# Patient Record
Sex: Female | Born: 1975 | Race: Asian | Hispanic: No | Marital: Single | Smoking: Never smoker
Health system: Southern US, Community
[De-identification: ages and names within clinical notes are randomized; demographics above are authoritative.]

## PROBLEM LIST (undated history)

## (undated) DIAGNOSIS — R569 Unspecified convulsions: Secondary | ICD-10-CM

## (undated) DIAGNOSIS — Q909 Down syndrome, unspecified: Secondary | ICD-10-CM

## (undated) HISTORY — DX: Unspecified convulsions: R56.9

---

## 2014-01-22 ENCOUNTER — Ambulatory Visit: Payer: Self-pay | Admitting: Internal Medicine

## 2016-01-06 DIAGNOSIS — Z0000 Encounter for general adult medical examination without abnormal findings: Secondary | ICD-10-CM | POA: Insufficient documentation

## 2018-03-23 ENCOUNTER — Other Ambulatory Visit: Payer: Self-pay | Admitting: Obstetrics and Gynecology

## 2018-03-23 DIAGNOSIS — Z1231 Encounter for screening mammogram for malignant neoplasm of breast: Secondary | ICD-10-CM

## 2018-04-14 ENCOUNTER — Ambulatory Visit
Admission: RE | Admit: 2018-04-14 | Discharge: 2018-04-14 | Disposition: A | Discharge status: Home / Self Care | Payer: Medicare Other | Source: Ambulatory Visit | Attending: Obstetrics and Gynecology | Admitting: Obstetrics and Gynecology

## 2018-04-14 ENCOUNTER — Encounter: Payer: Self-pay | Admitting: Radiology

## 2018-04-14 DIAGNOSIS — Z1231 Encounter for screening mammogram for malignant neoplasm of breast: Secondary | ICD-10-CM

## 2019-12-14 ENCOUNTER — Ambulatory Visit: Payer: Medicare Other | Attending: Internal Medicine

## 2019-12-14 DIAGNOSIS — Z23 Encounter for immunization: Secondary | ICD-10-CM

## 2019-12-14 NOTE — Progress Notes (Signed)
   Covid-19 Vaccination Clinic  Name:  Anna Banks    MRN: [id number] DOB: 08/11/1976  12/14/2019  [name] was observed post Covid-19 immunization for 15 minutes without incident. She was provided with Vaccine Information Sheet and instruction to access the V-Safe system.   [name] was instructed to call 911 with any severe reactions post vaccine: Marland Kitchen Difficulty breathing  . Swelling of face and throat  . A fast heartbeat  . A bad rash all over body  . Dizziness and weakness   Immunizations Administered    Name Date Dose VIS Date Route   Pfizer COVID-19 Vaccine 12/14/2019 10:47 AM 0.3 mL 08/24/2019 Intramuscular   Manufacturer: [place]   Lot: [id number]   [place]: [id number]

## 2020-01-08 ENCOUNTER — Ambulatory Visit: Payer: Medicare Other | Attending: Internal Medicine

## 2020-01-08 DIAGNOSIS — Z23 Encounter for immunization: Secondary | ICD-10-CM

## 2020-01-08 NOTE — Progress Notes (Signed)
   Covid-19 Vaccination Clinic  Name:  Anna Banks    MRN: [id number] DOB: 26-Mar-1976  01/08/2020  Anna Banks was observed post Covid-19 immunization for 15 minutes without incident. She was provided with Vaccine Information Sheet and instruction to access the V-Safe system.   Anna Banks was instructed to call 911 with any severe reactions post vaccine: Marland Kitchen Difficulty breathing  . Swelling of face and throat  . A fast heartbeat  . A bad rash all over body  . Dizziness and weakness   Immunizations Administered    Name Date Dose VIS Date Route   Pfizer COVID-19 Vaccine 01/08/2020 12:01 PM 0.3 mL 11/07/2018 Intramuscular   Manufacturer: [place]   Lot: [id number]   [place]: [id number]

## 2021-02-11 ENCOUNTER — Other Ambulatory Visit: Payer: Self-pay | Admitting: Internal Medicine

## 2021-02-11 DIAGNOSIS — Z1231 Encounter for screening mammogram for malignant neoplasm of breast: Secondary | ICD-10-CM

## 2021-02-16 ENCOUNTER — Other Ambulatory Visit: Payer: Self-pay

## 2021-02-16 ENCOUNTER — Ambulatory Visit
Admission: RE | Admit: 2021-02-16 | Discharge: 2021-02-16 | Disposition: A | Discharge status: Home / Self Care | Payer: Medicare Other | Source: Ambulatory Visit | Attending: Internal Medicine | Admitting: Internal Medicine

## 2021-02-16 DIAGNOSIS — Z1231 Encounter for screening mammogram for malignant neoplasm of breast: Secondary | ICD-10-CM | POA: Insufficient documentation

## 2021-02-23 ENCOUNTER — Other Ambulatory Visit: Payer: Self-pay | Admitting: Internal Medicine

## 2021-02-23 DIAGNOSIS — R928 Other abnormal and inconclusive findings on diagnostic imaging of breast: Secondary | ICD-10-CM

## 2021-02-23 DIAGNOSIS — N6320 Unspecified lump in the left breast, unspecified quadrant: Secondary | ICD-10-CM

## 2021-02-23 DIAGNOSIS — R921 Mammographic calcification found on diagnostic imaging of breast: Secondary | ICD-10-CM

## 2021-02-26 ENCOUNTER — Ambulatory Visit
Admission: RE | Admit: 2021-02-26 | Discharge: 2021-02-26 | Disposition: A | Discharge status: Home / Self Care | Payer: Medicare Other | Source: Ambulatory Visit | Attending: Internal Medicine | Admitting: Internal Medicine

## 2021-02-26 ENCOUNTER — Other Ambulatory Visit: Payer: Self-pay

## 2021-02-26 DIAGNOSIS — R928 Other abnormal and inconclusive findings on diagnostic imaging of breast: Secondary | ICD-10-CM | POA: Diagnosis not present

## 2021-02-26 DIAGNOSIS — N6320 Unspecified lump in the left breast, unspecified quadrant: Secondary | ICD-10-CM | POA: Insufficient documentation

## 2021-02-26 DIAGNOSIS — R921 Mammographic calcification found on diagnostic imaging of breast: Secondary | ICD-10-CM | POA: Insufficient documentation

## 2021-03-03 ENCOUNTER — Other Ambulatory Visit: Payer: Self-pay | Admitting: Internal Medicine

## 2021-03-03 DIAGNOSIS — N6320 Unspecified lump in the left breast, unspecified quadrant: Secondary | ICD-10-CM

## 2021-03-03 DIAGNOSIS — R928 Other abnormal and inconclusive findings on diagnostic imaging of breast: Secondary | ICD-10-CM

## 2021-03-10 ENCOUNTER — Ambulatory Visit
Admission: RE | Admit: 2021-03-10 | Discharge: 2021-03-10 | Disposition: A | Discharge status: Home / Self Care | Payer: Medicare Other | Source: Ambulatory Visit | Attending: Internal Medicine | Admitting: Internal Medicine

## 2021-03-10 ENCOUNTER — Other Ambulatory Visit: Payer: Self-pay

## 2021-03-10 DIAGNOSIS — N6320 Unspecified lump in the left breast, unspecified quadrant: Secondary | ICD-10-CM | POA: Diagnosis present

## 2021-03-10 DIAGNOSIS — R928 Other abnormal and inconclusive findings on diagnostic imaging of breast: Secondary | ICD-10-CM

## 2021-03-10 HISTORY — PX: BREAST BIOPSY: SHX20

## 2021-03-17 ENCOUNTER — Other Ambulatory Visit: Payer: Self-pay

## 2021-03-17 DIAGNOSIS — C50919 Malignant neoplasm of unspecified site of unspecified female breast: Secondary | ICD-10-CM

## 2021-03-17 LAB — SURGICAL PATHOLOGY

## 2021-03-17 NOTE — Progress Notes (Signed)
Navigation initiated with patient and sister, Suanne Marker.  Scheduled for consult with Dr. Lysle Pearl, and Dr. Tasia Catchings 03/23/21.  Per Electa Sniff at [facility] patient has Down's Syndrome.  Also reports patient has [place].

## 2021-03-23 ENCOUNTER — Ambulatory Visit: Payer: Self-pay | Admitting: Surgery

## 2021-03-23 ENCOUNTER — Inpatient Hospital Stay: Payer: Medicare Other | Attending: Oncology | Admitting: Oncology

## 2021-03-23 ENCOUNTER — Encounter: Payer: Self-pay | Admitting: *Deleted

## 2021-03-23 ENCOUNTER — Encounter: Payer: Self-pay | Admitting: Oncology

## 2021-03-23 ENCOUNTER — Other Ambulatory Visit: Payer: Self-pay | Admitting: Surgery

## 2021-03-23 ENCOUNTER — Inpatient Hospital Stay: Payer: Medicare Other

## 2021-03-23 VITALS — BP 111/72 | HR 73 | Temp 97.9°F | Resp 18 | Wt 119.4 lb

## 2021-03-23 DIAGNOSIS — C50412 Malignant neoplasm of upper-outer quadrant of left female breast: Secondary | ICD-10-CM | POA: Diagnosis present

## 2021-03-23 DIAGNOSIS — Z170 Estrogen receptor positive status [ER+]: Secondary | ICD-10-CM

## 2021-03-23 DIAGNOSIS — Z7189 Other specified counseling: Secondary | ICD-10-CM | POA: Insufficient documentation

## 2021-03-23 DIAGNOSIS — C50919 Malignant neoplasm of unspecified site of unspecified female breast: Secondary | ICD-10-CM | POA: Insufficient documentation

## 2021-03-23 DIAGNOSIS — C50012 Malignant neoplasm of nipple and areola, left female breast: Secondary | ICD-10-CM

## 2021-03-23 LAB — COMPREHENSIVE METABOLIC PANEL
ALT: 18 U/L (ref 0–44)
AST: 25 U/L (ref 15–41)
Albumin: 4.4 g/dL (ref 3.5–5.0)
Alkaline Phosphatase: 48 U/L (ref 38–126)
Anion gap: 7 (ref 5–15)
BUN: 10 mg/dL (ref 6–20)
CO2: 25 mmol/L (ref 22–32)
Calcium: 9.4 mg/dL (ref 8.9–10.3)
Chloride: 105 mmol/L (ref 98–111)
Creatinine, Ser: 0.58 mg/dL (ref 0.44–1.00)
GFR, Estimated: >60 mL/min (ref >60)
Glucose, Bld: 99 mg/dL (ref 70–99)
Potassium: 4 mmol/L (ref 3.5–5.1)
Sodium: 137 mmol/L (ref 135–145)
Total Bilirubin: 0.5 mg/dL (ref 0.3–1.2)
Total Protein: 8.2 g/dL — ABNORMAL HIGH (ref 6.5–8.1)

## 2021-03-23 LAB — CBC WITH DIFFERENTIAL/PLATELET
Abs Immature Granulocytes: 0.01 10*3/uL (ref 0.00–0.07)
Basophils Absolute: 0.1 10*3/uL (ref 0.0–0.1)
Basophils Relative: 1 %
Eosinophils Absolute: 0.2 10*3/uL (ref 0.0–0.5)
Eosinophils Relative: 3 %
HCT: 43.2 % (ref 36.0–46.0)
Hemoglobin: 14.4 g/dL (ref 12.0–15.0)
Immature Granulocytes: 0 %
Lymphocytes Relative: 29 %
Lymphs Abs: 1.4 10*3/uL (ref 0.7–4.0)
MCH: 31.6 pg (ref 26.0–34.0)
MCHC: 33.3 g/dL (ref 30.0–36.0)
MCV: 94.9 fL (ref 80.0–100.0)
Monocytes Absolute: 0.4 10*3/uL (ref 0.1–1.0)
Monocytes Relative: 8 %
Neutro Abs: 2.9 10*3/uL (ref 1.7–7.7)
Neutrophils Relative %: 59 %
Platelets: 320 10*3/uL (ref 150–400)
RBC: 4.55 MIL/uL (ref 3.87–5.11)
RDW: 12.5 % (ref 11.5–15.5)
WBC: 4.9 10*3/uL (ref 4.0–10.5)
nRBC: 0 % (ref 0.0–0.2)

## 2021-03-23 NOTE — H&P (View-Only) (Signed)
Subjective:   CC: Malignant neoplasm of upper-outer quadrant of left breast in female, estrogen receptor positive (CMS-HCC) [C50.412, Z17.0] HPI:  Anna Banks is a 45 y.o. female who was referred by Garth Schlatter* for evaluation of above. Change was noted on last screening mammogram. Patient does not routinely do self breast exams.   Age of menarche was 89.   Last menstrual period was few weeks ago. Patient denies hormonal therapy. Patient is G0P0.  Patient denies nipple discharge. Patient denies previous breast biopsy. Patient denies a personal history of breast cancer.   Past Medical History:  has a past medical history of Developmental disability and Seizures (CMS-HCC).  Past Surgical History:  has no past surgical history on file.  Family History: family history includes Hyperlipidemia (Elevated cholesterol) in her mother; No Known Problems in her father and sister.  Social History:  reports that she has never smoked. She has never used smokeless tobacco. She reports that she does not drink alcohol and does not use drugs.  Current Medications: has a current medication list which includes the following prescription(s): cholecalciferol and ethosuximide.  Allergies:  Allergies as of 03/23/2021   (No Known Allergies)    ROS:  A 15 point review of systems was performed and was negative except as noted in HPI   Objective:     BP 129/76   Pulse 81   Ht 154.9 cm (5' 1" )   Wt 52.2 kg (115 lb)   BMI 21.73 kg/m   Constitutional :  alert, appears stated age, cooperative and no distress  Lymphatics/Throat:  no asymmetry, masses, or scars  Respiratory:  clear to auscultation bilaterally  Cardiovascular:  regular rate and rhythm  Gastrointestinal: soft, non-tender; bowel sounds normal; no masses,  no organomegaly.   Musculoskeletal: Steady gait and movement  Skin: Cool and moist,  Psychiatric: Normal affect, non-agitated, not confused  Breast:  Chaperone present for exam.   breasts appear normal, no suspicious masses, no skin or nipple changes or axillary nodes.    LABS:  Reason for Addendum #1:  Breast Biomarker Results   Specimen Submitted:  A. Breast, left; biopsy   Clinical History: Indeterminate mass left breast at 2 o'clock 8 cm from  nipple, 1.0 cm. Carcinoma vs benign.  Post biopsy mammograms show appropriate positioning of the ribbon shaped  biopsy marking clip at the site of biopsy within the mass in the upper  outer left breast.      DIAGNOSIS:  A. BREAST, LEFT, 2 O'CLOCK 8 CM FROM NIPPLE; ULTRASOUND-GUIDED CORE  BIOPSY:  - INVASIVE MAMMARY CARCINOMA, NO SPECIAL TYPE.   Size of invasive carcinoma: 6 mm in this sample  Histologic grade of invasive carcinoma: Grade 2                       Glandular/tubular differentiation score: 3                       Nuclear pleomorphism score: 2                       Mitotic rate score: 2                       Total score: 7  Ductal carcinoma in situ: Present, with central necrosis  Lymphovascular invasion: Not identified   ER/PR/HER2: Immunohistochemistry will be performed on block A1, with  reflex to [place] for HER2 2+.  The results will be reported in an addendum.   Comment:  The definitive grade will be assigned on the excisional specimen.   GROSS DESCRIPTION:  A. Labeled: Left ultrasound biopsy 2:00 8 cm from nipple  Received: Formalin  Time/date in fixative: Collected and placed in formalin at 1:11 PM on  03/10/2021  Cold ischemic time: Less than 1 minute  Total fixation time: Approximately 6.5 hours  Core pieces: 3 cores and 1 additional fragment  Size: Range from 1.6-2.2 cm in length and 0.2 cm in diameter  Description: Received are cores and a fragment of yellow fibrofatty  tissue.  The additional fragment is 0.3 x 0.2 x 0.1 cm.  Ink color: Black  Entirely submitted in cassettes 1-2 with 2 cores in cassette 1 and 1  core with the remaining fragment in cassette 2.    RB 03/10/2021     Final Diagnosis performed by Bryan Lemma, MD.   Electronically signed  03/11/2021 1:01:07PM  The electronic signature indicates that the named Attending Pathologist  has evaluated the specimen  Technical component performed at [facility], [address] [place],  [place] [address] Lab: [phone] Dir: Rush Farmer, MD, MMM   Professional component performed at [facility], [facility], [place], [place], [place] [address] Lab: [phone]  Dir: Dellia Nims. Rubinas, MD   ADDENDUM:  CASE SUMMARY: BREAST BIOMARKER TESTS  Estrogen Receptor (ER) Status: LOW POSITIVE                       Percentage of cells with nuclear positivity: 1-10%                       Average intensity of staining: Weak                       A second pathologist review was performed by Dr.  Dessie Coma.                       Internal control cells: No internal controls are  present, but external controls are appropriately positive.  If needed,  testing another specimen that contains internal controls may be  warranted for confirmation of the ER status.  Comment: The cancer in this sample has a low level (1-10%) of ER  expression by IHC.  There are limited data on the overall benefit of  endocrine therapies for patients with low level (1-10%) ER expression,  but they currently suggest possible benefit, so patients are considered  eligible for endocrine treatment.  There are data that suggest invasive  cancers with these results are heterogenous in both behavior and biology  and often have gene expression profiles more similar to ER negative  cancers.   Progesterone Receptor (PgR) Status: POSITIVE          Percentage of cells with nuclear positivity: 1-10%          Average intensity of staining: Moderate   HER2 (by immunohistochemistry): POSITIVE (Score 3+)       Percentage of cells with uniform intense complete membrane  staining: Greater than 90%   Cold Ischemia and Fixation Times: Meet  requirements specified in latest  version of the ASCO/CAP guidelines  Testing Performed on Block Number(s): A1   METHODS  Fixative: Formalin  Estrogen Receptor:  FDA cleared ([place]) Primary Antibody:  SP1  Progesterone Receptor: FDA cleared ([place]) Primary Antibody: 1E2  HER2 (by IHC): FDA approved (  [place]) Primary Antibody: 4B5 (PATHWAY)  Immunohistochemistry controls worked appropriately. Slides were prepared  by [facility], [place], [place], and interpreted by Dr. Dicie Beam.     Addendum #1 performed by Bryan Lemma, MD.   Electronically signed  03/17/2021 2:16:02PM  The electronic signature indicates that the named Attending Pathologist  has evaluated the specimen  Technical component performed at [facility], [address] [place],  [place] [address] Lab: [phone] Dir: Rush Farmer, MD, MMM   Professional component performed at [facility], [facility], [place], [place], [place] [address] Lab: [phone]  Dir: Dellia Nims. Rubinas, MD   RADS: CLINICAL DATA:  Callback for RIGHT breast calcifications, LEFT  breast mass and possible distortion   EXAM:  DIGITAL DIAGNOSTIC BILATERAL MAMMOGRAM WITH TOMOSYNTHESIS AND CAD;  ULTRASOUND LEFT BREAST LIMITED   TECHNIQUE:  Bilateral digital diagnostic mammography and breast tomosynthesis  was performed. The images were evaluated with computer-aided  detection.; Targeted ultrasound examination of the left breast was  performed   COMPARISON:  Previous exam(s).   ACR Breast Density Category d: The breast tissue is extremely dense,  which lowers the sensitivity of mammography.   FINDINGS:  RIGHT breast:   Spot magnification of the RIGHT breast demonstrate scattered and  occasionally loosely grouped punctate calcifications. No suspicious  grouping or morphology. No suspicious mass, distortion, or  microcalcifications are identified to suggest presence of  malignancy.   LEFT breast:   The previously  described architectural distortion does not persist  with additional views, consistent with superimposed fibroglandular  tissue.   In the LEFT upper outer breast at posterior depth, there is an oval  lobulated mass which is not definitively unchanged since prior  mammogram from 2019.   Targeted ultrasound was performed of the LEFT upper outer breast. At  2 o'clock 8 cm from the nipple, there is an oval hypoechoic mass  with mildly angular margins. It measures 10 x 7 x 6 mm and  corresponds to the site of screening mammographic concern.   Targeted ultrasound was performed of the LEFT breast at site of  questioned distortion. No suspicious cystic or solid mass is seen.   Targeted ultrasound was performed of the LEFT axilla. No suspicious  axillary lymph nodes are seen.   IMPRESSION:  1. There is an indeterminate 10 mm LEFT breast mass. Recommend  ultrasound-guided biopsy for definitive characterization.  2. No mammographic evidence of malignancy in the RIGHT breast.  3. No LEFT axillary adenopathy.   RECOMMENDATION:  LEFT breast ultrasound-guided biopsy x1   I have discussed the findings and recommendations with the patient  with the assistance of a video interpreter. If applicable, a  reminder letter will be sent to the patient regarding the next  appointment. Patient will be scheduled for biopsy at her earliest  convenience by the schedulers and ordering provider will be  notified.   BI-RADS CATEGORY  4: Suspicious.    Assessment:   Malignant neoplasm of upper-outer quadrant of left breast in female, estrogen receptor positive (CMS-HCC) [C50.412, Z17.0]  Plan:     1. Malignant neoplasm of upper-outer quadrant of left breast in female, estrogen receptor positive (CMS-HCC) [C50.412, Z17.0]  Discussed the risk of surgery including recurrence, chronic pain, post-op infxn, poor/delayed wound healing, poor cosmesis, seroma, hematoma formation, and possible re-operation to  address said risks. The risks of general anesthetic, if used, includes MI, CVA, sudden death or even reaction to anesthetic medications also discussed.  Typical post-op recovery time and possbility of  activity restrictions were also discussed.  Alternatives include continued observation.  Benefits include possible symptom relief, pathologic evaluation, and/or curative excision.   The patient verbalized understanding and all questions were answered to the patient's satisfaction.  2. Patient has elected to proceed with surgical treatment. Procedure will be scheduled.  Written consent was obtained. SLNB and RF tag placement prior to surgery.

## 2021-03-23 NOTE — H&P (Signed)
Subjective:   CC: Malignant neoplasm of upper-outer quadrant of left breast in female, estrogen receptor positive (CMS-HCC) [C50.412, Z17.0] HPI:  Anna Banks is a 45 y.o. female who was referred by Garth Schlatter* for evaluation of above. Change was noted on last screening mammogram. Patient does not routinely do self breast exams.   Age of menarche was 33.   Last menstrual period was few weeks ago. Patient denies hormonal therapy. Patient is G0P0.  Patient denies nipple discharge. Patient denies previous breast biopsy. Patient denies a personal history of breast cancer.   Past Medical History:  has a past medical history of Developmental disability and Seizures (CMS-HCC).  Past Surgical History:  has no past surgical history on file.  Family History: family history includes Hyperlipidemia (Elevated cholesterol) in her mother; No Known Problems in her father and sister.  Social History:  reports that she has never smoked. She has never used smokeless tobacco. She reports that she does not drink alcohol and does not use drugs.  Current Medications: has a current medication list which includes the following prescription(s): cholecalciferol and ethosuximide.  Allergies:  Allergies as of 03/23/2021   (No Known Allergies)    ROS:  A 15 point review of systems was performed and was negative except as noted in HPI   Objective:     BP 129/76   Pulse 81   Ht 154.9 cm (5' 1" )   Wt 52.2 kg (115 lb)   BMI 21.73 kg/m   Constitutional :  alert, appears stated age, cooperative and no distress  Lymphatics/Throat:  no asymmetry, masses, or scars  Respiratory:  clear to auscultation bilaterally  Cardiovascular:  regular rate and rhythm  Gastrointestinal: soft, non-tender; bowel sounds normal; no masses,  no organomegaly.   Musculoskeletal: Steady gait and movement  Skin: Cool and moist,  Psychiatric: Normal affect, non-agitated, not confused  Breast:  Chaperone present for exam.   breasts appear normal, no suspicious masses, no skin or nipple changes or axillary nodes.    LABS:  Reason for Addendum #1:  Breast Biomarker Results   Specimen Submitted:  A. Breast, left; biopsy   Clinical History: Indeterminate mass left breast at 2 o'clock 8 cm from  nipple, 1.0 cm. Carcinoma vs benign.  Post biopsy mammograms show appropriate positioning of the ribbon shaped  biopsy marking clip at the site of biopsy within the mass in the upper  outer left breast.      DIAGNOSIS:  A. BREAST, LEFT, 2 O'CLOCK 8 CM FROM NIPPLE; ULTRASOUND-GUIDED CORE  BIOPSY:  - INVASIVE MAMMARY CARCINOMA, NO SPECIAL TYPE.   Size of invasive carcinoma: 6 mm in this sample  Histologic grade of invasive carcinoma: Grade 2                       Glandular/tubular differentiation score: 3                       Nuclear pleomorphism score: 2                       Mitotic rate score: 2                       Total score: 7  Ductal carcinoma in situ: Present, with central necrosis  Lymphovascular invasion: Not identified   ER/PR/HER2: Immunohistochemistry will be performed on block A1, with  reflex to [place] for HER2 2+.  The results will be reported in an addendum.   Comment:  The definitive grade will be assigned on the excisional specimen.   GROSS DESCRIPTION:  A. Labeled: Left ultrasound biopsy 2:00 8 cm from nipple  Received: Formalin  Time/date in fixative: Collected and placed in formalin at 1:11 PM on  03/10/2021  Cold ischemic time: Less than 1 minute  Total fixation time: Approximately 6.5 hours  Core pieces: 3 cores and 1 additional fragment  Size: Range from 1.6-2.2 cm in length and 0.2 cm in diameter  Description: Received are cores and a fragment of yellow fibrofatty  tissue.  The additional fragment is 0.3 x 0.2 x 0.1 cm.  Ink color: Black  Entirely submitted in cassettes 1-2 with 2 cores in cassette 1 and 1  core with the remaining fragment in cassette 2.    RB 03/10/2021     Final Diagnosis performed by Bryan Lemma, MD.   Electronically signed  03/11/2021 1:01:07PM  The electronic signature indicates that the named Attending Pathologist  has evaluated the specimen  Technical component performed at [facility], [address] [place],  [place] [address] Lab: [phone] Dir: Rush Farmer, MD, MMM   Professional component performed at [facility], [facility], [place], [place], [place] [address] Lab: [phone]  Dir: Dellia Nims. Rubinas, MD   ADDENDUM:  CASE SUMMARY: BREAST BIOMARKER TESTS  Estrogen Receptor (ER) Status: LOW POSITIVE                       Percentage of cells with nuclear positivity: 1-10%                       Average intensity of staining: Weak                       A second pathologist review was performed by Dr.  Dessie Coma.                       Internal control cells: No internal controls are  present, but external controls are appropriately positive.  If needed,  testing another specimen that contains internal controls may be  warranted for confirmation of the ER status.  Comment: The cancer in this sample has a low level (1-10%) of ER  expression by IHC.  There are limited data on the overall benefit of  endocrine therapies for patients with low level (1-10%) ER expression,  but they currently suggest possible benefit, so patients are considered  eligible for endocrine treatment.  There are data that suggest invasive  cancers with these results are heterogenous in both behavior and biology  and often have gene expression profiles more similar to ER negative  cancers.   Progesterone Receptor (PgR) Status: POSITIVE          Percentage of cells with nuclear positivity: 1-10%          Average intensity of staining: Moderate   HER2 (by immunohistochemistry): POSITIVE (Score 3+)       Percentage of cells with uniform intense complete membrane  staining: Greater than 90%   Cold Ischemia and Fixation Times: Meet  requirements specified in latest  version of the ASCO/CAP guidelines  Testing Performed on Block Number(s): A1   METHODS  Fixative: Formalin  Estrogen Receptor:  FDA cleared ([place]) Primary Antibody:  SP1  Progesterone Receptor: FDA cleared ([place]) Primary Antibody: 1E2  HER2 (by IHC): FDA approved (  [place]) Primary Antibody: 4B5 (PATHWAY)  Immunohistochemistry controls worked appropriately. Slides were prepared  by [facility], [place], [place], and interpreted by Dr. Dicie Beam.     Addendum #1 performed by Bryan Lemma, MD.   Electronically signed  03/17/2021 2:16:02PM  The electronic signature indicates that the named Attending Pathologist  has evaluated the specimen  Technical component performed at [facility], [address] [place],  [place] [address] Lab: [phone] Dir: Rush Farmer, MD, MMM   Professional component performed at [facility], [facility], [place], [place], [place] [address] Lab: [phone]  Dir: Dellia Nims. Rubinas, MD   RADS: CLINICAL DATA:  Callback for RIGHT breast calcifications, LEFT  breast mass and possible distortion   EXAM:  DIGITAL DIAGNOSTIC BILATERAL MAMMOGRAM WITH TOMOSYNTHESIS AND CAD;  ULTRASOUND LEFT BREAST LIMITED   TECHNIQUE:  Bilateral digital diagnostic mammography and breast tomosynthesis  was performed. The images were evaluated with computer-aided  detection.; Targeted ultrasound examination of the left breast was  performed   COMPARISON:  Previous exam(s).   ACR Breast Density Category d: The breast tissue is extremely dense,  which lowers the sensitivity of mammography.   FINDINGS:  RIGHT breast:   Spot magnification of the RIGHT breast demonstrate scattered and  occasionally loosely grouped punctate calcifications. No suspicious  grouping or morphology. No suspicious mass, distortion, or  microcalcifications are identified to suggest presence of  malignancy.   LEFT breast:   The previously  described architectural distortion does not persist  with additional views, consistent with superimposed fibroglandular  tissue.   In the LEFT upper outer breast at posterior depth, there is an oval  lobulated mass which is not definitively unchanged since prior  mammogram from 2019.   Targeted ultrasound was performed of the LEFT upper outer breast. At  2 o'clock 8 cm from the nipple, there is an oval hypoechoic mass  with mildly angular margins. It measures 10 x 7 x 6 mm and  corresponds to the site of screening mammographic concern.   Targeted ultrasound was performed of the LEFT breast at site of  questioned distortion. No suspicious cystic or solid mass is seen.   Targeted ultrasound was performed of the LEFT axilla. No suspicious  axillary lymph nodes are seen.   IMPRESSION:  1. There is an indeterminate 10 mm LEFT breast mass. Recommend  ultrasound-guided biopsy for definitive characterization.  2. No mammographic evidence of malignancy in the RIGHT breast.  3. No LEFT axillary adenopathy.   RECOMMENDATION:  LEFT breast ultrasound-guided biopsy x1   I have discussed the findings and recommendations with the patient  with the assistance of a video interpreter. If applicable, a  reminder letter will be sent to the patient regarding the next  appointment. Patient will be scheduled for biopsy at her earliest  convenience by the schedulers and ordering provider will be  notified.   BI-RADS CATEGORY  4: Suspicious.    Assessment:   Malignant neoplasm of upper-outer quadrant of left breast in female, estrogen receptor positive (CMS-HCC) [C50.412, Z17.0]  Plan:     1. Malignant neoplasm of upper-outer quadrant of left breast in female, estrogen receptor positive (CMS-HCC) [C50.412, Z17.0]  Discussed the risk of surgery including recurrence, chronic pain, post-op infxn, poor/delayed wound healing, poor cosmesis, seroma, hematoma formation, and possible re-operation to  address said risks. The risks of general anesthetic, if used, includes MI, CVA, sudden death or even reaction to anesthetic medications also discussed.  Typical post-op recovery time and possbility of  activity restrictions were also discussed.  Alternatives include continued observation.  Benefits include possible symptom relief, pathologic evaluation, and/or curative excision.   The patient verbalized understanding and all questions were answered to the patient's satisfaction.  2. Patient has elected to proceed with surgical treatment. Procedure will be scheduled.  Written consent was obtained. SLNB and RF tag placement prior to surgery.

## 2021-03-23 NOTE — Progress Notes (Signed)
Hematology/Oncology Consult note [facility] [phone] [phone]   Patient Care Team: Kirk Ruths, MD as PCP - General (Internal Medicine)  REFERRING PROVIDER: Kirk Ruths, MD  CHIEF COMPLAINTS/REASON FOR VISIT:  Evaluation of breast cancer  HISTORY OF PRESENTING ILLNESS:   Anna Banks is a  45 y.o.  female with PMH listed below was seen in consultation at the request of  Kirk Ruths, MD  for evaluation of breast cancer.   Patient was accompanied by her half sister today. Per sister, patient has history of seizure since childhood and has intellectual/cognitive impairment. She is illiterate. She follows instructions from her family members.  She lives with her mother and her half sister who takes of her and helps her to make medical decision.   They waived interpretor. Both patient and her half sister speak [place].  Menarche 60 years of age. G0P0. No child, no previous pregnancy. Per her sister, patient is not sexually active No prior use of birth control pills or hormone replacement therapy. Denies previous history of biopsy.  02/16/2021, screening mammogram showed calcification in the right breast needs further evaluation.  Left breast possible mass with distortion requires further evaluation. 02/26/2021, left breast showed a 10 mm indeterminate mass, 2:00, 8 cm from nipple.  No mammographic evidence of malignancy in the right breast.  No left axillary lymphadenopathy. 03/10/2021, patient underwent ultrasound-guided biopsy of left breast mass.  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  ER low positive [1-10%], PR positive [1-10%], HER2 positive by IHC 3+  Patient has met surgeon Dr. Lysle Pearl today.  She presents to establish care with oncology.  Review of Systems  Constitutional:  Negative for appetite change, chills, fatigue and fever.   HENT:   Negative for hearing loss and voice change.   Eyes:  Negative for eye problems.  Respiratory:  Negative for chest tightness and cough.   Cardiovascular:  Negative for chest pain.  Gastrointestinal:  Negative for abdominal distention, abdominal pain and blood in stool.  Endocrine: Negative for hot flashes.  Genitourinary:  Negative for difficulty urinating and frequency.   Musculoskeletal:  Negative for arthralgias.  Skin:  Negative for itching and rash.  Neurological:  Negative for extremity weakness.  Hematological:  Negative for adenopathy.  Psychiatric/Behavioral:  Negative for confusion.    MEDICAL HISTORY:  Past Medical History:  Diagnosis Date   Seizure [facility])     SURGICAL HISTORY: Past Surgical History:  Procedure Laterality Date   BREAST BIOPSY Left 03/10/2021   [place] BX,ribbon clip,  path pending    SOCIAL HISTORY: Social History   Socioeconomic History   Marital status: Single    Spouse name: Not on file   Number of children: Not on file   Years of education: Not on file   Highest education level: Not on file  Occupational History   Not on file  Tobacco Use   Smoking status: Never   Smokeless tobacco: Never  Vaping Use   Vaping Use: Never used  Substance and Sexual Activity   Alcohol use: Never   Drug use: Not Currently   Sexual activity: Not on file  Other Topics Concern   Not on file  Social History Narrative   Not on file   Social Determinants of Health   Financial Resource Strain: Not on file  Food Insecurity: Not on file  Transportation Needs: Not on file  Physical Activity: Not  on file  Stress: Not on file  Social Connections: Not on file  Intimate Partner Violence: Not on file    FAMILY HISTORY: History reviewed. No pertinent family history.  ALLERGIES:  has no allergies on file.  MEDICATIONS:  Current Outpatient Medications  Medication Sig Dispense Refill   Cholecalciferol 25 MCG (1000 UT) tablet Take 1 tablet by mouth  daily.     ethosuximide (ZARONTIN) 250 MG capsule Take 500 mg by mouth daily.     No current facility-administered medications for this visit.     PHYSICAL EXAMINATION: ECOG PERFORMANCE STATUS: 0 - Asymptomatic Vitals:   03/23/21 0921  BP: 111/72  Pulse: 73  Resp: 18  Temp: 97.9 F (36.6 C)   Filed Weights   03/23/21 0921  Weight: 119 lb 6.4 oz (54.2 kg)    Physical Exam Constitutional:      General: She is not in acute distress. HENT:     Head: Normocephalic and atraumatic.  Eyes:     General: No scleral icterus. Cardiovascular:     Rate and Rhythm: Normal rate and regular rhythm.     Heart sounds: Normal heart sounds.  Pulmonary:     Effort: Pulmonary effort is normal. No respiratory distress.     Breath sounds: No wheezing.  Abdominal:     General: Bowel sounds are normal. There is no distension.     Palpations: Abdomen is soft.  Musculoskeletal:        General: No deformity. Normal range of motion.     Cervical back: Normal range of motion and neck supple.  Skin:    General: Skin is warm and dry.     Findings: No erythema or rash.  Neurological:     Mental Status: She is alert. Mental status is at baseline.     Cranial Nerves: No cranial nerve deficit.     Coordination: Coordination normal.  Psychiatric:        Mood and Affect: Mood normal.  Breast exam was performed in seated and lying down position. No palpable mass on the right breast.  No discrete palpable mass on left breast.  No palpable lymphadenopathy bilaterally .   LABORATORY DATA:  I have reviewed the data as listed Lab Results  Component Value Date   WBC 4.9 03/23/2021   HGB 14.4 03/23/2021   HCT 43.2 03/23/2021   MCV 94.9 03/23/2021   PLT 320 03/23/2021   Recent Labs    03/23/21 1006  NA 137  K 4.0  CL 105  CO2 25  GLUCOSE 99  BUN 10  CREATININE 0.58  CALCIUM 9.4  GFRNONAA >60  PROT 8.2*  ALBUMIN 4.4  AST 25  ALT 18  ALKPHOS 48  BILITOT 0.5   Iron/TIBC/Ferritin/  %Sat No results found for: IRON, TIBC, FERRITIN, IRONPCTSAT    RADIOGRAPHIC STUDIES: I have personally reviewed the radiological images as listed and agreed with the findings in the report. US BREAST LTD UNI LEFT INC AXILLA  Result Date: 02/26/2021 CLINICAL DATA:  Callback for RIGHT breast calcifications, LEFT breast mass and possible distortion EXAM: DIGITAL DIAGNOSTIC BILATERAL MAMMOGRAM WITH TOMOSYNTHESIS AND CAD; ULTRASOUND LEFT BREAST LIMITED TECHNIQUE: Bilateral digital diagnostic mammography and breast tomosynthesis was performed. The images were evaluated with computer-aided detection.; Targeted ultrasound examination of the left breast was performed COMPARISON:  Previous exam(s). ACR Breast Density Category d: The breast tissue is extremely dense, which lowers the sensitivity of mammography. FINDINGS: RIGHT breast: Spot magnification of the RIGHT breast demonstrate scattered and  occasionally loosely grouped punctate calcifications. No suspicious grouping or morphology. No suspicious mass, distortion, or microcalcifications are identified to suggest presence of malignancy. LEFT breast: The previously described architectural distortion does not persist with additional views, consistent with superimposed fibroglandular tissue. In the LEFT upper outer breast at posterior depth, there is an oval lobulated mass which is not definitively unchanged since prior mammogram from 2019. Targeted ultrasound was performed of the LEFT upper outer breast. At 2 o'clock 8 cm from the nipple, there is an oval hypoechoic mass with mildly angular margins. It measures 10 x 7 x 6 mm and corresponds to the site of screening mammographic concern. Targeted ultrasound was performed of the LEFT breast at site of questioned distortion. No suspicious cystic or solid mass is seen. Targeted ultrasound was performed of the LEFT axilla. No suspicious axillary lymph nodes are seen. IMPRESSION: 1. There is an indeterminate 10 mm LEFT  breast mass. Recommend ultrasound-guided biopsy for definitive characterization. 2. No mammographic evidence of malignancy in the RIGHT breast. 3. No LEFT axillary adenopathy. RECOMMENDATION: LEFT breast ultrasound-guided biopsy x1 I have discussed the findings and recommendations with the patient with the assistance of a video interpreter. If applicable, a reminder letter will be sent to the patient regarding the next appointment. Patient will be scheduled for biopsy at her earliest convenience by the schedulers and ordering provider will be notified. BI-RADS CATEGORY  4: Suspicious. Electronically Signed   By: Valentino Saxon MD   On: 02/26/2021 15:49  MM DIAG BREAST TOMO BILATERAL  Result Date: 02/26/2021 CLINICAL DATA:  Callback for RIGHT breast calcifications, LEFT breast mass and possible distortion EXAM: DIGITAL DIAGNOSTIC BILATERAL MAMMOGRAM WITH TOMOSYNTHESIS AND CAD; ULTRASOUND LEFT BREAST LIMITED TECHNIQUE: Bilateral digital diagnostic mammography and breast tomosynthesis was performed. The images were evaluated with computer-aided detection.; Targeted ultrasound examination of the left breast was performed COMPARISON:  Previous exam(s). ACR Breast Density Category d: The breast tissue is extremely dense, which lowers the sensitivity of mammography. FINDINGS: RIGHT breast: Spot magnification of the RIGHT breast demonstrate scattered and occasionally loosely grouped punctate calcifications. No suspicious grouping or morphology. No suspicious mass, distortion, or microcalcifications are identified to suggest presence of malignancy. LEFT breast: The previously described architectural distortion does not persist with additional views, consistent with superimposed fibroglandular tissue. In the LEFT upper outer breast at posterior depth, there is an oval lobulated mass which is not definitively unchanged since prior mammogram from 2019. Targeted ultrasound was performed of the LEFT upper outer breast. At  2 o'clock 8 cm from the nipple, there is an oval hypoechoic mass with mildly angular margins. It measures 10 x 7 x 6 mm and corresponds to the site of screening mammographic concern. Targeted ultrasound was performed of the LEFT breast at site of questioned distortion. No suspicious cystic or solid mass is seen. Targeted ultrasound was performed of the LEFT axilla. No suspicious axillary lymph nodes are seen. IMPRESSION: 1. There is an indeterminate 10 mm LEFT breast mass. Recommend ultrasound-guided biopsy for definitive characterization. 2. No mammographic evidence of malignancy in the RIGHT breast. 3. No LEFT axillary adenopathy. RECOMMENDATION: LEFT breast ultrasound-guided biopsy x1 I have discussed the findings and recommendations with the patient with the assistance of a video interpreter. If applicable, a reminder letter will be sent to the patient regarding the next appointment. Patient will be scheduled for biopsy at her earliest convenience by the schedulers and ordering provider will be notified. BI-RADS CATEGORY  4: Suspicious. Electronically Signed   By: Colletta Maryland  Peacock MD   On: 02/26/2021 15:49  MM CLIP PLACEMENT LEFT  Result Date: 03/10/2021 CLINICAL DATA:  Evaluate post biopsy marker clip following ultrasound-guided core needle biopsy of a left breast mass. EXAM: 3D DIAGNOSTIC LEFT MAMMOGRAM POST ULTRASOUND BIOPSY COMPARISON:  Previous exam(s). FINDINGS: 3D Mammographic images were obtained following ultrasound guided biopsy of a left breast mass. The biopsy marking clip is in expected position at the site of biopsy. IMPRESSION: Appropriate positioning of the ribbon shaped biopsy marking clip at the site of biopsy in the within the mass in the upper outer left breast. Final Assessment: Post Procedure Mammograms for Marker Placement Electronically Signed   By: Lajean Manes M.D.   On: 03/10/2021 13:33  [place] LT BREAST BX W LOC DEV 1ST LESION IMG BX SPEC US GUIDE  Addendum Date: 03/12/2021    ADDENDUM REPORT: 03/12/2021 10:01 ADDENDUM: PATHOLOGY revealed: A. BREAST, LEFT, 2 O'CLOCK 8 CM FROM NIPPLE; ULTRASOUND-GUIDED CORE BIOPSY: - INVASIVE MAMMARY CARCINOMA, NO SPECIAL TYPE. Size of invasive carcinoma: 6 mm in this sample. Histologic grade of invasive carcinoma: Grade 2. Ductal carcinoma in situ: Present, with central necrosis. Lymphovascular invasion: Not identified. Pathology results are CONCORDANT with imaging findings, per Dr. Lajean Manes. Pathology results and recommendations were discussed with patient and family via telephone on 03/12/2021. [employer] [employer] unable to provide [place], but I was able to communicate with patient via her sister Suanne Marker) and husband (Ron). Suanne Marker reported patient doing well after the biopsy with no adverse symptoms, only slight itching at the site. Post biopsy care instructions were reviewed, questions were answered and my direct phone number was provided. Patient's family was instructed to call [facility] for any additional questions or concerns related to biopsy site. Recommend surgical consultation: Request for surgical consultation relayed to Al Pimple RN and Tanya Nones RN at [facility] by Electa Sniff RN on 03/12/2021. All information provided to nurse navigators regarding optimal mode of communication with this patient and family. Pathology results reported by Electa Sniff RN on 03/12/2021. Electronically Signed   By: Lajean Manes M.D.   On: 03/12/2021 10:01   Result Date: 03/12/2021 CLINICAL DATA:  Patient presents for ultrasound-guided core needle biopsy of a mass in the upper outer left breast. EXAM: ULTRASOUND GUIDED LEFT BREAST CORE NEEDLE BIOPSY COMPARISON:  Previous exam(s). PROCEDURE: I met with the patient and we discussed the procedure of ultrasound-guided biopsy, including benefits and alternatives. We discussed the high likelihood of a successful procedure. We discussed the risks of the  procedure, including infection, bleeding, tissue injury, clip migration, and inadequate sampling. Informed written consent was given. The usual time-out protocol was performed immediately prior to the procedure. Lesion quadrant: Upper outer quadrant Using sterile technique and 1% Lidocaine as local anesthetic, under direct ultrasound visualization, a 12 gauge spring-loaded device was used to perform biopsy of the 1 cm mass in the left breast at 2 o'clock, 8 cm the nipple, using a inferolateral approach. At the conclusion of the procedure ribbon shaped tissue marker clip was deployed into the biopsy cavity. Follow up 2 view mammogram was performed and dictated separately. IMPRESSION: Ultrasound guided biopsy of a left breast mass. No apparent complications. Electronically Signed: By: Lajean Manes M.D. On: 03/10/2021 13:20     ASSESSMENT & PLAN:  1. Invasive carcinoma of breast ([place])   2. Goals of care, counseling/discussion    #Left invasive carcinoma of breast, ER/PR weakly positive, HER2 positive. Cancer Staging Invasive carcinoma of breast ([place])  Staging form: Breast, AJCC 8th Edition - Clinical stage from 03/23/2021: Stage IA (cT1b, cN0, cM0, G2, ER+, PR+, HER2+) - Signed by Earlie Server, MD on 03/23/2021  HER2 positive breast cancer, <= 1 cm and node negative. Recommend upfront surgery. Patient will need adjuvant chemotherapy, most likely Taxol with Transtuzumab  followed by 1 year of Transtuzumab treatment. She will nee echo prior to treatment.  Will further discussed with patient and her family member after reviewing her's final surgery pathology specimen report.  Patient will also need adjuvant radiation.  Discussed in details.  I will see patient 2 weeks after her surgery.  Orders Placed This Encounter  Procedures   CBC with Differential/Platelet    Standing Status:   Future    Number of Occurrences:   1    Standing Expiration Date:   03/23/2022   Comprehensive metabolic panel     Standing Status:   Future    Number of Occurrences:   1    Standing Expiration Date:   03/23/2022   Cancer antigen 15-3    Standing Status:   Future    Number of Occurrences:   1    Standing Expiration Date:   03/23/2022   Cancer antigen 27.29    Standing Status:   Future    Number of Occurrences:   1    Standing Expiration Date:   03/23/2022    All questions were answered. The patient knows to call the clinic with any problems questions or concerns.  cc Kirk Ruths, MD   Thank you for this kind referral and the opportunity to participate in the care of this patient. A copy of today's note is routed to referring provider    Earlie Server, MD, PhD. Hematology [place] at [facility] Pager- [phone] 03/23/2021

## 2021-03-23 NOTE — Progress Notes (Signed)
Patient here to establish care  

## 2021-03-23 NOTE — Progress Notes (Signed)
Met patient and her 1/2 sister today during her medical oncology consult with Dr. Tasia Catchings.  Gave patient breast cancer educational literature, "My Breast Cancer Treatment Handbook" by Josephine Igo, RN.   Numbers given to patient and her sister to call navigation for assistance.  Plan to move forward with surgery, then chemotherapy per Dr. Tasia Catchings.  Patient is to return to see Dr. Tasia Catchings after surgery.

## 2021-03-24 LAB — CANCER ANTIGEN 15-3: CA 15-3: 27.8 U/mL — ABNORMAL HIGH (ref 0.0–25.0)

## 2021-03-24 LAB — CANCER ANTIGEN 27.29: CA 27.29: 50.6 U/mL — ABNORMAL HIGH (ref 0.0–38.6)

## 2021-03-27 ENCOUNTER — Telehealth: Payer: Self-pay

## 2021-03-27 NOTE — Telephone Encounter (Signed)
-----   Message from Earlie Server, MD sent at 03/26/2021 10:58 PM EDT ----- Please schedule her to see me MD visit 2 weeks after surgery. Thanks.

## 2021-03-27 NOTE — Telephone Encounter (Signed)
Pt is scheduled for breast lumpectomy on 7/28.  Please MD only 2 weeks later.

## 2021-03-27 NOTE — Telephone Encounter (Signed)
03/27/2021 Left VM informing pt of MD f/u on 04/22/21 @ 2pm, per MD. Hanley Seamen callback number in case there were any questions or concerns SRW

## 2021-04-02 ENCOUNTER — Other Ambulatory Visit: Payer: Self-pay

## 2021-04-02 ENCOUNTER — Encounter
Admission: RE | Admit: 2021-04-02 | Discharge: 2021-04-02 | Disposition: A | Discharge status: Home / Self Care | Payer: Medicare Other | Source: Ambulatory Visit | Attending: Surgery | Admitting: Surgery

## 2021-04-02 HISTORY — DX: Down syndrome, unspecified: Q90.9

## 2021-04-02 NOTE — Patient Instructions (Signed)
Your procedure is scheduled on: 04/09/21 Report to [place]. To find out your arrival time please call [phone] between 1PM - 3PM on 04/08/21.  Remember: Instructions that are not followed completely may result in serious medical risk, up to and including death, or upon the discretion of your surgeon and anesthesiologist your surgery may need to be rescheduled.     _X__ 1. Do not eat food or drink any liquids after midnight the night before your procedure.                 No gum chewing or hard candies.   __X__2.  On the morning of surgery brush your teeth with toothpaste and water, you                 may rinse your mouth with mouthwash if you wish.  Do not swallow any              toothpaste of mouthwash.     _X__ 3.  No Alcohol for 24 hours before or after surgery.   _X__ 4.  Do Not Smoke or use e-cigarettes For 24 Hours Prior to Your Surgery.                 Do not use any chewable tobacco products for at least 6 hours prior to                 surgery.  ____  5.  Bring all medications with you on the day of surgery if instructed.   __X__  6.  Notify your doctor if there is any change in your medical condition      (cold, fever, infections).     Do not wear jewelry, make-up, hairpins, clips or nail polish. Do not wear lotions, powders, or perfumes.  Do not shave 48 hours prior to surgery. Men may shave face and neck. Do not bring valuables to the hospital.    [facility] is not responsible for any belongings or valuables.  Contacts, dentures/partials or body piercings may not be worn into surgery. Bring a case for your contacts, glasses or hearing aids, a denture cup will be supplied. Leave your suitcase in the car. After surgery it may be brought to your room. For patients admitted to the hospital, discharge time is determined by your treatment team.   Patients discharged the day of surgery will not be allowed to drive  home.   Please read over the following fact sheets that you were given:     __X__ Take these medicines the morning of surgery with A SIP OF WATER:    1. ethosuximide (ZARONTIN) 250 MG capsule  2.   3.   4.  5.  6.  ____ Fleet Enema (as directed)   ____ Use CHG Soap/SAGE wipes as directed  ____ Use inhalers on the day of surgery  ____ Stop metformin/Janumet/Farxiga 2 days prior to surgery    ____ Take 1/2 of usual insulin dose the night before surgery. No insulin the morning          of surgery.   ____ Stop Blood Thinners Coumadin/Plavix/Xarelto/Pleta/Pradaxa/Eliquis/Effient/Aspirin  on   Or contact your Surgeon, Cardiologist or Medical Doctor regarding  ability to stop your blood thinners  __X__ Stop Anti-inflammatories 7 days before surgery such as Advil, Ibuprofen, Motrin,  BC or Goodies Powder, Naprosyn, Naproxen, Aleve, Aspirin    __X__ Stop all herbal supplements, fish oil  or vitamin E until after surgery.    ____ Bring C-Pap to the hospital.

## 2021-04-06 ENCOUNTER — Other Ambulatory Visit: Payer: Self-pay

## 2021-04-06 ENCOUNTER — Ambulatory Visit
Admission: RE | Admit: 2021-04-06 | Discharge: 2021-04-06 | Disposition: A | Discharge status: Home / Self Care | Payer: Medicare Other | Source: Ambulatory Visit | Attending: Surgery | Admitting: Surgery

## 2021-04-06 ENCOUNTER — Other Ambulatory Visit: Payer: Self-pay | Admitting: Surgery

## 2021-04-06 ENCOUNTER — Inpatient Hospital Stay: Admission: RE | Admit: 2021-04-06 | Payer: Medicare Other | Source: Ambulatory Visit

## 2021-04-06 ENCOUNTER — Ambulatory Visit: Admission: RE | Admit: 2021-04-06 | Payer: Medicare Other | Source: Ambulatory Visit

## 2021-04-06 DIAGNOSIS — C50412 Malignant neoplasm of upper-outer quadrant of left female breast: Secondary | ICD-10-CM

## 2021-04-07 ENCOUNTER — Ambulatory Visit: Payer: Medicare Other

## 2021-04-09 ENCOUNTER — Ambulatory Visit
Admission: RE | Admit: 2021-04-09 | Discharge: 2021-04-09 | Disposition: A | Discharge status: Home / Self Care | Payer: Medicare Other | Source: Ambulatory Visit | Attending: Surgery | Admitting: Surgery

## 2021-04-09 ENCOUNTER — Ambulatory Visit: Payer: Medicare Other | Admitting: Urgent Care

## 2021-04-09 ENCOUNTER — Encounter: Payer: Self-pay | Admitting: Surgery

## 2021-04-09 ENCOUNTER — Encounter
Admission: RE | Disposition: A | Discharge status: Home / Self Care | Payer: Self-pay | Source: Home / Self Care | Attending: Surgery

## 2021-04-09 ENCOUNTER — Inpatient Hospital Stay
Admission: RE | Admit: 2021-04-09 | Discharge: 2021-04-09 | Disposition: A | Discharge status: Home / Self Care | Payer: Medicare Other | Source: Ambulatory Visit | Attending: Surgery | Admitting: Surgery

## 2021-04-09 ENCOUNTER — Other Ambulatory Visit: Payer: Self-pay

## 2021-04-09 ENCOUNTER — Ambulatory Visit
Admission: RE | Admit: 2021-04-09 | Discharge: 2021-04-09 | Disposition: A | Discharge status: Home / Self Care | Payer: Medicare Other | Attending: Surgery | Admitting: Surgery

## 2021-04-09 DIAGNOSIS — C50012 Malignant neoplasm of nipple and areola, left female breast: Secondary | ICD-10-CM

## 2021-04-09 DIAGNOSIS — Z8349 Family history of other endocrine, nutritional and metabolic diseases: Secondary | ICD-10-CM | POA: Insufficient documentation

## 2021-04-09 DIAGNOSIS — Z170 Estrogen receptor positive status [ER+]: Secondary | ICD-10-CM | POA: Insufficient documentation

## 2021-04-09 DIAGNOSIS — R569 Unspecified convulsions: Secondary | ICD-10-CM | POA: Diagnosis not present

## 2021-04-09 DIAGNOSIS — C50412 Malignant neoplasm of upper-outer quadrant of left female breast: Secondary | ICD-10-CM

## 2021-04-09 DIAGNOSIS — C50919 Malignant neoplasm of unspecified site of unspecified female breast: Secondary | ICD-10-CM

## 2021-04-09 DIAGNOSIS — Z79899 Other long term (current) drug therapy: Secondary | ICD-10-CM | POA: Insufficient documentation

## 2021-04-09 HISTORY — PX: BREAST LUMPECTOMY,RADIO FREQ LOCALIZER,AXILLARY SENTINEL LYMPH NODE BIOPSY: SHX6900

## 2021-04-09 LAB — POCT PREGNANCY, URINE: Preg Test, Ur: NEGATIVE

## 2021-04-09 SURGERY — BREAST LUMPECTOMY,RADIO FREQ LOCALIZER,AXILLARY SENTINEL LYMPH NODE BIOPSY
Anesthesia: General | Laterality: Left

## 2021-04-09 MED ORDER — ONDANSETRON HCL 4 MG/2ML IJ SOLN
INTRAMUSCULAR | Status: AC
  Filled 2021-04-09: qty 2

## 2021-04-09 MED ORDER — FENTANYL CITRATE (PF) 100 MCG/2ML IJ SOLN
INTRAMUSCULAR | Status: AC
  Filled 2021-04-09: qty 2

## 2021-04-09 MED ORDER — LIDOCAINE HCL (CARDIAC) PF 100 MG/5ML IV SOSY
PREFILLED_SYRINGE | INTRAVENOUS | Status: DC | PRN
  Administered 2021-04-09: 50 mg via INTRAVENOUS

## 2021-04-09 MED ORDER — IBUPROFEN 800 MG PO TABS: 800.0000 mg | ORAL_TABLET | Freq: Three times a day (TID) | ORAL | 0 refills | Status: DC | PRN

## 2021-04-09 MED ORDER — BUPIVACAINE-EPINEPHRINE (PF) 0.5% -1:200000 IJ SOLN
INTRAMUSCULAR | Status: AC
  Filled 2021-04-09: qty 30

## 2021-04-09 MED ORDER — FENTANYL CITRATE (PF) 100 MCG/2ML IJ SOLN: 25.0000 ug | INTRAMUSCULAR | Status: DC | PRN

## 2021-04-09 MED ORDER — DOCUSATE SODIUM 100 MG PO CAPS: 100.0000 mg | ORAL_CAPSULE | Freq: Two times a day (BID) | ORAL | 0 refills | Status: AC | PRN

## 2021-04-09 MED ORDER — MIDAZOLAM HCL 2 MG/2ML IJ SOLN
INTRAMUSCULAR | Status: AC
  Filled 2021-04-09: qty 2

## 2021-04-09 MED ORDER — LIDOCAINE HCL (PF) 1 % IJ SOLN
INTRAMUSCULAR | Status: AC
  Filled 2021-04-09: qty 30

## 2021-04-09 MED ORDER — CHLORHEXIDINE GLUCONATE 0.12 % MT SOLN
OROMUCOSAL | Status: AC
  Administered 2021-04-09: 15 mL via OROMUCOSAL
  Filled 2021-04-09: qty 15

## 2021-04-09 MED ORDER — DEXMEDETOMIDINE HCL 200 MCG/2ML IV SOLN
INTRAVENOUS | Status: DC | PRN
  Administered 2021-04-09: 8 ug via INTRAVENOUS

## 2021-04-09 MED ORDER — CHLORHEXIDINE GLUCONATE CLOTH 2 % EX PADS
6.0000 | MEDICATED_PAD | Freq: Once | CUTANEOUS | Status: AC
  Administered 2021-04-09: 6 via TOPICAL

## 2021-04-09 MED ORDER — LIDOCAINE HCL (PF) 2 % IJ SOLN
INTRAMUSCULAR | Status: AC
  Filled 2021-04-09: qty 5

## 2021-04-09 MED ORDER — PROPOFOL 10 MG/ML IV BOLUS
INTRAVENOUS | Status: AC
  Filled 2021-04-09: qty 20

## 2021-04-09 MED ORDER — BUPIVACAINE-EPINEPHRINE 0.5% -1:200000 IJ SOLN
INTRAMUSCULAR | Status: DC | PRN
  Administered 2021-04-09: 5.5 mL

## 2021-04-09 MED ORDER — STERILE WATER FOR IRRIGATION IR SOLN
Status: DC | PRN
  Administered 2021-04-09: 120 mL

## 2021-04-09 MED ORDER — TECHNETIUM TC 99M TILMANOCEPT KIT
1.0000 | PACK | Freq: Once | INTRAVENOUS | Status: AC | PRN
  Administered 2021-04-09: 1.06 via INTRADERMAL

## 2021-04-09 MED ORDER — CEFAZOLIN SODIUM-DEXTROSE 2-4 GM/100ML-% IV SOLN
2.0000 g | INTRAVENOUS | Status: AC
  Administered 2021-04-09: 2 g via INTRAVENOUS

## 2021-04-09 MED ORDER — FAMOTIDINE 20 MG PO TABS
ORAL_TABLET | ORAL | Status: AC
  Administered 2021-04-09: 20 mg via ORAL
  Filled 2021-04-09: qty 1

## 2021-04-09 MED ORDER — FENTANYL CITRATE (PF) 100 MCG/2ML IJ SOLN
INTRAMUSCULAR | Status: DC | PRN
  Administered 2021-04-09 (×4): 25 ug via INTRAVENOUS

## 2021-04-09 MED ORDER — MIDAZOLAM HCL 2 MG/2ML IJ SOLN
INTRAMUSCULAR | Status: DC | PRN
  Administered 2021-04-09 (×2): 1 mg via INTRAVENOUS

## 2021-04-09 MED ORDER — ACETAMINOPHEN 10 MG/ML IV SOLN
INTRAVENOUS | Status: DC | PRN
  Administered 2021-04-09: 1000 mg via INTRAVENOUS

## 2021-04-09 MED ORDER — DEXAMETHASONE SODIUM PHOSPHATE 10 MG/ML IJ SOLN
INTRAMUSCULAR | Status: DC | PRN
  Administered 2021-04-09: 10 mg via INTRAVENOUS

## 2021-04-09 MED ORDER — DEXMEDETOMIDINE (PRECEDEX) IN NS 20 MCG/5ML (4 MCG/ML) IV SYRINGE
PREFILLED_SYRINGE | INTRAVENOUS | Status: AC
  Filled 2021-04-09: qty 5

## 2021-04-09 MED ORDER — HYDROCODONE-ACETAMINOPHEN 7.5-325 MG PO TABS: 1.0000 | ORAL_TABLET | Freq: Once | ORAL | Status: DC | PRN

## 2021-04-09 MED ORDER — HYDROCODONE-ACETAMINOPHEN 5-325 MG PO TABS: 1.0000 | ORAL_TABLET | Freq: Four times a day (QID) | ORAL | 0 refills | Status: DC | PRN

## 2021-04-09 MED ORDER — PROPOFOL 10 MG/ML IV BOLUS
INTRAVENOUS | Status: DC | PRN
  Administered 2021-04-09: 20 mg via INTRAVENOUS
  Administered 2021-04-09: 100 mg via INTRAVENOUS
  Administered 2021-04-09: 30 mg via INTRAVENOUS
  Administered 2021-04-09: 50 mg via INTRAVENOUS

## 2021-04-09 MED ORDER — ONDANSETRON HCL 4 MG/2ML IJ SOLN
INTRAMUSCULAR | Status: DC | PRN
  Administered 2021-04-09: 4 mg via INTRAVENOUS

## 2021-04-09 MED ORDER — PHENYLEPHRINE HCL (PRESSORS) 10 MG/ML IV SOLN
INTRAVENOUS | Status: DC | PRN
  Administered 2021-04-09 (×6): 100 ug via INTRAVENOUS

## 2021-04-09 MED ORDER — FAMOTIDINE 20 MG PO TABS: 20.0000 mg | ORAL_TABLET | Freq: Once | ORAL | Status: AC

## 2021-04-09 MED ORDER — LACTATED RINGERS IV SOLN: INTRAVENOUS | Status: DC

## 2021-04-09 MED ORDER — EPHEDRINE SULFATE 50 MG/ML IJ SOLN
INTRAMUSCULAR | Status: DC | PRN
  Administered 2021-04-09 (×2): 5 mg via INTRAVENOUS
  Administered 2021-04-09: 15 mg via INTRAVENOUS

## 2021-04-09 MED ORDER — LIDOCAINE HCL 1 % IJ SOLN
INTRAMUSCULAR | Status: DC | PRN
  Administered 2021-04-09: 5.5 mL

## 2021-04-09 MED ORDER — ORAL CARE MOUTH RINSE: 15.0000 mL | Freq: Once | OROMUCOSAL | Status: AC

## 2021-04-09 MED ORDER — CHLORHEXIDINE GLUCONATE 0.12 % MT SOLN: 15.0000 mL | Freq: Once | OROMUCOSAL | Status: AC

## 2021-04-09 MED ORDER — ACETAMINOPHEN 10 MG/ML IV SOLN
INTRAVENOUS | Status: AC
  Filled 2021-04-09: qty 100

## 2021-04-09 MED ORDER — EPHEDRINE 5 MG/ML INJ
INTRAVENOUS | Status: AC
  Filled 2021-04-09: qty 5

## 2021-04-09 MED ORDER — CEFAZOLIN SODIUM-DEXTROSE 2-4 GM/100ML-% IV SOLN
INTRAVENOUS | Status: AC
  Filled 2021-04-09: qty 100

## 2021-04-09 SURGICAL SUPPLY — 39 items
ADH SKN CLS APL DERMABOND .7 (GAUZE/BANDAGES/DRESSINGS) ×1
APL PRP STRL LF DISP 70% ISPRP (MISCELLANEOUS) ×1
BLADE SURG 15 STRL LF DISP TIS (BLADE) ×1 IMPLANT
BLADE SURG 15 STRL SS (BLADE) ×2
CANISTER SUCT 1200ML W/VALVE (MISCELLANEOUS) ×2 IMPLANT
CHLORAPREP W/TINT 26 (MISCELLANEOUS) ×2 IMPLANT
DERMABOND ADVANCED (GAUZE/BANDAGES/DRESSINGS) ×1
DERMABOND ADVANCED .7 DNX12 (GAUZE/BANDAGES/DRESSINGS) ×1 IMPLANT
DEVICE DSSCT PLSMBLD 3.0S LGHT (MISCELLANEOUS) ×1 IMPLANT
DEVICE DUBIN SPECIMEN MAMMOGRA (MISCELLANEOUS) ×2 IMPLANT
DRAPE LAPAROTOMY 77X122 PED (DRAPES) ×2 IMPLANT
ELECT CAUTERY BLADE TIP 2.5 (TIP) ×2
ELECT REM PT RETURN 9FT ADLT (ELECTROSURGICAL) ×2
ELECTRODE CAUTERY BLDE TIP 2.5 (TIP) ×1 IMPLANT
ELECTRODE REM PT RTRN 9FT ADLT (ELECTROSURGICAL) ×1 IMPLANT
GAUZE 4X4 16PLY ~~LOC~~+RFID DBL (SPONGE) ×2 IMPLANT
GLOVE SURG SYN 6.5 ES PF (GLOVE) ×4 IMPLANT
GLOVE SURG SYN 6.5 PF PI (GLOVE) ×2 IMPLANT
GLOVE SURG UNDER POLY LF SZ7 (GLOVE) ×2 IMPLANT
GOWN STRL REUS W/ TWL LRG LVL3 (GOWN DISPOSABLE) ×3 IMPLANT
GOWN STRL REUS W/TWL LRG LVL3 (GOWN DISPOSABLE) ×6
KIT MARKER MARGIN INK (KITS) ×1 IMPLANT
KIT TURNOVER KIT A (KITS) ×2 IMPLANT
LABEL OR SOLS (LABEL) ×2 IMPLANT
MANIFOLD NEPTUNE II (INSTRUMENTS) ×2 IMPLANT
NEEDLE HYPO 22GX1.5 SAFETY (NEEDLE) ×3 IMPLANT
PACK BASIN MINOR ARMC (MISCELLANEOUS) ×2 IMPLANT
PLASMABLADE 3.0S W/LIGHT (MISCELLANEOUS) ×2
SET LOCALIZER 20 PROBE US (MISCELLANEOUS) ×2 IMPLANT
SUT MNCRL 4-0 (SUTURE) ×4
SUT MNCRL 4-0 27XMFL (SUTURE) ×2
SUT VIC AB 3-0 SH 27 (SUTURE) ×4
SUT VIC AB 3-0 SH 27X BRD (SUTURE) ×2 IMPLANT
SUTURE MNCRL 4-0 27XMF (SUTURE) ×2 IMPLANT
SYR 20ML LL LF (SYRINGE) ×2 IMPLANT
SYR BULB IRRIG 60ML STRL (SYRINGE) ×2 IMPLANT
TRAP NEPTUNE SPECIMEN COLLECT (MISCELLANEOUS) ×2 IMPLANT
TUBING CONNECTING 10 (TUBING) ×2 IMPLANT
WATER STERILE IRR 1000ML POUR (IV SOLUTION) ×2 IMPLANT

## 2021-04-09 NOTE — Transfer of Care (Signed)
Immediate Anesthesia Transfer of Care Note  Patient: Anna Banks  Procedure(s) Performed: BREAST [place] SENTINEL LYMPH NODE BIOPSY (Left)  Patient Location: PACU  Anesthesia Type:General  Level of Consciousness: drowsy  Airway & Oxygen Therapy: Patient Spontanous Breathing and Patient connected to face mask oxygen  Post-op Assessment: Report given to RN and Post -op Vital signs reviewed and stable  Post vital signs: Reviewed and stable  Last Vitals:  Vitals Value Taken Time  BP    Temp    Pulse 97 04/09/21 1257  Resp 15 04/09/21 1257  SpO2 100 % 04/09/21 1257  Vitals shown include unvalidated device data.  Last Pain:  Vitals:   04/09/21 0925  TempSrc: Temporal  PainSc: 0-No pain         Complications: No notable events documented.

## 2021-04-09 NOTE — Interval H&P Note (Signed)
History and Physical Interval Note:  04/09/2021 10:22 AM  Anna Banks  has presented today for surgery, with the diagnosis of Malignant neoplasm of upper-outer quadrant of left breast in female, estrogen receptor positive CMS-HCC.  The various methods of treatment have been discussed with the patient and family. After consideration of risks, benefits and other options for treatment, the patient has consented to  Procedure(s): [place] (Left) as a surgical intervention.  The patient's history has been reviewed, patient examined, no change in status, stable for surgery.  I have reviewed the patient's chart and labs.  Questions were answered to the patient's satisfaction.     Flower Franko Lysle Pearl

## 2021-04-09 NOTE — Anesthesia Postprocedure Evaluation (Signed)
Anesthesia Post Note  Patient: Anna Banks  Procedure(s) Performed: BREAST LUMPECTOMY,RADIO FREQ LOCALIZER,AXILLARY SENTINEL LYMPH NODE BIOPSY (Left)  Patient location during evaluation: PACU Anesthesia Type: General Level of consciousness: awake and alert Pain management: pain level controlled Vital Signs Assessment: post-procedure vital signs reviewed and stable Respiratory status: spontaneous breathing, nonlabored ventilation, respiratory function stable and patient connected to nasal cannula oxygen Cardiovascular status: blood pressure returned to baseline and stable Postop Assessment: no apparent nausea or vomiting Anesthetic complications: no   No notable events documented.   Last Vitals:  Vitals:   04/09/21 1345 04/09/21 1401  BP: 113/68 116/73  Pulse: 98 (!) 104  Resp: 15 16  Temp: 36.4 C 36.5 C  SpO2: 98% 97%    Last Pain:  Vitals:   04/09/21 1401  TempSrc: Temporal  PainSc: 0-No pain                 Margaree Mackintosh

## 2021-04-09 NOTE — Discharge Instructions (Addendum)
Removal, Care After This sheet gives you information about how to care for yourself after your procedure. Your health care provider may also give you more specific instructions. If you have problems or questions, contact your health care provider. What can I expect after the procedure? After the procedure, it is common to have: Soreness. Bruising. Itching. Follow these instructions at home: site care Follow instructions from your health care provider about how to take care of your site. Make sure you: Wash your hands with soap and water before and after you change your bandage (dressing). If soap and water are not available, use hand sanitizer. Leave stitches (sutures), skin glue, or adhesive strips in place. These skin closures may need to stay in place for 2 weeks or longer. If adhesive strip edges start to loosen and curl up, you may trim the loose edges. Do not remove adhesive strips completely unless your health care provider tells you to do that. If the area bleeds or bruises, apply gentle pressure for 10 minutes. OK TO SHOWER IN 24HRS  Check your site every day for signs of infection. Check for: Redness, swelling, or pain. Fluid or blood. Warmth. Pus or a bad smell.  General instructions Rest and then return to your normal activities as told by your health care provider.  tylenol and advil as needed for discomfort.  Please alternate between the two every four hours as needed for pain.    Use narcotics, if prescribed, only when tylenol and motrin is not enough to control pain.  325-650mg every 8hrs to max of 3000mg/24hrs (including the 325mg in every norco dose) for the tylenol.    Advil up to 800mg per dose every 8hrs as needed for pain.   Keep all follow-up visits as told by your health care provider. This is important. Contact a health care provider if: You have redness, swelling, or pain around your site. You have fluid or blood coming from your site. Your site feels warm to  the touch. You have pus or a bad smell coming from your site. You have a fever. Your sutures, skin glue, or adhesive strips loosen or come off sooner than expected. Get help right away if: You have bleeding that does not stop with pressure or a dressing. Summary After the procedure, it is common to have some soreness, bruising, and itching at the site. Follow instructions from your health care provider about how to take care of your site. Check your site every day for signs of infection. Contact a health care provider if you have redness, swelling, or pain around your site, or your site feels warm to the touch. Keep all follow-up visits as told by your health care provider. This is important. This information is not intended to replace advice given to you by your health care provider. Make sure you discuss any questions you have with your health care provider. Document Released: 09/26/2015 Document Revised: 02/27/2018 Document Reviewed: 02/27/2018 Elsevier Interactive Patient Education  2019 Elsevier Inc.  AMBULATORY SURGERY  DISCHARGE INSTRUCTIONS   The drugs that you were given will stay in your system until tomorrow so for the next 24 hours you should not:  Drive an automobile Make any legal decisions Drink any alcoholic beverage   You may resume regular meals tomorrow.  Today it is better to start with liquids and gradually work up to solid foods.  You may eat anything you prefer, but it is better to start with liquids, then soup and crackers, and gradually   work up to [employer].   Please notify your doctor immediately if you have any unusual bleeding, trouble breathing, redness and pain at the surgery site, drainage, fever, or pain not relieved by medication.    Additional Instructions:     Please contact your physician with any problems or Same Day Surgery at [phone], Monday through Friday 6 am to 4 pm, or [place] [facility] number at [phone].     PROTECT LEFT ARM WHERE SENTINAL NODE WAS REMOVED.  NO BLOOD PRESSURE OR LAB DRAWS TO BE DONE ON THAT ARM.

## 2021-04-09 NOTE — Op Note (Signed)
Preoperative diagnosis:  left breast carcinoma.  Postoperative diagnosis: same.   Procedure: RF tag-localized left  breast partial mastectomy.                      left  Axillary Sentinel Lymph node biopsy  Anesthesia: GETA  Surgeon: Dr. Benjamine Sprague  Wound Classification: Clean  Indications: Patient is a 45 y.o. female with a nonpalpable left  breast mass noted on mammography with core biopsy demonstrating breat CA.  requires RF localizer placement, partial mastectomy for treatment with sentinel lymph node biopsy.   Specimen: left  Breast mass, Sentinel Lymph nodes x 3  Complications: None  Estimated Blood Loss: 59m  Findings: 1. Specimen mammography shows marker and RF localizer on specimen 2. Pathology call refers gross examination of margins was negative 3. No other palpable mass or lymph node identified.   Operation performed with curative intent:Yes  Tracer(s) used to identify sentinel nodes in the upfront surgery (non-neoadjuvant) setting (select all that apply):Radioactive Tracer  Tracer(s) used to identify sentinel nodes in the neoadjuvant setting (select all that apply):N/A  All nodes (colored or non-colored) present at the end of a dye-filled lymphatic channel were removed:N/A  All significantly radioactive nodes were removed:Yes  All palpable suspicious nodes were removed:Yes  Biopsy-proven positive nodes marked with clips prior to chemotherapy were identified and removed:N/A    Description of procedure: RF localization was performed by radiology prior to procedure. In the nuclear medicine suite, the subareolar region was injected with Tc-99 sulfur colloid the morning of procedure. Localization studies were reviewed. The patient was taken to the operating room and placed supine on the operating table, and after general anesthesia the left breast and axilla were prepped and draped in the usual sterile fashion. A time-out was completed verifying correct patient,  procedure, site, positioning, and implant(s) and/or special equipment prior to beginning this procedure.  By identifying the RF localizer, the probable trajectory and location of the mass was visualized. A skin incision was planned in such a way as to minimize the amount of dissection to reach the mass.  The skin incision was made after infusion of local. Flaps were raised and  Sharp and blunt dissection was then taken down to the mass, taking care to include the entire RF localizer and a margin of grossly normal tissue. The specimen was removed. The specimen was oriented with paint and sent to radiology with the localization studies. Confirmation was received that the entire target lesion had been resected. A hand-held gamma probe was used to identify the location of the hottest spot in the axilla. An incision was made around the caudal axillary hairline. Sharp and blunt Dissection was carried down to subdermal facias. The probe was placed within wound and again, the point of maximal count was found. Dissection continue until nodule was identified. The probe was placed in contact with the node and 3100 counts were recorded. The node was excised in its entirety. Ex vivo, the node measured 3150 counts when placed on the probe. Two additional hot spots mearuing in the 1100counts were detected and the nodes were excised in similar fashion. No additional hot spots were identified. No clinically abnormal nodes were palpated. Both wounds irrigated, hemostasis was achieved and the wound closed in layers with  interrupted sutures of 3-0 Vicryl in deep dermal layer and a running subcuticular suture of Monocryl 4-0, then dressed with dermabond. The patient tolerated the procedure well and was taken to the postanesthesia care unit  in stable condition. Sponge and instrument count correct at end of procedure.

## 2021-04-09 NOTE — Anesthesia Preprocedure Evaluation (Signed)
Anesthesia Evaluation  Patient identified by MRN, date of birth, ID band Patient awake    Reviewed: Allergy & Precautions, NPO status , Patient's Chart, lab work & pertinent test results  Airway Mallampati: II  TM Distance: >3 FB Neck ROM: full    Dental no notable dental hx.    Pulmonary neg pulmonary ROS,    Pulmonary exam normal        Cardiovascular negative cardio ROS Normal cardiovascular exam     Neuro/Psych Seizures -, Well Controlled,  negative psych ROS   GI/Hepatic negative GI ROS, Neg liver ROS,   Endo/Other  negative endocrine ROS  Renal/GU      Musculoskeletal   Abdominal   Peds  Hematology negative hematology ROS (+)   Anesthesia Other Findings Past Medical History: No date: Down syndrome No date: Seizure [facility])  Past Surgical History: 03/10/2021: BREAST BIOPSY; Left     Comment:  [place] BX,ribbon clip, IMC     Reproductive/Obstetrics negative OB ROS                             Anesthesia Physical Anesthesia Plan  ASA: 2  Anesthesia Plan: General ETT   Post-op Pain Management:    Induction: Intravenous  PONV Risk Score and Plan: Ondansetron, Dexamethasone, Midazolam and Treatment may vary due to age or medical condition  Airway Management Planned: Oral ETT  Additional Equipment:   Intra-op Plan:   Post-operative Plan: Extubation in OR  Informed Consent: I have reviewed the patients History and Physical, chart, labs and discussed the procedure including the risks, benefits and alternatives for the proposed anesthesia with the patient or authorized representative who has indicated his/her understanding and acceptance.     Dental Advisory Given  Plan Discussed with: Anesthesiologist, CRNA and Surgeon  Anesthesia Plan Comments: (Patient consented for risks of anesthesia including but not limited to:  - adverse reactions to medications - damage to eyes,  teeth, lips or other oral mucosa - nerve damage due to positioning  - sore throat or hoarseness - Damage to heart, brain, nerves, lungs, other parts of body or loss of life  Patient voiced understanding.)        Anesthesia Quick Evaluation

## 2021-04-09 NOTE — Anesthesia Procedure Notes (Signed)
Procedure Name: LMA Insertion Date/Time: 04/09/2021 10:57 AM Performed by: Tollie Eth, CRNA Pre-anesthesia Checklist: Patient identified, Patient being monitored, Timeout performed, Emergency Drugs available and Suction available Patient Re-evaluated:Patient Re-evaluated prior to induction Oxygen Delivery Method: Circle system utilized Preoxygenation: Pre-oxygenation with 100% oxygen Induction Type: IV induction Ventilation: Mask ventilation without difficulty LMA: LMA inserted LMA Size: 3.5 Tube type: Oral Number of attempts: 1 Placement Confirmation: positive ETCO2 and breath sounds checked- equal and bilateral Tube secured with: Tape Dental Injury: Teeth and Oropharynx as per pre-operative assessment

## 2021-04-10 ENCOUNTER — Encounter: Payer: Self-pay | Admitting: Surgery

## 2021-04-14 ENCOUNTER — Other Ambulatory Visit: Payer: Self-pay | Admitting: Oncology

## 2021-04-22 ENCOUNTER — Inpatient Hospital Stay: Payer: Medicare Other | Attending: Oncology | Admitting: Oncology

## 2021-04-22 ENCOUNTER — Encounter: Payer: Self-pay | Admitting: Oncology

## 2021-04-22 ENCOUNTER — Telehealth: Payer: Self-pay | Admitting: Oncology

## 2021-04-22 VITALS — BP 110/73 | HR 91 | Temp 98.9°F | Resp 18 | Wt 119.6 lb

## 2021-04-22 DIAGNOSIS — C50412 Malignant neoplasm of upper-outer quadrant of left female breast: Secondary | ICD-10-CM | POA: Insufficient documentation

## 2021-04-22 DIAGNOSIS — Z5111 Encounter for antineoplastic chemotherapy: Secondary | ICD-10-CM | POA: Insufficient documentation

## 2021-04-22 DIAGNOSIS — Z170 Estrogen receptor positive status [ER+]: Secondary | ICD-10-CM | POA: Insufficient documentation

## 2021-04-22 DIAGNOSIS — C50919 Malignant neoplasm of unspecified site of unspecified female breast: Secondary | ICD-10-CM

## 2021-04-22 DIAGNOSIS — Z7189 Other specified counseling: Secondary | ICD-10-CM | POA: Diagnosis not present

## 2021-04-22 DIAGNOSIS — R4189 Other symptoms and signs involving cognitive functions and awareness: Secondary | ICD-10-CM | POA: Diagnosis not present

## 2021-04-22 DIAGNOSIS — Z5112 Encounter for antineoplastic immunotherapy: Secondary | ICD-10-CM | POA: Diagnosis present

## 2021-04-22 NOTE — Telephone Encounter (Signed)
Attemoted to reach patients family to reschedule appt per Dr. Tasia Catchings request. Pt was unable to complete the visit due to language barriers. Dr. Tasia Catchings is requesting the family attend the visit for better communication. They are use the VRN for interpreter services when facilitating the visit.

## 2021-04-22 NOTE — Progress Notes (Signed)
Hematology/Oncology follow up  note [facility] Telephone:([phone] Fax:([phone]) [phone]   Patient Care Team: Kirk Ruths, MD as PCP - General (Internal Medicine)  REFERRING PROVIDER: Kirk Ruths, MD  CHIEF COMPLAINTS/REASON FOR VISIT:  Evaluation of breast cancer  HISTORY OF PRESENTING ILLNESS:   Anna Banks is a  45 y.o.  female with PMH listed below was seen in consultation at the request of  Kirk Ruths, MD  for evaluation of breast cancer.   Patient was accompanied by her half sister today. Per sister, patient has history of seizure since childhood and has intellectual/cognitive impairment. She is illiterate. She follows instructions from her family members.  She lives with her mother and her half sister who takes of her and helps her to make medical decision.   They waived interpretor. Both patient and her half sister speak [place].  Menarche 3 years of age. G0P0. No child, no previous pregnancy. Per her sister, patient is not sexually active No prior use of birth control pills or hormone replacement therapy. Denies previous history of biopsy.  02/16/2021, screening mammogram showed calcification in the right breast needs further evaluation.  Left breast possible mass with distortion requires further evaluation. 02/26/2021, left breast showed a 10 mm indeterminate mass, 2:00, 8 cm from nipple.  No mammographic evidence of malignancy in the right breast.  No left axillary lymphadenopathy. 03/10/2021, patient underwent ultrasound-guided biopsy of left breast mass.  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  ER low positive [1-10%], PR positive [1-10%], HER2 positive by IHC 3+  Patient has met surgeon Dr. Lysle Pearl today.  She presents to establish care with oncology.  INTERVAL HISTORY Anna Banks is a 45 y.o. female who has above history reviewed by me  today presents for follow up visit for  Breast cancer management. Patient had surgery done.  Reports no new complaints.  She is here alone by herself.   Review of Systems  Unable to perform ROS: Other (ntellectual/Cognitive impairment)  Constitutional:  Negative for appetite change, chills, fatigue and fever.  HENT:   Negative for hearing loss and voice change.   Eyes:  Negative for eye problems.  Respiratory:  Negative for chest tightness and cough.   Cardiovascular:  Negative for chest pain.  Gastrointestinal:  Negative for abdominal distention, abdominal pain and blood in stool.  Endocrine: Negative for hot flashes.  Genitourinary:  Negative for difficulty urinating and frequency.   Musculoskeletal:  Negative for arthralgias.  Skin:  Negative for itching and rash.  Neurological:  Negative for extremity weakness.  Hematological:  Negative for adenopathy.  Psychiatric/Behavioral:  Negative for confusion.    MEDICAL HISTORY:  Past Medical History:  Diagnosis Date   Down syndrome    Seizure [facility])     SURGICAL HISTORY: Past Surgical History:  Procedure Laterality Date   BREAST BIOPSY Left 03/10/2021   [place] BX,ribbon clip, IMC   BREAST LUMPECTOMY,RADIO FREQ LOCALIZER,AXILLARY SENTINEL LYMPH NODE BIOPSY Left 04/09/2021   Procedure: BREAST LUMPECTOMY,RADIO FREQ LOCALIZER,AXILLARY SENTINEL LYMPH NODE BIOPSY;  Surgeon: Benjamine Sprague, DO;  Location: [facility] ORS;  Service: General;  Laterality: Left;    SOCIAL HISTORY: Social History   Socioeconomic History   Marital status: Single    Spouse name: Not on file   Number of children: Not on file   Years of education: Not on file   Highest education level: Not on file  Occupational History  Not on file  Tobacco Use   Smoking status: Never   Smokeless tobacco: Never  Vaping Use   Vaping Use: Never used  Substance and Sexual Activity   Alcohol use: Never   Drug use: Not Currently   Sexual activity: Not on file  Other Topics  Concern   Not on file  Social History Narrative   Not on file   Social Determinants of Health   Financial Resource Strain: Not on file  Food Insecurity: Not on file  Transportation Needs: Not on file  Physical Activity: Not on file  Stress: Not on file  Social Connections: Not on file  Intimate Partner Violence: Not on file    FAMILY HISTORY: History reviewed. No pertinent family history.  ALLERGIES:  has No Known Allergies.  MEDICATIONS:  Current Outpatient Medications  Medication Sig Dispense Refill   acetaminophen (TYLENOL) 325 MG tablet Take 325-650 mg by mouth every 6 (six) hours as needed for moderate pain.     ethosuximide (ZARONTIN) 250 MG capsule Take 250 mg by mouth 2 (two) times daily.     HYDROcodone-acetaminophen (NORCO) 5-325 MG tablet Take 1 tablet by mouth every 6 (six) hours as needed for up to 6 doses for moderate pain. 6 tablet 0   ibuprofen (ADVIL) 800 MG tablet Take 1 tablet (800 mg total) by mouth every 8 (eight) hours as needed for mild pain or moderate pain. 30 tablet 0   VITAMIN D PO Take 1 capsule by mouth daily.     No current facility-administered medications for this visit.     PHYSICAL EXAMINATION: ECOG PERFORMANCE STATUS: 0 - Asymptomatic Vitals:   04/22/21 1350  BP: 110/73  Pulse: 91  Resp: 18  Temp: 98.9 F (37.2 C)   Filed Weights   04/22/21 1350  Weight: 119 lb 9.6 oz (54.3 kg)    Physical Exam Constitutional:      General: She is not in acute distress. HENT:     Head: Normocephalic and atraumatic.  Eyes:     General: No scleral icterus. Cardiovascular:     Rate and Rhythm: Normal rate and regular rhythm.     Heart sounds: Normal heart sounds.  Pulmonary:     Effort: Pulmonary effort is normal. No respiratory distress.     Breath sounds: No wheezing.  Abdominal:     General: Bowel sounds are normal. There is no distension.     Palpations: Abdomen is soft.  Musculoskeletal:        General: No deformity. Normal range  of motion.     Cervical back: Normal range of motion and neck supple.  Skin:    General: Skin is warm and dry.     Findings: No erythema or rash.  Neurological:     Mental Status: She is alert. Mental status is at baseline.     Cranial Nerves: No cranial nerve deficit.     Coordination: Coordination normal.  Psychiatric:        Mood and Affect: Mood normal.   .   LABORATORY DATA:  I have reviewed the data as listed Lab Results  Component Value Date   WBC 4.9 03/23/2021   HGB 14.4 03/23/2021   HCT 43.2 03/23/2021   MCV 94.9 03/23/2021   PLT 320 03/23/2021   Recent Labs    03/23/21 1006  NA 137  K 4.0  CL 105  CO2 25  GLUCOSE 99  BUN 10  CREATININE 0.58  CALCIUM 9.4  GFRNONAA >60  PROT 8.2*  ALBUMIN 4.4  AST 25  ALT 18  ALKPHOS 48  BILITOT 0.5    Iron/TIBC/Ferritin/ %Sat No results found for: IRON, TIBC, FERRITIN, IRONPCTSAT    RADIOGRAPHIC STUDIES: I have personally reviewed the radiological images as listed and agreed with the findings in the report. NM Sentinel Node Inj-No Rpt (Breast)  Result Date: 04/09/2021 Sulfur Colloid was injected by the Nuclear Medicine Technologist for sentinel lymph node localization.   MM Breast Surgical Specimen  Result Date: 04/09/2021 CLINICAL DATA:  Patient status post left lumpectomy. EXAM: SPECIMEN RADIOGRAPH OF THE LEFT BREAST COMPARISON:  Previous exam(s). FINDINGS: Status post excision of the left breast. The tag and biopsy marker clip are present and completely intact. IMPRESSION: Specimen radiograph of the left breast. Electronically Signed   By: Lovey Newcomer M.D.   On: 04/09/2021 13:14  MM DIAG BREAST TOMO UNI LEFT  Result Date: 04/06/2021 CLINICAL DATA:  Status post ultrasound-guided placement of a radiofrequency tag adjacent to a biopsy-proven invasive carcinoma in the LEFT breast at the 2 o'clock axis. EXAM: DIAGNOSTIC LEFT MAMMOGRAM POST ultrasound-guided radiofrequency tag localization. COMPARISON:  Previous  exam(s). FINDINGS: Mammographic images were obtained following ultrasound guided radiofrequency tag localization of the LEFT breast mass at the 2 o'clock axis, 8 cm from the nipple. The radiofrequency tag is positioned just lateral to the ribbon shaped clip which demarcates the position of the LEFT breast mass. IMPRESSION: Appropriate positioning of the radiofrequency tag slightly superior-lateral to the LEFT breast mass (which contains a ribbon shaped clip) at the 2 o'clock axis. Final Assessment: Post Procedure Mammograms for Marker Placement Electronically Signed   By: Franki Cabot M.D.   On: 04/06/2021 16:48  [place] LT RADIO FREQUENCY TAG LOC US GUIDE  Result Date: 04/06/2021 CLINICAL DATA:  Patient with a biopsy-proven invasive mammary carcinoma in the LEFT breast at the 2 o'clock axis which is scheduled for lumpectomy requiring preoperative radiofrequency tag localization. EXAM: Radiofrequency tag LOCALIZATION OF THE LEFT BREAST WITH ULTRASOUND GUIDANCE COMPARISON:  Previous exams. FINDINGS: Patient presents for radiofrequency tag localization prior to lumpectomy. I met with the patient and we discussed the procedure of localization including benefits and alternatives. We discussed the high likelihood of a successful procedure. We discussed the risks of the procedure, including infection, bleeding, tissue injury, and further surgery. Informed, written consent was given. The usual time-out protocol was performed immediately prior to the procedure. Using ultrasound guidance, sterile technique, 1% lidocaine and a 7 cm radiofrequency tag needle, the mass in the LEFT breast at the 2 o'clock axis, 8 cm from the nipple, was localized using a lateral approach. Reflector function was confirmed with an auditory signal. The images were marked for [employer]. IMPRESSION: Radar reflector localization of the left breast. No apparent complications. Electronically Signed   By: Franki Cabot M.D.   On: 04/06/2021 16:27      ASSESSMENT & PLAN:  1. Invasive carcinoma of breast ([place])   2. Goals of care, counseling/discussion   Cancer Staging Invasive carcinoma of breast ([place]) Staging form: Breast, AJCC 8th Edition - Pathologic stage from 04/22/2021: Stage IA (pT1c, pN0, cM0, G2, ER+, PR+, HER2+) - Signed by Earlie Server, MD on 04/22/2021  #Left invasive carcinoma of breast, ER/PR weakly positive, HER2 positive. Cancer Staging Invasive carcinoma of breast ([place]) Staging form: Breast, AJCC 8th Edition - Clinical stage from 03/23/2021: Stage IA (cT1b, cN0, cM0, G2, ER+, PR+, HER2+) - Signed by Earlie Server, MD on 03/23/2021  HER2 positive breast cancer,  04/09/2021,  status postlumpectomy and sentinel lymph node biopsy. Invasive mammary carcinoma, no special type, 4 lymph nodes all negative for metastatic carcinoma.  DCIS present.  LVI not identified.Grade 2, all margins negative for invasive carcinoma. pT1c pN0, ER 1-10%, PR +, HER 2   I recommend adjuvant chemotherapy with Taxol weekly+ Transtuzumab x 12 weeks, followed by Transtuzumab Q3 qweeks x 1 year. Patient has poor insight and is not competent. I called patient's sister and not able to reach.  Will reaschedule another appointment to discuss the plan.   Need chemotherapy class, ECHO.    All questions were answered. The patient knows to call the clinic with any problems questions or concerns.  cc Kirk Ruths, MD    Earlie Server, MD, PhD. Hematology [place] [facility] Pager- [phone] 04/22/2021

## 2021-04-22 NOTE — Progress Notes (Signed)
START ON PATHWAY REGIMEN - Breast     Cycle 1: A cycle is 7 days:     Trastuzumab-xxxx      Paclitaxel    Cycles 2 through 12: A cycle is every 7 days:     Trastuzumab-xxxx      Paclitaxel    Cycles 13 through 25: A cycle is every 21 days:     Trastuzumab-xxxx   **Always confirm dose/schedule in your pharmacy ordering system**  Patient Characteristics: Postoperative without Neoadjuvant Therapy (Pathologic Staging), Invasive Disease, Adjuvant Therapy, HER2 Positive, ER Positive, Node Negative, pT1c, pN0/N1mi Therapeutic Status: Postoperative without Neoadjuvant Therapy (Pathologic Staging) AJCC Grade: G2 AJCC N Category: pN0 AJCC M Category: cM0 ER Status: Positive (+) AJCC 8 Stage Grouping: IA HER2 Status: Positive (+) Oncotype Dx Recurrence Score: Not Appropriate AJCC T Category: pT1c PR Status: Positive (+) Adjuvant Therapy Status: No Adjuvant Therapy Received Yet or Changing Initial Adjuvant Regimen due to Tolerance Intent of Therapy: Curative Intent, Discussed with Patient 

## 2021-04-22 NOTE — Progress Notes (Signed)
Patient here for follow up. No concerns voiced.  °

## 2021-04-27 ENCOUNTER — Inpatient Hospital Stay (HOSPITAL_BASED_OUTPATIENT_CLINIC_OR_DEPARTMENT_OTHER): Payer: Medicare Other | Admitting: Oncology

## 2021-04-27 ENCOUNTER — Encounter: Payer: Self-pay | Admitting: Oncology

## 2021-04-27 ENCOUNTER — Other Ambulatory Visit: Payer: Self-pay

## 2021-04-27 VITALS — BP 127/84 | HR 104 | Temp 97.6°F | Resp 16 | Wt 115.0 lb

## 2021-04-27 DIAGNOSIS — C50919 Malignant neoplasm of unspecified site of unspecified female breast: Secondary | ICD-10-CM

## 2021-04-27 DIAGNOSIS — Z79899 Other long term (current) drug therapy: Secondary | ICD-10-CM | POA: Diagnosis not present

## 2021-04-27 DIAGNOSIS — Z5181 Encounter for therapeutic drug level monitoring: Secondary | ICD-10-CM | POA: Diagnosis not present

## 2021-04-27 DIAGNOSIS — F89 Unspecified disorder of psychological development: Secondary | ICD-10-CM | POA: Insufficient documentation

## 2021-04-27 DIAGNOSIS — R569 Unspecified convulsions: Secondary | ICD-10-CM | POA: Insufficient documentation

## 2021-04-27 DIAGNOSIS — Z136 Encounter for screening for cardiovascular disorders: Secondary | ICD-10-CM | POA: Diagnosis not present

## 2021-04-27 DIAGNOSIS — Z5112 Encounter for antineoplastic immunotherapy: Secondary | ICD-10-CM | POA: Diagnosis not present

## 2021-04-27 LAB — SURGICAL PATHOLOGY

## 2021-04-27 MED ORDER — ONDANSETRON HCL 8 MG PO TABS: 8.0000 mg | ORAL_TABLET | Freq: Two times a day (BID) | ORAL | 1 refills | Status: AC | PRN

## 2021-04-27 NOTE — Progress Notes (Signed)
Hematology/Oncology follow up  note [facility] Telephone:([phone] Fax:([phone]) [phone]   Patient Care Team: Kirk Ruths, MD as PCP - General (Internal Medicine)  REFERRING PROVIDER: Kirk Ruths, MD  CHIEF COMPLAINTS/REASON FOR VISIT:  Follow up for treatment of breast cancer  HISTORY OF PRESENTING ILLNESS:   Anna Banks is a  45 y.o.  female with PMH listed below was seen in consultation at the request of  Kirk Ruths, MD  for evaluation of breast cancer.   Patient was accompanied by her half sister today. Per sister, patient has history of seizure since childhood and has intellectual/cognitive impairment. She is illiterate. She follows instructions from her family members.  She lives with her mother and her half sister who takes of her and helps her to make medical decision.   They waived interpretor. Both patient and her half sister speak [place].  Menarche 77 years of age. G0P0. No child, no previous pregnancy. Per her sister, patient is not sexually active No prior use of birth control pills or hormone replacement therapy. Denies previous history of biopsy.  02/16/2021, screening mammogram showed calcification in the right breast needs further evaluation.  Left breast possible mass with distortion requires further evaluation. 02/26/2021, left breast showed a 10 mm indeterminate mass, 2:00, 8 cm from nipple.  No mammographic evidence of malignancy in the right breast.  No left axillary lymphadenopathy. 03/10/2021, patient underwent ultrasound-guided biopsy of left breast mass.  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  ER low positive [1-10%], PR positive [1-10%], HER2 positive by IHC 3+  Patient has met surgeon Dr. Lysle Pearl today.  She presents to establish care with oncology.  INTERVAL HISTORY Anna Lundquist is a 45 y.o. female who has above history  reviewed by me today presents for follow up visit for  Breast cancer management. Patient had surgery done.she was asked to come back today with her sister and family members for discussion of chemotherapy plan.  She was accompanied by her mother [non English speaking] and her nephew Shanon Brow.  Patient sister Suanne Marker was called and is able to participate in discussion.  [employer] aid/development worker service was utilized for the entire encounter.   Review of Systems  Unable to perform ROS: Other (ntellectual/Cognitive impairment)  Constitutional:  Negative for appetite change, chills, fatigue and fever.  HENT:   Negative for hearing loss and voice change.   Eyes:  Negative for eye problems.  Respiratory:  Negative for chest tightness and cough.   Cardiovascular:  Negative for chest pain.  Gastrointestinal:  Negative for abdominal distention, abdominal pain and blood in stool.  Endocrine: Negative for hot flashes.  Genitourinary:  Negative for difficulty urinating and frequency.   Musculoskeletal:  Negative for arthralgias.  Skin:  Negative for itching and rash.  Neurological:  Negative for extremity weakness.  Hematological:  Negative for adenopathy.  Psychiatric/Behavioral:  Negative for confusion.    MEDICAL HISTORY:  Past Medical History:  Diagnosis Date   Down syndrome    Seizure [facility])     SURGICAL HISTORY: Past Surgical History:  Procedure Laterality Date   BREAST BIOPSY Left 03/10/2021   [place] BX,ribbon clip, IMC   BREAST LUMPECTOMY,RADIO FREQ LOCALIZER,AXILLARY SENTINEL LYMPH NODE BIOPSY Left 04/09/2021   Procedure: BREAST LUMPECTOMY,RADIO FREQ LOCALIZER,AXILLARY SENTINEL LYMPH NODE BIOPSY;  Surgeon: Benjamine Sprague, DO;  Location: [facility] ORS;  Service: General;  Laterality: Left;    SOCIAL HISTORY: Social History  Socioeconomic History   Marital status: Single    Spouse name: Not on file   Number of children: Not on file   Years of education: Not on file   Highest education  level: Not on file  Occupational History   Not on file  Tobacco Use   Smoking status: Never   Smokeless tobacco: Never  Vaping Use   Vaping Use: Never used  Substance and Sexual Activity   Alcohol use: Never   Drug use: Not Currently   Sexual activity: Not on file  Other Topics Concern   Not on file  Social History Narrative   Not on file   Social Determinants of Health   Financial Resource Strain: Not on file  Food Insecurity: Not on file  Transportation Needs: Not on file  Physical Activity: Not on file  Stress: Not on file  Social Connections: Not on file  Intimate Partner Violence: Not on file    FAMILY HISTORY: History reviewed. No pertinent family history.  ALLERGIES:  has No Known Allergies.  MEDICATIONS:  Current Outpatient Medications  Medication Sig Dispense Refill   acetaminophen (TYLENOL) 325 MG tablet Take 325-650 mg by mouth every 6 (six) hours as needed for moderate pain.     ethosuximide (ZARONTIN) 250 MG capsule Take 250 mg by mouth 2 (two) times daily.     HYDROcodone-acetaminophen (NORCO) 5-325 MG tablet Take 1 tablet by mouth every 6 (six) hours as needed for up to 6 doses for moderate pain. 6 tablet 0   ibuprofen (ADVIL) 800 MG tablet Take 1 tablet (800 mg total) by mouth every 8 (eight) hours as needed for mild pain or moderate pain. 30 tablet 0   VITAMIN D PO Take 1 capsule by mouth daily.     No current facility-administered medications for this visit.     PHYSICAL EXAMINATION: ECOG PERFORMANCE STATUS: 0 - Asymptomatic Vitals:   04/27/21 1519  BP: 127/84  Pulse: (!) 104  Resp: 16  Temp: 97.6 F (36.4 C)  SpO2: 100%   Filed Weights   04/27/21 1519  Weight: 115 lb (52.2 kg)    Physical Exam Constitutional:      General: She is not in acute distress. HENT:     Head: Normocephalic and atraumatic.  Eyes:     General: No scleral icterus. Cardiovascular:     Rate and Rhythm: Normal rate and regular rhythm.     Heart sounds:  Normal heart sounds.  Pulmonary:     Effort: Pulmonary effort is normal. No respiratory distress.     Breath sounds: No wheezing.  Abdominal:     General: Bowel sounds are normal. There is no distension.     Palpations: Abdomen is soft.  Musculoskeletal:        General: No deformity. Normal range of motion.     Cervical back: Normal range of motion and neck supple.  Skin:    General: Skin is warm and dry.     Findings: No erythema or rash.  Neurological:     Mental Status: She is alert. Mental status is at baseline.     Cranial Nerves: No cranial nerve deficit.     Coordination: Coordination normal.  Psychiatric:        Mood and Affect: Mood normal.   .   LABORATORY DATA:  I have reviewed the data as listed Lab Results  Component Value Date   WBC 4.9 03/23/2021   HGB 14.4 03/23/2021   HCT 43.2 03/23/2021  MCV 94.9 03/23/2021   PLT 320 03/23/2021   Recent Labs    03/23/21 1006  NA 137  K 4.0  CL 105  CO2 25  GLUCOSE 99  BUN 10  CREATININE 0.58  CALCIUM 9.4  GFRNONAA >60  PROT 8.2*  ALBUMIN 4.4  AST 25  ALT 18  ALKPHOS 48  BILITOT 0.5    Iron/TIBC/Ferritin/ %Sat No results found for: IRON, TIBC, FERRITIN, IRONPCTSAT    RADIOGRAPHIC STUDIES: I have personally reviewed the radiological images as listed and agreed with the findings in the report. NM Sentinel Node Inj-No Rpt (Breast)  Result Date: 04/09/2021 Sulfur Colloid was injected by the Nuclear Medicine Technologist for sentinel lymph node localization.   MM Breast Surgical Specimen  Result Date: 04/09/2021 CLINICAL DATA:  Patient status post left lumpectomy. EXAM: SPECIMEN RADIOGRAPH OF THE LEFT BREAST COMPARISON:  Previous exam(s). FINDINGS: Status post excision of the left breast. The tag and biopsy marker clip are present and completely intact. IMPRESSION: Specimen radiograph of the left breast. Electronically Signed   By: Lovey Newcomer M.D.   On: 04/09/2021 13:14  MM DIAG BREAST TOMO UNI  LEFT  Result Date: 04/06/2021 CLINICAL DATA:  Status post ultrasound-guided placement of a radiofrequency tag adjacent to a biopsy-proven invasive carcinoma in the LEFT breast at the 2 o'clock axis. EXAM: DIAGNOSTIC LEFT MAMMOGRAM POST ultrasound-guided radiofrequency tag localization. COMPARISON:  Previous exam(s). FINDINGS: Mammographic images were obtained following ultrasound guided radiofrequency tag localization of the LEFT breast mass at the 2 o'clock axis, 8 cm from the nipple. The radiofrequency tag is positioned just lateral to the ribbon shaped clip which demarcates the position of the LEFT breast mass. IMPRESSION: Appropriate positioning of the radiofrequency tag slightly superior-lateral to the LEFT breast mass (which contains a ribbon shaped clip) at the 2 o'clock axis. Final Assessment: Post Procedure Mammograms for Marker Placement Electronically Signed   By: Franki Cabot M.D.   On: 04/06/2021 16:48  [place] LT RADIO FREQUENCY TAG LOC US GUIDE  Result Date: 04/06/2021 CLINICAL DATA:  Patient with a biopsy-proven invasive mammary carcinoma in the LEFT breast at the 2 o'clock axis which is scheduled for lumpectomy requiring preoperative radiofrequency tag localization. EXAM: Radiofrequency tag LOCALIZATION OF THE LEFT BREAST WITH ULTRASOUND GUIDANCE COMPARISON:  Previous exams. FINDINGS: Patient presents for radiofrequency tag localization prior to lumpectomy. I met with the patient and we discussed the procedure of localization including benefits and alternatives. We discussed the high likelihood of a successful procedure. We discussed the risks of the procedure, including infection, bleeding, tissue injury, and further surgery. Informed, written consent was given. The usual time-out protocol was performed immediately prior to the procedure. Using ultrasound guidance, sterile technique, 1% lidocaine and a 7 cm radiofrequency tag needle, the mass in the LEFT breast at the 2 o'clock axis, 8 cm from  the nipple, was localized using a lateral approach. Reflector function was confirmed with an auditory signal. The images were marked for [place]. IMPRESSION: Radar reflector localization of the left breast. No apparent complications. Electronically Signed   By: Franki Cabot M.D.   On: 04/06/2021 16:27     ASSESSMENT & PLAN:  1. Invasive carcinoma of breast ([place])   2. Encounter for monitoring cardiotoxic drug therapy   3. Malignant neoplasm of female breast, unspecified estrogen receptor status, unspecified laterality, unspecified site of breast ([place])   4. Screening for cardiovascular condition   Cancer Staging Invasive carcinoma of breast ([place]) Staging form: Breast, AJCC 8th Edition - Pathologic  stage from 04/22/2021: Stage IA (pT1c, pN0, cM0, G2, ER+, PR+, HER2+) - Signed by Earlie Server, MD on 04/22/2021   Cancer Staging Invasive carcinoma of breast [facility]) Staging form: Breast, AJCC 8th Edition - Pathologic stage from 04/22/2021: Stage IA (pT1c, pN0, cM0, G2, ER+, PR+, HER2+) - Signed by Earlie Server, MD on 04/22/2021  #Left invasive carcinoma of breast, HER2 positive breast cancer, ER 11-50% positive,  PR 11-50% positive.  04/09/2021, status postlumpectomy and sentinel lymph node biopsy. Invasive mammary carcinoma, no special type, 4 lymph nodes all negative for metastatic carcinoma.  DCIS present.  LVI not identified.Grade 2, all margins negative for invasive carcinoma. pT1c pN0, ER+ PR +, HER 2   I had a lengthy discussion with patient and her family members, including nephew Shanon Brow, Mother and her sister Lynelle Doctor phone].  I recommend adjuvant chemotherapy with Taxol weekly+ Transtuzumab x 12 weeks, followed by Transtuzumab Q3 qweeks x 1 year.  The diagnosis and care plan were discussed with patient in detail.  Chemotherapy education was provided.  We had discussed the composition of chemotherapy regimen, length of chemo cycle, duration of treatment and the time to assess response to treatment.   I  explained to the patient the risks and benefits of chemotherapy including all but not limited to hair loss, mouth sore, nausea, vomiting, diarrhea, low blood counts, bleeding, heart failure, neuropathy and risk of life threatening infection and even death, secondary malignancy etc.  Patient and family voice understanding and willing to proceed chemotherapy.  Patient is single and is not sexually active. She and her family members feel there is no need for fertility preservation.  She will also need adjuvant RT and adjuvant anti estrogen treatments.   # Chemotherapy education; check baseline heart function.   Antiemetics-Zofran and Compazine; EMLA cream sent to pharmacy  Supportive care measures are necessary for patient well-being and will be provided as necessary. We spent sufficient time to discuss many aspect of care, questions were answered to patient's satisfaction.     All questions were answered. The patient knows to call the clinic with any problems questions or concerns.  cc Kirk Ruths, MD    Earlie Server, MD, PhD. Hematology [place] [facility] Pager- [phone] 04/27/2021

## 2021-04-29 ENCOUNTER — Inpatient Hospital Stay (HOSPITAL_BASED_OUTPATIENT_CLINIC_OR_DEPARTMENT_OTHER): Payer: Medicare Other | Admitting: Hospice and Palliative Medicine

## 2021-04-29 DIAGNOSIS — C50919 Malignant neoplasm of unspecified site of unspecified female breast: Secondary | ICD-10-CM

## 2021-04-29 NOTE — Progress Notes (Signed)
Multidisciplinary Oncology Council Documentation  Anna Banks was presented by our [facility] on 04/29/2021, which included representatives from:  Kinde Dietitian  Physical/Occupational Therapist Nurse Navigator Genetics Speech Therapist Social work Survivorship Therapist, occupational Research RN [employer] currently presents with history of breast cancer  We reviewed previous medical and familial history, history of present illness, and recent lab results along with all available histopathologic and imaging studies. The [place] considered available treatment options and made the following recommendations/referrals:  N/A  The MOC is a meeting of clinicians from various specialty areas who evaluate and discuss patients for whom a multidisciplinary approach is being considered. Final determinations in the plan of care are those of the provider(s).   Today's extended care, comprehensive team conference, Anna was not present for the discussion and was not examined.

## 2021-04-30 ENCOUNTER — Inpatient Hospital Stay: Payer: Medicare Other

## 2021-04-30 ENCOUNTER — Other Ambulatory Visit: Payer: Self-pay

## 2021-05-06 ENCOUNTER — Ambulatory Visit
Admission: RE | Admit: 2021-05-06 | Discharge: 2021-05-06 | Disposition: A | Discharge status: Home / Self Care | Payer: Medicare Other | Source: Ambulatory Visit | Attending: Oncology | Admitting: Oncology

## 2021-05-06 DIAGNOSIS — C50919 Malignant neoplasm of unspecified site of unspecified female breast: Secondary | ICD-10-CM | POA: Diagnosis not present

## 2021-05-06 DIAGNOSIS — Q909 Down syndrome, unspecified: Secondary | ICD-10-CM | POA: Diagnosis not present

## 2021-05-06 DIAGNOSIS — Z79899 Other long term (current) drug therapy: Secondary | ICD-10-CM | POA: Insufficient documentation

## 2021-05-06 DIAGNOSIS — Z0189 Encounter for other specified special examinations: Secondary | ICD-10-CM | POA: Diagnosis not present

## 2021-05-06 DIAGNOSIS — Z5181 Encounter for therapeutic drug level monitoring: Secondary | ICD-10-CM | POA: Insufficient documentation

## 2021-05-06 LAB — ECHOCARDIOGRAM COMPLETE
AR max vel: 1.65 cm2
AV Area VTI: 1.69 cm2
AV Area mean vel: 1.56 cm2
AV Mean grad: 5 mmHg
AV Peak grad: 8.5 mmHg
Ao pk vel: 1.46 m/s
Area-P 1/2: 3.7 cm2
MV VTI: 2 cm2
S' Lateral: 2.43 cm

## 2021-05-06 NOTE — Progress Notes (Signed)
*  PRELIMINARY RESULTS* Echocardiogram 2D Echocardiogram has been performed.  Anna Banks 05/06/2021, 10:01 AM

## 2021-05-08 ENCOUNTER — Inpatient Hospital Stay: Payer: Medicare Other

## 2021-05-08 ENCOUNTER — Encounter: Payer: Self-pay | Admitting: Oncology

## 2021-05-08 ENCOUNTER — Inpatient Hospital Stay (HOSPITAL_BASED_OUTPATIENT_CLINIC_OR_DEPARTMENT_OTHER): Payer: Medicare Other | Admitting: Oncology

## 2021-05-08 VITALS — BP 113/76 | HR 95 | Temp 98.0°F | Resp 20 | Wt 118.3 lb

## 2021-05-08 VITALS — BP 111/69 | HR 100 | Temp 97.0°F | Resp 18

## 2021-05-08 DIAGNOSIS — C50919 Malignant neoplasm of unspecified site of unspecified female breast: Secondary | ICD-10-CM | POA: Diagnosis not present

## 2021-05-08 DIAGNOSIS — Z5112 Encounter for antineoplastic immunotherapy: Secondary | ICD-10-CM | POA: Diagnosis not present

## 2021-05-08 DIAGNOSIS — Z5111 Encounter for antineoplastic chemotherapy: Secondary | ICD-10-CM

## 2021-05-08 LAB — CBC WITH DIFFERENTIAL/PLATELET
Abs Immature Granulocytes: 0.02 10*3/uL (ref 0.00–0.07)
Basophils Absolute: 0.1 10*3/uL (ref 0.0–0.1)
Basophils Relative: 1 %
Eosinophils Absolute: 0.6 10*3/uL — ABNORMAL HIGH (ref 0.0–0.5)
Eosinophils Relative: 9 %
HCT: 40.9 % (ref 36.0–46.0)
Hemoglobin: 13.7 g/dL (ref 12.0–15.0)
Immature Granulocytes: 0 %
Lymphocytes Relative: 39 %
Lymphs Abs: 2.6 10*3/uL (ref 0.7–4.0)
MCH: 31.9 pg (ref 26.0–34.0)
MCHC: 33.5 g/dL (ref 30.0–36.0)
MCV: 95.1 fL (ref 80.0–100.0)
Monocytes Absolute: 0.5 10*3/uL (ref 0.1–1.0)
Monocytes Relative: 8 %
Neutro Abs: 2.9 10*3/uL (ref 1.7–7.7)
Neutrophils Relative %: 43 %
Platelets: 343 10*3/uL (ref 150–400)
RBC: 4.3 MIL/uL (ref 3.87–5.11)
RDW: 12.3 % (ref 11.5–15.5)
WBC: 6.6 10*3/uL (ref 4.0–10.5)
nRBC: 0 % (ref 0.0–0.2)

## 2021-05-08 LAB — COMPREHENSIVE METABOLIC PANEL
ALT: 17 U/L (ref 0–44)
AST: 25 U/L (ref 15–41)
Albumin: 4.3 g/dL (ref 3.5–5.0)
Alkaline Phosphatase: 54 U/L (ref 38–126)
Anion gap: 8 (ref 5–15)
BUN: 11 mg/dL (ref 6–20)
CO2: 23 mmol/L (ref 22–32)
Calcium: 9.3 mg/dL (ref 8.9–10.3)
Chloride: 105 mmol/L (ref 98–111)
Creatinine, Ser: 0.63 mg/dL (ref 0.44–1.00)
GFR, Estimated: >60 mL/min (ref >60)
Glucose, Bld: 156 mg/dL — ABNORMAL HIGH (ref 70–99)
Potassium: 3.6 mmol/L (ref 3.5–5.1)
Sodium: 136 mmol/L (ref 135–145)
Total Bilirubin: 0.2 mg/dL — ABNORMAL LOW (ref 0.3–1.2)
Total Protein: 7.6 g/dL (ref 6.5–8.1)

## 2021-05-08 LAB — PREGNANCY, URINE: Preg Test, Ur: NEGATIVE

## 2021-05-08 MED ORDER — ACETAMINOPHEN 325 MG PO TABS
650.0000 mg | ORAL_TABLET | Freq: Once | ORAL | Status: AC
  Administered 2021-05-08: 650 mg via ORAL
  Filled 2021-05-08: qty 2

## 2021-05-08 MED ORDER — SODIUM CHLORIDE 0.9 % IV SOLN
80.0000 mg/m2 | Freq: Once | INTRAVENOUS | Status: AC
  Administered 2021-05-08: 120 mg via INTRAVENOUS
  Filled 2021-05-08: qty 20

## 2021-05-08 MED ORDER — DIPHENHYDRAMINE HCL 50 MG/ML IJ SOLN
50.0000 mg | Freq: Once | INTRAMUSCULAR | Status: AC
  Administered 2021-05-08: 50 mg via INTRAVENOUS
  Filled 2021-05-08: qty 1

## 2021-05-08 MED ORDER — SODIUM CHLORIDE 0.9 % IV SOLN
10.0000 mg | Freq: Once | INTRAVENOUS | Status: AC
  Administered 2021-05-08: 10 mg via INTRAVENOUS
  Filled 2021-05-08: qty 10

## 2021-05-08 MED ORDER — SODIUM CHLORIDE 0.9 % IV SOLN
Freq: Once | INTRAVENOUS | Status: AC
  Filled 2021-05-08: qty 250

## 2021-05-08 MED ORDER — TRASTUZUMAB-DKST CHEMO 150 MG IV SOLR
4.0000 mg/kg | Freq: Once | INTRAVENOUS | Status: AC
  Administered 2021-05-08: 210 mg via INTRAVENOUS
  Filled 2021-05-08: qty 10

## 2021-05-08 MED ORDER — FAMOTIDINE 20 MG IN NS 100 ML IVPB
20.0000 mg | Freq: Once | INTRAVENOUS | Status: AC
  Administered 2021-05-08: 20 mg via INTRAVENOUS
  Filled 2021-05-08: qty 20

## 2021-05-08 NOTE — Progress Notes (Signed)
[id number]: Chemotherapy Treatment plan reviewed with pt and nephew (with interpreter Mia [phone] present) , including what to expect during and after treatment. Pt and Nephew verbalize understanding and consent to moving forward with treatment. Consent signed by Nephew.    1100: Per Benjamine Mola RN per Dr. Tasia Catchings pt to have urine pregnancy collected, pt currently on Trastuzamab and to receive Taxol next. Per Benjamine Mola RN per Dr. Tasia Catchings, pt reports that she is NOT sexually active, and it is okay to proceed with treatment prior to urine collection and results. Will collect once pt is able to give a urine sample.

## 2021-05-08 NOTE — Progress Notes (Signed)
Patient states no concerns at the moment. 

## 2021-05-08 NOTE — Progress Notes (Signed)
Hematology/Oncology follow up  note [facility] Telephone:([phone] [phone]   Patient Care Team: Kirk Ruths, MD as PCP - General (Internal Medicine)  REFERRING PROVIDER: Kirk Ruths, MD  CHIEF COMPLAINTS/REASON FOR VISIT:  Follow up for treatment of breast cancer  HISTORY OF PRESENTING ILLNESS:   Anna Banks is a  45 y.o.  female with PMH listed below was seen in consultation at the request of  Kirk Ruths, MD  for evaluation of breast cancer.   Patient was accompanied by her half sister today. Per sister, patient has history of seizure since childhood and has intellectual/cognitive impairment. She is illiterate. She follows instructions from her family members.  She lives with her mother and her half sister who takes of her and helps her to make medical decision.   They waived interpretor. Both patient and her half sister speak [place].  Menarche 48 years of age. G0P0. No child, no previous pregnancy. Per her sister, patient is not sexually active No prior use of birth control pills or hormone replacement therapy. Denies previous history of biopsy.  02/16/2021, screening mammogram showed calcification in the right breast needs further evaluation.  Left breast possible mass with distortion requires further evaluation. 02/26/2021, left breast showed a 10 mm indeterminate mass, 2:00, 8 cm from nipple.  No mammographic evidence of malignancy in the right breast.  No left axillary lymphadenopathy. 03/10/2021, patient underwent ultrasound-guided biopsy of left breast mass.  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  ER low positive [1-10%], PR positive [1-10%], HER2 positive by IHC 3+  # 04/09/2021, status postlumpectomy and sentinel lymph node biopsy. Invasive mammary carcinoma, no special type, 4 lymph nodes all negative for metastatic carcinoma.   DCIS present.  LVI not identified.Grade 2, all margins negative for invasive carcinoma. pT1c pN0, ER[staining was repeated on surgical specimen] [11-50%]+ PR [1-10%]+, HER 2+  INTERVAL HISTORY Anna dung Levengood is a 45 y.o. female who has above history reviewed by me today presents for follow up visit for  Breast cancer management. Patient is accompanied by her nephew Shanon Brow.  Patient had a Mom has had chemotherapy education. Patient has no new complaints.  Review of Systems  Unable to perform ROS: Other (intellectual/Cognitive impairment)  Constitutional:  Negative for appetite change, chills, fatigue and fever.  HENT:   Negative for hearing loss and voice change.   Eyes:  Negative for eye problems.  Respiratory:  Negative for chest tightness and cough.   Cardiovascular:  Negative for chest pain.  Gastrointestinal:  Negative for abdominal distention, abdominal pain and blood in stool.  Endocrine: Negative for hot flashes.  Genitourinary:  Negative for difficulty urinating and frequency.   Musculoskeletal:  Negative for arthralgias.  Skin:  Negative for itching and rash.  Neurological:  Negative for extremity weakness.  Hematological:  Negative for adenopathy.  Psychiatric/Behavioral:  Negative for confusion.    MEDICAL HISTORY:  Past Medical History:  Diagnosis Date   Down syndrome    Seizure [facility])     SURGICAL HISTORY: Past Surgical History:  Procedure Laterality Date   BREAST BIOPSY Left 03/10/2021   [place] BX,ribbon clip, IMC   BREAST LUMPECTOMY,RADIO FREQ LOCALIZER,AXILLARY SENTINEL LYMPH NODE BIOPSY Left 04/09/2021   Procedure: BREAST LUMPECTOMY,RADIO FREQ LOCALIZER,AXILLARY SENTINEL LYMPH NODE BIOPSY;  Surgeon: Benjamine Sprague, DO;  Location: [facility] ORS;  Service: General;  Laterality: Left;    SOCIAL HISTORY: Social History  Socioeconomic History   Marital status: Single    Spouse name: Not on file   Number of children: Not on file   Years of education: Not on file   Highest  education level: Not on file  Occupational History   Not on file  Tobacco Use   Smoking status: Never   Smokeless tobacco: Never  Vaping Use   Vaping Use: Never used  Substance and Sexual Activity   Alcohol use: Never   Drug use: Not Currently   Sexual activity: Not on file  Other Topics Concern   Not on file  Social History Narrative   Not on file   Social Determinants of Health   Financial Resource Strain: Not on file  Food Insecurity: Not on file  Transportation Needs: Not on file  Physical Activity: Not on file  Stress: Not on file  Social Connections: Not on file  Intimate Partner Violence: Not on file    FAMILY HISTORY: History reviewed. No pertinent family history.  ALLERGIES:  has No Known Allergies.  MEDICATIONS:  Current Outpatient Medications  Medication Sig Dispense Refill   acetaminophen (TYLENOL) 325 MG tablet Take 325-650 mg by mouth every 6 (six) hours as needed for moderate pain.     ethosuximide (ZARONTIN) 250 MG capsule Take 250 mg by mouth 2 (two) times daily.     HYDROcodone-acetaminophen (NORCO) 5-325 MG tablet Take 1 tablet by mouth every 6 (six) hours as needed for up to 6 doses for moderate pain. 6 tablet 0   ibuprofen (ADVIL) 800 MG tablet Take 1 tablet (800 mg total) by mouth every 8 (eight) hours as needed for mild pain or moderate pain. 30 tablet 0   ondansetron (ZOFRAN) 8 MG tablet Take 1 tablet (8 mg total) by mouth 2 (two) times daily as needed (Nausea or vomiting). 30 tablet 1   VITAMIN D PO Take 1 capsule by mouth daily.     No current facility-administered medications for this visit.     PHYSICAL EXAMINATION: ECOG PERFORMANCE STATUS: 0 - Asymptomatic Vitals:   05/08/21 0837  BP: 113/76  Pulse: 95  Resp: 20  Temp: 98 F (36.7 C)  SpO2: 100%   Filed Weights   05/08/21 0837  Weight: 118 lb 4.8 oz (53.7 kg)    Physical Exam Constitutional:      General: She is not in acute distress. HENT:     Head: Normocephalic and  atraumatic.  Eyes:     General: No scleral icterus. Cardiovascular:     Rate and Rhythm: Normal rate and regular rhythm.     Heart sounds: Normal heart sounds.  Pulmonary:     Effort: Pulmonary effort is normal. No respiratory distress.     Breath sounds: No wheezing.  Abdominal:     General: Bowel sounds are normal. There is no distension.     Palpations: Abdomen is soft.  Musculoskeletal:        General: No deformity. Normal range of motion.     Cervical back: Normal range of motion and neck supple.  Skin:    General: Skin is warm and dry.     Findings: No erythema or rash.  Neurological:     Mental Status: She is alert. Mental status is at baseline.     Cranial Nerves: No cranial nerve deficit.     Coordination: Coordination normal.  Psychiatric:        Mood and Affect: Mood normal.   .   LABORATORY DATA:  I  have reviewed the data as listed Lab Results  Component Value Date   WBC 6.6 05/08/2021   HGB 13.7 05/08/2021   HCT 40.9 05/08/2021   MCV 95.1 05/08/2021   PLT 343 05/08/2021   Recent Labs    03/23/21 1006 05/08/21 0809  NA 137 136  K 4.0 3.6  CL 105 105  CO2 25 23  GLUCOSE 99 156*  BUN 10 11  CREATININE 0.58 0.63  CALCIUM 9.4 9.3  GFRNONAA >60 >60  PROT 8.2* 7.6  ALBUMIN 4.4 4.3  AST 25 25  ALT 18 17  ALKPHOS 48 54  BILITOT 0.5 0.2*    Iron/TIBC/Ferritin/ %Sat No results found for: IRON, TIBC, FERRITIN, IRONPCTSAT    RADIOGRAPHIC STUDIES: I have personally reviewed the radiological images as listed and agreed with the findings in the report. NM Sentinel Node Inj-No Rpt (Breast)  Result Date: 04/09/2021 Sulfur Colloid was injected by the Nuclear Medicine Technologist for sentinel lymph node localization.   MM Breast Surgical Specimen  Result Date: 04/09/2021 CLINICAL DATA:  Patient status post left lumpectomy. EXAM: SPECIMEN RADIOGRAPH OF THE LEFT BREAST COMPARISON:  Previous exam(s). FINDINGS: Status post excision of the left breast. The  tag and biopsy marker clip are present and completely intact. IMPRESSION: Specimen radiograph of the left breast. Electronically Signed   By: Lovey Newcomer M.D.   On: 04/09/2021 13:14  ECHOCARDIOGRAM COMPLETE  Result Date: 05/06/2021    ECHOCARDIOGRAM REPORT   Patient Name:   [facility] Tav[facility] Date of Exam: 05/06/2021 Medical Rec #:  [id number]   Height:       61.0 in Accession #:    [id number]  Weight:       115.0 lb Date of Birth:  07/07/1976   BSA:          1.493 m Patient Age:    45 years    BP:           127/84 mmHg Patient Gender: F           HR:           98 bpm. Exam Location:  [facility] Procedure: 2D Echo, Color Doppler, Cardiac Doppler and Strain Analysis Indications:     Z09 Chemotherapy  History:         Patient has no prior history of Echocardiogram examinations.                  Down's syndrome                  Invasive carcinoma of breast                  Encounter for monitoring cardiotoxic drug therapy.  Sonographer:     Charmayne Sheer Referring Phys:  [id number] Eular Panek Diagnosing Phys: Kate Sable MD  Sonographer Comments: Global longitudinal strain was attempted. IMPRESSIONS  1. Left ventricular ejection fraction, by estimation, is 65 to 70%. The left ventricle has normal function. The left ventricle has no regional wall motion abnormalities. Left ventricular diastolic parameters were normal.  2. Right ventricular systolic function is normal. The right ventricular size is normal.  3. The mitral valve is normal in structure. No evidence of mitral valve regurgitation.  4. The aortic valve is grossly normal. Aortic valve regurgitation is not visualized.  5. The inferior vena cava is normal in size with greater than 50% respiratory variability, suggesting right atrial pressure of 3 mmHg. FINDINGS  Left Ventricle: Left ventricular ejection fraction, by estimation,  is 65 to 70%. The left ventricle has normal function. The left ventricle has no regional wall motion abnormalities. Global longitudinal strain  performed but not reported based on interpreter judgement due to suboptimal tracking. The left ventricular internal cavity size was normal in size. There is no left ventricular hypertrophy. Left ventricular diastolic parameters were normal. Right Ventricle: The right ventricular size is normal. No increase in right ventricular wall thickness. Right ventricular systolic function is normal. Left Atrium: Left atrial size was normal in size. Right Atrium: Right atrial size was normal in size. Pericardium: There is no evidence of pericardial effusion. Mitral Valve: The mitral valve is normal in structure. No evidence of mitral valve regurgitation. MV peak gradient, 3.2 mmHg. The mean mitral valve gradient is 2.0 mmHg. Tricuspid Valve: The tricuspid valve is normal in structure. Tricuspid valve regurgitation is not demonstrated. Aortic Valve: The aortic valve is grossly normal. Aortic valve regurgitation is not visualized. Aortic valve mean gradient measures 5.0 mmHg. Aortic valve peak gradient measures 8.5 mmHg. Aortic valve area, by VTI measures 1.69 cm. Pulmonic Valve: The pulmonic valve was not well visualized. Pulmonic valve regurgitation is not visualized. Aorta: The aortic root is normal in size and structure. Venous: The inferior vena cava is normal in size with greater than 50% respiratory variability, suggesting right atrial pressure of 3 mmHg. IAS/Shunts: No atrial level shunt detected by color flow Doppler.  LEFT VENTRICLE PLAX 2D LVIDd:         3.97 cm  Diastology LVIDs:         2.43 cm  LV e' medial:    8.70 cm/s LV PW:         0.63 cm  LV E/e' medial:  10.5 LV IVS:        0.70 cm  LV e' lateral:   12.60 cm/s LVOT diam:     1.70 cm  LV E/e' lateral: 7.2 LV SV:         44 LV SV Index:   30 LVOT Area:     2.27 cm  RIGHT VENTRICLE RV Basal diam:  2.29 cm RV S prime:     14.30 cm/s LEFT ATRIUM           Index       RIGHT ATRIUM          Index LA diam:      2.40 cm 1.61 cm/m  RA Area:     7.06 cm LA Vol  (A4C): 15.1 ml 10.11 ml/m RA Volume:   12.40 ml 8.31 ml/m  AORTIC VALVE                    PULMONIC VALVE AV Area (Vmax):    1.65 cm     PV Vmax:       0.96 m/s AV Area (Vmean):   1.56 cm     PV Vmean:      73.600 cm/s AV Area (VTI):     1.69 cm     PV VTI:        0.191 m AV Vmax:           146.00 cm/s  PV Peak grad:  3.7 mmHg AV Vmean:          101.000 cm/s PV Mean grad:  2.0 mmHg AV VTI:            0.264 m AV Peak Grad:      8.5 mmHg AV Mean Grad:  5.0 mmHg LVOT Vmax:         106.00 cm/s LVOT Vmean:        69.400 cm/s LVOT VTI:          0.196 m LVOT/AV VTI ratio: 0.74  AORTA Ao Root diam: 2.90 cm MITRAL VALVE MV Area (PHT): 3.70 cm    SHUNTS MV Area VTI:   2.00 cm    Systemic VTI:  0.20 m MV Peak grad:  3.2 mmHg    Systemic Diam: 1.70 cm MV Mean grad:  2.0 mmHg MV Vmax:       0.89 m/s MV Vmean:      65.5 cm/s MV Decel Time: 205 msec MV E velocity: 91.30 cm/s MV A velocity: 83.60 cm/s MV E/A ratio:  1.09 Kate Sable MD Electronically signed by Kate Sable MD Signature Date/Time: 05/06/2021/7:01:02 PM    Final       ASSESSMENT & PLAN:  1. Invasive carcinoma of breast ([place])   2. Encounter for antineoplastic chemotherapy   Cancer Staging Invasive carcinoma of breast ([place]) Staging form: Breast, AJCC 8th Edition - Pathologic stage from 04/22/2021: Stage IA (pT1c, pN0, cM0, G2, ER+, PR+, HER2+) - Signed by Earlie Server, MD on 04/22/2021   Cancer Staging Invasive carcinoma of breast [facility]) Staging form: Breast, AJCC 8th Edition - Pathologic stage from 04/22/2021: Stage IA (pT1c, pN0, cM0, G2, ER+, PR+, HER2+) - Signed by Earlie Server, MD on 04/22/2021  #Left invasive carcinoma of breast,pT1c pN0 cM0 HER2 positive ER 11-50% positive,  PR 1-10% positive.   Labs reviewed and discussed with patient. Baseline echocardiogram showed normal LVEF. Proceed with cycle 1 Taxol and trastuzumab. She will also need adjuvant RT and adjuvant anti estrogen treatments after she finishes adjuvant  chemotherapy.Marland Kitchen  antiemetic prescription has been sent.  Discussed about instructions..  Recommend patient to pick up prescription at pharmacy.  Patient is not sexually active.  Patient is incompetent due to cognitive impairment.  I will check hCG urine every 2 weeks prior to the chemo. Supportive care measures are necessary for patient well-being and will be provided as necessary. We spent sufficient time to discuss many aspect of care, questions were answered to patient's satisfaction.     All questions were answered. The patient knows to call the clinic with any problems questions or concerns.  cc Kirk Ruths, MD    Earlie Server, MD, PhD. Hematology [place] [facility]  05/08/2021

## 2021-05-08 NOTE — Patient Instructions (Addendum)
[facility] ONCOLOGY  Discharge Instructions: Thank you for choosing [facility] to provide your oncology and hematology care.  If you have a lab appointment with the [facility], please go directly to the [facility] and check in at the registration area.  Wear comfortable clothing and clothing appropriate for easy access to any Portacath or PICC line.   We strive to give you quality time with your provider. You may need to reschedule your appointment if you arrive late (15 or more minutes).  Arriving late affects you and other patients whose appointments are after yours.  Also, if you miss three or more appointments without notifying the office, you may be dismissed from the clinic at the provider's discretion.      For prescription refill requests, have your pharmacy contact our office and allow 72 hours for refills to be completed.    Today you received the following chemotherapy and/or immunotherapy agents Paclitaxel and trastuzumab    To help prevent nausea and vomiting after your treatment, we encourage you to take your nausea medication as directed.  BELOW ARE SYMPTOMS THAT SHOULD BE REPORTED IMMEDIATELY: *FEVER GREATER THAN 100.4 F (38 C) OR HIGHER *CHILLS OR SWEATING *NAUSEA AND VOMITING THAT IS NOT CONTROLLED WITH YOUR NAUSEA MEDICATION *UNUSUAL SHORTNESS OF BREATH *UNUSUAL BRUISING OR BLEEDING *URINARY PROBLEMS (pain or burning when urinating, or frequent urination) *BOWEL PROBLEMS (unusual diarrhea, constipation, pain near the anus) TENDERNESS IN MOUTH AND THROAT WITH OR WITHOUT PRESENCE OF ULCERS (sore throat, sores in mouth, or a toothache) UNUSUAL RASH, SWELLING OR PAIN  UNUSUAL VAGINAL DISCHARGE OR ITCHING   Items with * indicate a potential emergency and should be followed up as soon as possible or go to the Emergency Department if any problems should occur.  Please show the CHEMOTHERAPY ALERT CARD or IMMUNOTHERAPY ALERT  CARD at check-in to the Emergency Department and triage nurse.  Should you have questions after your visit or need to cancel or reschedule your appointment, please contact [facility]  [phone] and follow the prompts.  Office hours are 8:00 a.m. to 4:30 p.m. Monday - Friday. Please note that voicemails left after 4:00 p.m. may not be returned until the following business day.  We are closed weekends and major holidays. You have access to a nurse at all times for urgent questions. Please call the main number to the clinic [phone] and follow the prompts.  For any non-urgent questions, you may also contact your provider using MyChart. We now offer e-Visits for anyone 61 and older to request care online for non-urgent symptoms. For details visit [email or web address].   Also download the MyChart app! Go to the app store, search "MyChart", open the app, select [facility], and log in with your MyChart username and password.  Due to Covid, a mask is required upon entering the hospital/clinic. If you do not have a mask, one will be given to you upon arrival. For doctor visits, patients may have 1 support person aged 66 or older with them. For treatment visits, patients cannot have anyone with them due to current Covid guidelines and our immunocompromised population.    Paclitaxel injection ?y l thu?c g? PACLITAXEL l thu?c ha tr? li?u. N nh?m ??n cc t? bo phn chia nhanh, nh? cc t? bo ung th?, v lm cho cc t? bo ? ch?t. Thu?c ny ???c dng ?? ?i?u tr? ung th? bu?ng tr?ng, ung th? v, ung th? ph?i, ung th?  Kaposi (Kaposi'ssarcoma) v cc b?nh ung th? khc. Thu?c ny c th? ???c dng cho nh?ng m?c ?ch khc; hy h?i ng??i cung c?p d?chv? y t? ho?c d??c s? c?a mnh, n?u qu v? c th?c m?c. (CC) NHN HI?U PH? BI?N: Onxol, Taxol Ti c?n ph?i bo cho ng??i cung c?p d?ch v? y t? c?a mnh ?i?u g tr??c khidng thu?c ny? H? c?n bi?t li?u qu v? c b?t k?  tnh tr?ng no sau ?y khng: ti?n s? tim ??p khng ??u b?nh gan s? l??ng t? bo mu th?p, ch?ng h?n nh? s? l??ng b?ch c?u, ti?u c?u, ho?c h?ng c?u th?p b?nh ph?i ho??c h h?p, ch??ng ha?n nh? hen suy?n c?m gic nh? b? ki?n b ? ngn tay ho?c ngn chn, ho?c cc r?i lo?n th?n kinh khc c pha?n ??ng b?t th???ng ho??c di? ??ng v??i paclitaxel, c?n, d?u castor nh? ha (polyoxyethylated) ho?c ha tr? li?u khc pha?n ??ng b?t th???ng ho??c di? ??ng v??i ca?c d??c ph?m kha?c, th?c ph?m, thu?c nhu?m, ho??c ch?t ba?o qua?n ?ang c thai ho??c ??nh co? thai ?ang cho con bu? Ti nn s? d?ng thu?c ny nh? th? no? Thu?c ny ?? truy?n vo t?nh m?ch. Thu?c ny ???c cho trong b?nh vi?n ho?cphng m?ch b?i chuyn vin y t? ???c hu?n luy?n ??c bi?t. Hy bn v?i bc s? nhi khoa c?a qu v? v? vi?c dng thu?c ny ? tr? em. C th?c?n ch?m Ovid ??c bi?t. Qu li?u: N?u qu v? cho r?ng mnh ? dng qu nhi?u thu?c ny, th hy linl?c v?i trung tm ki?m sot ch?t ??c ho?c phng c?p c?u ngay l?p t?c. L?U : Thu?c ny ch? dnh ring cho qu v?. Khng chia s? thu?c ny v?i nh?ngng??i khc. N?u ti l? qun m?t li?u th sao? ?i?u quan tr?ng l khng nn b? l? li?u thu?c no. Hy lin l?c v?i bc s? ho?cchuyn vin y t? c?a mnh, n?u qu v? khng th? gi? ?ng cu?c h?n khm. Nh?ng g c th? t??ng tc v?i thu?c ny? Khng ???c dng thu?c ny cng v?i b?t k? th? no sau ?y: cc thu?c ch?ng ng?a d?ng virus s?ng Thu?c ny c?ng c th? t??ng tc v?i cc thu?c sau ?y: ca?c thu?c kha?ng virus dng ?? tr? vim gan, nhi?m HIV ho?c AIDS m?t s? thu?c khng sinh, ch?ng h?n nh? erythromycin v clarithromycin m?t s? thu?c dng ?? tr? cc b?nh nhi?m n?m, ch?ng h?n nh? itraconazole, ketoconazole m?t s? thu?c dng cho cc ch?ng co gi?t, ch?ng h?n nh? carbamazepine, phenobarbital, phenytoin gemfibrozil nefazodone rifampicin cy [facility] (c? [facility]/cy n?c s?i/cy l?nh) Danh sch ny c th? khng m t? ?? h?t cc t??ng  tc c th? x?y ra. Hy ??a cho ng??i cung c?p d?ch v? y t? c?a mnh danh sch t?t c? cc thu?c, th?o d??c, cc thu?c khng c?n toa, ho?c cc ch? ph?m b? sung m qu v? dng. C?ng nn bo cho h? bi?t r?ng qu v? c ht thu?c, u?ng r??u, ho?c c s? d?ng ma ty triphp hay khng. Vi th? c th? t??ng tc v?i thu?c c?a qu v?. Ti c?n ph?i theo di ?i?u g trong khi dng thu?c ny? Qu v? s? ???c theo di ch?t ch? trong khi dng thu?c ny. Qu v? s? c?n ph?i?i lm cc xt nghi?m mu quan tr?ng trong th?i gian dng thu?c ny. Thu?c ny c th? gy ra nh?ng ph?n ?ng d? ?ng nghim tr?ng. ?? gi?m nguy c?, qu v? s? c?n ph?i dng (cc)  thu?c khc tr??c khi ?i?u tr? b?ng thu?c ny. N?u qu v? b? cc ph?n ?ng d? ?ng, ch?ng h?n nh? da m?n ??, ng?a ho?c n?i my ?ay, s?ng ? m?t, mi ho?c l??i, th hy bo ngay cho bc s? ho?c chuyn vin y t?c?a mnh. Trong m?t s? tr??ng h?p, qu v? c th? ???c cho dng cc thu?c ph? thm ?? gipgi?m tc d?ng ph?. Hy lm theo t?t c? cc h??ng d?n v? vi?c s? d?ng chng. Thu?c ny c th? lm cho qu v? c?m th?y khng ???c kh?e nh? th??ng l?. ?i?u ny khng ph?i khng ph? bi?n, b?i v thu?c ha tr? li?u c th? ?nh h??ng ??n c? t? bo lnh l?n t? bo ung th?. Hy t??ng trnh m?i tc d?ng ph?. Hy ti?p t?c ??t ?i?u tr? c?a mnh ngay c? khi qu v? c?m th?y m?t, tr? khi bc s? yuc?u qu v? ng?ng ?i?u tr?. Hy h?i  ki?n bc s? ho?c chuyn vin y t?, n?u qu v? b? s?t, ?n l?nh ho?c ?au h?ng, ho?c c cc tri?u ch?ng khc c?a c?m l?nh ho?c cm. Khng ???c t? ?i?u tr? cho mnh. Thu?c ny c th? lm gi?m kh? n?ng ch?ng l?i cc b?nh nhi?mtrng c?a c? th?. Hy c? trnh ? g?n nh?ng ng??i b? b?nh. Thu?c ny c th? lm t?ng nguy c? b? b?m tm ho?c ch?y mu. Hy lin l?c v?ibc s? ho?c chuyn vin y t?, n?u qu v? th?y ch?y mu b?t th??ng. Hy c?n th?n khi ?nh r?ng ho?c x?a r?ng b?ng ch? nha khoa ho?c b?ng t?m, b?i v qu v? c th? d? b? nhi?m trng ho?c d? b? ch?y mu h?n. N?u qu v? c ?ilm r?ng, th  hy bo v?i nha s? r?ng qu v? ?ang dng thu?c ny. Trnh dng cc thu?c c ch?a aspirin, acetaminophen, ibuprofen, naproxen, ho?c ketoprofen, tr? khi ? ???c bc s? ch? d?n. Cc thu?c ny c th? che l?p tri?uch?ng s?t. Khng ???c ?? c thai trong khi dng thu?c ny. Ph? n? c?n ph?i thng bo cho bc s? c?a mnh, n?u mu?n c thai ho?c ngh? r?ng c th? mnh ? c [place]. C nguy c? v? cc tc d?ng ph? nghim tr?ng ??i v?i [place] nhi. Hy th?o lu?n v?i bc s? ho?c chuyn vin y t? ho?c d??c s? ?? bi?t thm thng tin. Khng ???cnui con b?ng s?a m? trong khi dng thu?c ny. Nam gi?i ???c khuy?n co khng nn gy th? [place] trong khi dng thu?c ny. Ch? ph?m ny c th? c ch?a r??u. Hy h?i d??c s? ho?c chuyn vin y t? c?a mnh xem thu?c ny c ch?a r??u hay khng. Hy ch?c ch?n bo cho t?t c? cc chuyn vin y t? c?a mnh bi?t r?ng qu v? ?ang dng thu?c ny. M?t s? thu?c, ch?ng h?n nh? metronidazole v disulfiram, c th? gy ra ph?n ?ng kh ch?u khi ???c u?ng cng v?i r??u. Ph?n ?ng ny bao g?m tnh tr?ng ph?ng m?t, ?au ??u, bu?n i, i, v m? hi, v kht n??c nhi?u h?n. Ph?n ?ng c th? ko di t? 30pht ??n vi gi? ??ng h?. Ti c th? nh?n th?y nh?ng tc d?ng ph? no khi dng thu?c ny? Nh?ng tc d?ng ph? qu v? c?n ph?i bo cho bc s? ho?c chuyn vin y t? cngs?m cng t?t: cc ph?n ?ng d? ?ng, ch?ng h?n nh? da b? m?n ??, ng?a, n?i my ?ay, s?ng ? m?t, mi, ho?c l??i kh th? thay ??i th? l?c tim ??p nhanh  ho?c khng ??u huy?t p cao ho?c th?p lot mi?ng ?au, t ho?c c?m gic nh? b? ki?n b ? bn tay ho?c bn chn cc d?u hi?u gi?m ti?u c?u ho?c xu?t huy?t - b?m tm, cc n?t l?m t?m ?? trn da, phn c mu ?en, mu h?c n, c mu trong n??c ti?u cc d?u hi?u gi?m s? l??ng t? bo h?ng c?u - c?m th?y y?u ?t ho?c m?t m?i m?t cch b?t th??ng, cc c?n ng?t x?u, chong vng cc d?u hi?u nhi?m trng - s?t ho?c ?n l?nh, ho, ?au h?ng, kh ?i ti?u ho?c ?i ti?u ?au cc d?u hi?u v tri?u ch?ng t?n th??ng gan, ch?ng  h?n nh? n??c ti?u mu nu ho?c vng s?m; c?m gic b? b?nh ki?u chung chung ho?c cc tri?u ch?ng gi?ng nh? cm; phn b?c mu; m?t c?m gic ngon mi?ng; bu?n i; ?au vng b?ng trn; y?u ?t ho?c m?t m?i khc th??ng; vng da ho?c m?t s?ng ? m?t c chn, bn chn, ho?c bn tay tim ??p ch?m m?t cch b?t th??ng Cc tc d?ng ph? khng c?n ph?i ch?m Severance y t? (hy bo cho bc s? ho?c chuynvin y t?, n?u cc tc d?ng ph? ny ti?p di?n ho?c gy phi?n toi): tiu ch?y r?ng tc m?t c?m gic ngon mi?ng ?au ? kh?p ho?c c? b?p bu?n i ho?c i m?a ?au, ??, ng?a, ho?c kch ?ng ? ch? tim c?m th?y m?t m?i Danh sch ny c th? khng m t? ?? h?t cc tc d?ng ph? c th? x?y ra. Xin g?i t?i bc s? c?a mnh ?? ???c c? v?n chuyn mn v? cc tc d?ng ph?Sander Nephew v? cth? t??ng trnh cc tc d?ng ph? cho FDA theo s? [phone]. Ti nn c?t gi? thu?c c?a mnh ? ?u? Thu?c ny ???c s? d?ng b?i chuyn vin y t? ? b?nh vi?n ho?c ? phng m?ch. Quv? s? khng ???c c?p thu?c ny ?? c?t gi? t?i nh. L?U : ?y l b?n tm t?t. N c th? khng bao hm t?t c? thng tin c th? c. N?u qu v? th?c m?c v? thu?c ny, xin trao ??i v?i bc s?, d??c s?, ho?c ng??icung c?p d?ch v? y t? c?a mnh.  2022 Elsevier/Gold Standard (2020-01-03 00:00:00)     Trastuzumab injection for infusion ?y l thu?c g? TRASTUZUMAB l m?t khng th? ??n dng. Thu?c ???c dng ?? ?i?u tr? ung th? vv ung th? bao t?. Thu?c ny c th? ???c dng cho nh?ng m?c ?ch khc; hy h?i ng??i cung c?p d?chv? y t? ho?c d??c s? c?a mnh, n?u qu v? c th?c m?c. (CC) NHN HI?U PH? BI?N: [place], [place], KANJINTI, Ogivri, Ontruzant,Trazimera Ti c?n ph?i bo cho ng??i cung c?p d?ch v? y t? c?a mnh ?i?u g tr??c khidng thu?c ny? H? c?n bi?t li?u qu v? c b?t k? tnh tr?ng no sau ?y khng: b?nh tim suy tim b?nh ph?i ho??c h h?p, ch??ng ha?n nh? hen suy?n pha?n ??ng b?t th???ng ho??c di? ??ng v??i trastuzumab ho?c c?n benzyl pha?n ??ng b?t th???ng ho??c  di? ??ng v??i ca?c d??c ph?m kha?c, th?c ph?m, thu?c nhu?m, ho??c ch?t ba?o qua?n ?ang c thai ho??c ??nh co? thai ?ang cho con bu? Ti nn s? d?ng thu?c ny nh? th? no? Thu?c ny ?? truy?n vo t?nh m?ch. Thu?c ny ???c cho trong b?nh vi?n ho?cphng m?ch b?i chuyn vin y t? ???c hu?n luy?n ??c bi?t. Hy bn v?i bc s? nhi khoa c?a qu v? v? vi?c dng thu?c ny ?  tr? em. Thu?cny khng ???c duy?t ?? dng cho tr? em. Qu li?u: N?u qu v? cho r?ng mnh ? dng qu nhi?u thu?c ny, th hy linl?c v?i trung tm ki?m sot ch?t ??c ho?c phng c?p c?u ngay l?p t?c. L?U : Thu?c ny ch? dnh ring cho qu v?. Khng chia s? thu?c ny v?i nh?ngng??i khc. N?u ti l? qun m?t li?u th sao? ?i?u quan tr?ng l khng nn b? l? li?u thu?c no. Hy lin l?c v?i bc s? ho?cchuyn vin y t? c?a mnh, n?u qu v? khng th? gi? ?ng cu?c h?n khm. Nh?ng g c th? t??ng tc v?i thu?c ny? Thu?c ny c th? t??ng tc v?i cc thu?c sau ?y: m?t s? lo?i thu?c ha tr? li?u, nh? l daunorubicin, doxorubicin, epirubicin v idarubicin Danh sch ny c th? khng m t? ?? h?t cc t??ng tc c th? x?y ra. Hy ??a cho ng??i cung c?p d?ch v? y t? c?a mnh danh sch t?t c? cc thu?c, th?o d??c, cc thu?c khng c?n toa, ho?c cc ch? ph?m b? sung m qu v? dng. C?ng nn bo cho h? bi?t r?ng qu v? c ht thu?c, u?ng r??u, ho?c c s? d?ng ma ty triphp hay khng. Vi th? c th? t??ng tc v?i thu?c c?a qu v?. Ti c?n ph?i theo di ?i?u g trong khi dng thu?c ny? Hy ?i g?p bc s? ho?c chuyn vin y t? ?? theo di ??nh k? s? c?i thi?n c?a qu v?. Hy t??ng trnh m?i tc d?ng ph?. Hy ti?p t?c ??t ?i?u tr? c?a mnhngay c? khi qu v? c?m th?y m?t, tr? khi bc s? yu c?u qu v? ng?ng ?i?u tr?. Hy h?i  ki?n bc s? ho?c chuyn vin y t?, n?u qu v? b? s?t, ?n l?nh ho?c ?au h?ng, ho?c c cc tri?u ch?ng khc c?a c?m l?nh ho?c cm. Khng ???c t??i?u tr? cho mnh. Hy c? trnh ? g?n nh?ng ng??i b? b?nh. Qu v? c th? b? s?t, c?m th?y  ?n l?nh v run trong l?n truy?n ??u tin. Nh?ng tc d?ng ph? ny th??ng nh? v c th? ???c ?i?u tr? b?ng cc thu?c khc. Hy t??ng trnh cho bc s? ho?c chuyn vin y t? m?i tc d?ng ph? trong khi truy?n.S?t v ?n l?nh th??ng khng x?y ra trong cc l?n truy?n sau. Khng ???c c thai trong th?i gian dng thu?c ny ho?c trong vng 7 thng sau khi ng?ng dng thu?c ny. Ph? n? c?n ph?i thng bo cho bc s? c?a mnh, n?u mu?n c thai ho?c ngh? r?ng c th? mnh ? c [place]. Phu? n?? co? kha? n?ng mang thai c?n pha?i co? xt nghi?m th? [place] m tnh tr??c khi b?t ??u dng thu?c na?y. C nguy c? v? cc tc d?ng ph? nghim tr?ng ??i v?i [place] nhi. Hy th?o lu?n v?i bc s? ho?c chuyn vin y t? ho?c d??c s? ?? bi?t thm thng tin. Khng ???c cho con b trong th?i gian dng thu?c ny ho?c trong vng 7 thngsau khi ng?ng dng thu?c. Ph? n? ph?i s? d?ng bi?n php ng?a thai hi?u qu? cng v?i thu?c ny. Ti c th? nh?n th?y nh?ng tc d?ng ph? no khi dng thu?c ny? Nh?ng tc d?ng ph? qu v? c?n ph?i bo cho bc s? ho?c chuyn vin y t? cngs?m cng t?t: cc ph?n ?ng d? ?ng, ch?ng h?n nh? da b? m?n ??, ng?a, n?i my ?ay, s?ng ? m?t, mi, ho?c l??i ?au ng?c ?nh tr?ng ng?c ho chng m?t c?m  th?y chong vng, ng?t x?u, b? t s?t c?m th?y m?t m?i ho?c cc tri?u ch?ng gi?ng cm cc d?u hi?u suy tim n?ng h?n, ch?ng h?n nh? cc v?n ?? v? h h?p; b? s?ng ? chn v bn chn c?a mnh m?t m?i ho?c y?u ?t b?t th??ng Cc tc d?ng ph? khng c?n ph?i ch?m  y t? (hy bo cho bc s? ho?c chuynvin y t?, n?u cc tc d?ng ph? ny ti?p di?n ho?c gy phi?n toi): ?au x??ng th?y v? c?a cc mn ?n thay ??i tiu ch?y ?au ho?c nh?c kh?p bu?n i ho?c i m?a gi?m cn Danh sch ny c th? khng m t? ?? h?t cc tc d?ng ph? c th? x?y ra. Xin g?i t?i bc s? c?a mnh ?? ???c c? v?n chuyn mn v? cc tc d?ng ph?Sander Nephew v? cth? t??ng trnh cc tc d?ng ph? cho FDA theo s? [phone]. Ti nn c?t gi? thu?c c?a mnh ? ?u? Thu?c ny  ???c s? d?ng b?i chuyn vin y t? ? b?nh vi?n ho?c ? phng m?ch. Quv? s? khng ???c c?p thu?c ny ?? c?t gi? t?i nh. L?U : ?y l b?n tm t?t. N c th? khng bao hm t?t c? thng tin c th? c. N?u qu v? th?c m?c v? thu?c ny, xin trao ??i v?i bc s?, d??c s?, ho?c ng??icung c?p d?ch v? y t? c?a mnh.  2022 Elsevier/Gold Standard (2020-03-04 00:00:00)    Aprepitant oral suspension ?y l thu?c g? APREPITANT ???c dng chung v?i cc thu?c khc ?? phng ng?a bu?n i v i m?ado ?i?u tr? ung th? (ha tr? li?u). Thu?c ny c th? ???c dng cho nh?ng m?c ?ch khc; hy h?i ng??i cung c?p d?chv? y t? ho?c d??c s? c?a mnh, n?u qu v? c th?c m?c. (CC) NHN HI?U PH? BI?N: Emend Ti c?n ph?i bo cho ng??i cung c?p d?ch v? y t? c?a mnh ?i?u g tr??c khidng thu?c ny? H? c?n bi?t li?u qu v? c b?t k? tnh tr?ng no sau ?y khng: b?nh gan pha?n ??ng b?t th???ng ho??c di? ??ng v??i aprepitant ho?c fosaprepitant pha?n ??ng b?t th???ng ho??c di? ??ng v??i ca?c d??c ph?m kha?c, th?c ph?m, thu?c nhu?m, ho??c ch?t ba?o qua?n ?ang c thai ho??c ??nh co? thai ?ang cho con bu? Ti nn s? d?ng thu?c ny nh? th? no? U?ng thu?c ny. Hy lm theo cc h??ng d?n trn h?p thu?c ho?c nhn thu?c. Thng th??ng, qu v? s? dng li?u th? nh?t tr??c khi b?t ??u ha tr? li?u 1 gi? ??ng h?, v sau ? m?i ngy m?t l?n vo bu?i sng trong 2 ngy ti?p theo, sau khi ?i?u tr? b?ng ha tr? li?u. Qu v? c th? u?ng thu?c ny cng ho?c khngcng v?i th?c ?n. Khng ???c dng thu?c ny nhi?u l?n h?n ? ???c ch? d?n. Hy bn v?i bc s? nhi khoa c?a qu v? v? vi?c dng thu?c ny ? tr? em. Thu?c ny c th? ???c k toa cho tr? nh? ch? m?i 6 thng tu?i trong nh?ng tr??ng h?pch?n l?c, nh?ng c?n ph?i th?n tr?ng. Qu li?u: N?u qu v? cho r?ng mnh ? dng qu nhi?u thu?c ny, th hy linl?c v?i trung tm ki?m sot ch?t ??c ho?c phng c?p c?u ngay l?p t?c. L?U : Thu?c ny ch? dnh ring cho qu v?. Khng chia s? thu?c ny v?i  nh?ngng??i khc. N?u ti l? qun m?t li?u th sao? N?u qu v? l? qun m?t li?u thu?c, hy dng li?u thu?c ? ngay  khi c th?. N?u h?u nh? ? ??n gi? dng li?u thu?c k? ti?p, th ch? dng li?u thu?c k? ti?p ?.Khng ???c dng li?u g?p ?i ho?c dng thm li?u. Nh?ng g c th? t??ng tc v?i thu?c ny? Khng ???c dng thu?c ny cng v?i b?t k? thu?c no sau ?y: cisapride flibanserin lomitapide pimozide Thu?c ny c?ng c th? t??ng tc v?i cc thu?c sau ?y: diltiazem cc n?i ti?t t? n?, ch?ng h?n nh? estrogens ho?c progestins, v vin thu?c ng?a [place] m?t s? thu?c dng ?? tr? cc b?nh nhi?m n?m, ch?ng h?n nh? itraconazole, ketoconazole cc thu?c dng ?? tr? HIV m?t s? thu?c dng cho cc ch?ng co gi?t ho?c ?? ki?m sot b?nh ??ng kinh, ch?ng h?n nh? carbamazepine ho?c phenytoin m?t s? thu?c dng cho ch?ng lo u ho?c cc v?n ?? v? gi?c ng?, ch?ng h?n nh? alprazolam, diazepam, ho?c midazolam nefazodone paroxetine ranolazine rifampicin m?t s? thu?c ha tr? li?u, ch?ng h?n nh? etoposide, ifosfamide, vinblastine, vincristine m?t s? thu?c khng sinh, ch?ng h?n nh? clarithromycin, erythromycin, troleandomycin cc thu?c steroid, ch?ng h?n nh? dexamethasone ho?c methylprednisolone tolbutamide warfarin Danh sch ny c th? khng m t? ?? h?t cc t??ng tc c th? x?y ra. Hy ??a cho ng??i cung c?p d?ch v? y t? c?a mnh danh sch t?t c? cc thu?c, th?o d??c, cc thu?c khng c?n toa, ho?c cc ch? ph?m b? sung m qu v? dng. C?ng nn bo cho h? bi?t r?ng qu v? c ht thu?c, u?ng r??u, ho?c c s? d?ng ma ty triphp hay khng. Vi th? c th? t??ng tc v?i thu?c c?a qu v?. Ti c?n ph?i theo di ?i?u g trong khi dng thu?c ny? Khng ???c dng thu?c ny n?u qu v? ? b? bu?n i v i m?a. Hy h?i bc s?ho?c chuyn vin y t? ?? xem qu v? c?n ph?i lm g n?u ? b? bu?n i. Thu?c ng?a [place] v cc bi?n php ng?a thai b?ng n?i ti?t t? khc (v d? nh? d?ng c? ??t trong t? cung ho?c mi?ng thu?c dn) c th? khng  c tc d?ng thch ?ng trong khi qu v? ?ang u?ng thu?c ny. Hy s? d?ng bi?n php ng?a [place] b? sung trong th?i gian ?i?u tr? v trong 1 thng sau li?u aprepitant cu?i cngc?a qu v?. Khng ???c dng thu?c ny lin t?c trong m?t th?i gian di. Hy ??n g?p bc s? ho?c [place] vin y t? ?? theo di ??nh k? s?c kh?e c?a mnh. Thu?c ny c th? lm thay ??i k?t qu? xt nghi?m mu v? ch?c n?ng gan c?a quv?Marland Kitchen Ti c th? nh?n th?y nh?ng tc d?ng ph? no khi dng thu?c ny? Nh?ng tc d?ng ph? qu v? c?n ph?i bo cho bc s? ho?c chuyn vin y t? cngs?m cng t?t: cc ph?n ?ng d? ?ng, ch?ng h?n nh? da b? m?n ??, ng?a, n?i my ?ay, s?ng ? m?t, mi, ho?c l??i kh th? thay ??i nh?p tim huy?t p cao ho?c th?p xu?t huy?t ru?t th?ng (tr?c trng) chng m?t d? d?i ho?c m?t ph??ng h??ng, l l?n ?au b?ng d? d?i ho?c ?au nhi trong b?ng ?au nhi ? chn Cc tc d?ng ph? khng c?n ph?i ch?m [place] y t? (hy bo cho bc s? ho?c chuynvin y t?, n?u cc tc d?ng ph? ny ti?p di?n ho?c gy phi?n toi): to bn ho?c tiu ch?y ho r?ng tc ?au ??u n?c c?t m?t c?m gic ngon mi?ng bu?n i kh ch?u ? bao t? c?m th?y m?t m?i Danh sch  ny c th? khng m t? ?? h?t cc tc d?ng ph? c th? x?y ra. Xin g?i t?i bc s? c?a mnh ?? ???c c? v?n chuyn mn v? cc tc d?ng ph?Sander Nephew v? cth? t??ng trnh cc tc d?ng ph? cho FDA theo s? [phone]. Ti nn c?t gi? thu?c c?a mnh ? ?u? ?? ngoi t?m tay tr? em. Sau khi ???c d??c s? pha ch?, hy c?t gi? thu?c ? nhi?t ?? phng t? 2 ??n 8 ?? C (38 ??n 48 ?? F). Khng ???c ?? ?ng l?nh. Lo?i b? b?t k? l??ng thu?c no ch?a dng sau 74 gi? ??ng h?Kathlynn Grate ? s?n sng dng, thu?c ny c th? ???c l?ugi? ? nhi?t ?? phng t? 20 ??n 25 ?? C (70 ??n 79 ?? F) cho t?i 3 gi? ??ng h?. L?U : ?y l b?n tm t?t. N c th? khng bao hm t?t c? thng tin c th? c. N?u qu v? th?c m?c v? thu?c ny, xin trao ??i v?i bc s?, d??c s?, ho?c ng??icung c?p d?ch v? y t? c?a mnh.  2022 [employer]vier/Gold Standard  (2018-08-07 00:00:00)

## 2021-05-11 ENCOUNTER — Telehealth: Payer: Self-pay

## 2021-05-11 NOTE — Telephone Encounter (Signed)
Telephone call to patient for follow up after receiving first infusion.   Patient sister states infusion went great.  States eating good and drinking plenty of fluids.   Denies any nausea or vomiting.  Encouraged patient to call for any concerns or questions.

## 2021-05-15 ENCOUNTER — Encounter: Payer: Self-pay | Admitting: Oncology

## 2021-05-15 ENCOUNTER — Inpatient Hospital Stay: Payer: Medicare Other

## 2021-05-15 ENCOUNTER — Inpatient Hospital Stay (HOSPITAL_BASED_OUTPATIENT_CLINIC_OR_DEPARTMENT_OTHER): Payer: Medicare Other | Admitting: Oncology

## 2021-05-15 ENCOUNTER — Inpatient Hospital Stay: Payer: Medicare Other | Attending: Oncology

## 2021-05-15 VITALS — BP 125/72 | HR 97 | Temp 99.1°F | Resp 16 | Wt 116.0 lb

## 2021-05-15 DIAGNOSIS — C50412 Malignant neoplasm of upper-outer quadrant of left female breast: Secondary | ICD-10-CM | POA: Insufficient documentation

## 2021-05-15 DIAGNOSIS — C50919 Malignant neoplasm of unspecified site of unspecified female breast: Secondary | ICD-10-CM

## 2021-05-15 DIAGNOSIS — Z5112 Encounter for antineoplastic immunotherapy: Secondary | ICD-10-CM | POA: Diagnosis not present

## 2021-05-15 DIAGNOSIS — Z5111 Encounter for antineoplastic chemotherapy: Secondary | ICD-10-CM | POA: Insufficient documentation

## 2021-05-15 DIAGNOSIS — Z170 Estrogen receptor positive status [ER+]: Secondary | ICD-10-CM | POA: Insufficient documentation

## 2021-05-15 DIAGNOSIS — E876 Hypokalemia: Secondary | ICD-10-CM | POA: Diagnosis not present

## 2021-05-15 LAB — CBC WITH DIFFERENTIAL/PLATELET
Abs Immature Granulocytes: 0.04 10*3/uL (ref 0.00–0.07)
Basophils Absolute: 0.1 10*3/uL (ref 0.0–0.1)
Basophils Relative: 1 %
Eosinophils Absolute: 0.4 10*3/uL (ref 0.0–0.5)
Eosinophils Relative: 6 %
HCT: 39.4 % (ref 36.0–46.0)
Hemoglobin: 13.1 g/dL (ref 12.0–15.0)
Immature Granulocytes: 1 %
Lymphocytes Relative: 29 %
Lymphs Abs: 1.7 10*3/uL (ref 0.7–4.0)
MCH: 31.4 pg (ref 26.0–34.0)
MCHC: 33.2 g/dL (ref 30.0–36.0)
MCV: 94.5 fL (ref 80.0–100.0)
Monocytes Absolute: 0.4 10*3/uL (ref 0.1–1.0)
Monocytes Relative: 7 %
Neutro Abs: 3.3 10*3/uL (ref 1.7–7.7)
Neutrophils Relative %: 56 %
Platelets: 297 10*3/uL (ref 150–400)
RBC: 4.17 MIL/uL (ref 3.87–5.11)
RDW: 12 % (ref 11.5–15.5)
WBC: 5.8 10*3/uL (ref 4.0–10.5)
nRBC: 0 % (ref 0.0–0.2)

## 2021-05-15 LAB — COMPREHENSIVE METABOLIC PANEL
ALT: 25 U/L (ref 0–44)
AST: 24 U/L (ref 15–41)
Albumin: 4.3 g/dL (ref 3.5–5.0)
Alkaline Phosphatase: 49 U/L (ref 38–126)
Anion gap: 7 (ref 5–15)
BUN: 11 mg/dL (ref 6–20)
CO2: 25 mmol/L (ref 22–32)
Calcium: 9.3 mg/dL (ref 8.9–10.3)
Chloride: 103 mmol/L (ref 98–111)
Creatinine, Ser: 0.64 mg/dL (ref 0.44–1.00)
GFR, Estimated: >60 mL/min (ref >60)
Glucose, Bld: 112 mg/dL — ABNORMAL HIGH (ref 70–99)
Potassium: 3.7 mmol/L (ref 3.5–5.1)
Sodium: 135 mmol/L (ref 135–145)
Total Bilirubin: 0.4 mg/dL (ref 0.3–1.2)
Total Protein: 7.4 g/dL (ref 6.5–8.1)

## 2021-05-15 MED ORDER — TRASTUZUMAB-DKST CHEMO 150 MG IV SOLR
2.0000 mg/kg | Freq: Once | INTRAVENOUS | Status: AC
  Administered 2021-05-15: 105 mg via INTRAVENOUS
  Filled 2021-05-15: qty 5

## 2021-05-15 MED ORDER — DIPHENHYDRAMINE HCL 50 MG/ML IJ SOLN
50.0000 mg | Freq: Once | INTRAMUSCULAR | Status: AC
  Administered 2021-05-15: 50 mg via INTRAVENOUS
  Filled 2021-05-15: qty 1

## 2021-05-15 MED ORDER — ACETAMINOPHEN 325 MG PO TABS
650.0000 mg | ORAL_TABLET | Freq: Once | ORAL | Status: AC
  Administered 2021-05-15: 650 mg via ORAL
  Filled 2021-05-15: qty 2

## 2021-05-15 MED ORDER — SODIUM CHLORIDE 0.9 % IV SOLN
80.0000 mg/m2 | Freq: Once | INTRAVENOUS | Status: AC
  Administered 2021-05-15: 120 mg via INTRAVENOUS
  Filled 2021-05-15: qty 20

## 2021-05-15 MED ORDER — SODIUM CHLORIDE 0.9 % IV SOLN
10.0000 mg | Freq: Once | INTRAVENOUS | Status: AC
  Administered 2021-05-15: 10 mg via INTRAVENOUS
  Filled 2021-05-15: qty 10

## 2021-05-15 MED ORDER — FAMOTIDINE 20 MG IN NS 100 ML IVPB
20.0000 mg | Freq: Once | INTRAVENOUS | Status: AC
  Administered 2021-05-15: 20 mg via INTRAVENOUS
  Filled 2021-05-15: qty 20

## 2021-05-15 MED ORDER — SODIUM CHLORIDE 0.9 % IV SOLN
Freq: Once | INTRAVENOUS | Status: AC
  Filled 2021-05-15: qty 250

## 2021-05-15 NOTE — Patient Instructions (Signed)
[facility]  Discharge Instructions: Thank you for choosing [facility] to provide your oncology and hematology care.  If you have a lab appointment with the [facility], please go directly to the [facility] and check in at the registration area.  Wear comfortable clothing and clothing appropriate for easy access to any Portacath or PICC line.   We strive to give you quality time with your provider. You may need to reschedule your appointment if you arrive late (15 or more minutes).  Arriving late affects you and other patients whose appointments are after yours.  Also, if you miss three or more appointments without notifying the office, you may be dismissed from the clinic at the provider's discretion.      For prescription refill requests, have your pharmacy contact our office and allow 72 hours for refills to be completed.    Today you received the following chemotherapy and/or immunotherapy agents Ogivir & Taxol    To help prevent nausea and vomiting after your treatment, we encourage you to take your nausea medication as directed.  BELOW ARE SYMPTOMS THAT SHOULD BE REPORTED IMMEDIATELY: *FEVER GREATER THAN 100.4 F (38 C) OR HIGHER *CHILLS OR SWEATING *NAUSEA AND VOMITING THAT IS NOT CONTROLLED WITH YOUR NAUSEA MEDICATION *UNUSUAL SHORTNESS OF BREATH *UNUSUAL BRUISING OR BLEEDING *URINARY PROBLEMS (pain or burning when urinating, or frequent urination) *BOWEL PROBLEMS (unusual diarrhea, constipation, pain near the anus) TENDERNESS IN MOUTH AND THROAT WITH OR WITHOUT PRESENCE OF ULCERS (sore throat, sores in mouth, or a toothache) UNUSUAL RASH, SWELLING OR PAIN  UNUSUAL VAGINAL DISCHARGE OR ITCHING   Items with * indicate a potential emergency and should be followed up as soon as possible or go to the Emergency Department if any problems should occur.  Please show the CHEMOTHERAPY ALERT CARD or IMMUNOTHERAPY ALERT CARD at  check-in to the Emergency Department and triage nurse.  Should you have questions after your visit or need to cancel or reschedule your appointment, please contact [facility]  [phone] and follow the prompts.  Office hours are 8:00 a.m. to 4:30 p.m. Monday - Friday. Please note that voicemails left after 4:00 p.m. may not be returned until the following business day.  We are closed weekends and major holidays. You have access to a nurse at all times for urgent questions. Please call the main number to the clinic [phone] and follow the prompts.  For any non-urgent questions, you may also contact your provider using MyChart. We now offer e-Visits for anyone 88 and older to request care online for non-urgent symptoms. For details visit [email or web address].   Also download the MyChart app! Go to the app store, search "MyChart", open the app, select [facility], and log in with your MyChart username and password.  Due to Covid, a mask is required upon entering the hospital/clinic. If you do not have a mask, one will be given to you upon arrival. For doctor visits, patients may have 1 support person aged 39 or older with them. For treatment visits, patients cannot have anyone with them due to current Covid guidelines and our immunocompromised population.

## 2021-05-15 NOTE — Progress Notes (Signed)
Hematology/Oncology follow up  note [facility] [phone] [phone]   Patient Care Team: Kirk Ruths, MD as PCP - General (Internal Medicine)  REFERRING PROVIDER: Kirk Ruths, MD  CHIEF COMPLAINTS/REASON FOR VISIT:  Follow up for treatment of breast cancer  HISTORY OF PRESENTING ILLNESS:   Anna Banks is a  45 y.o.  female with PMH listed below was seen in consultation at the request of  Kirk Ruths, MD  for evaluation of breast cancer.   Patient was accompanied by her half sister today. Per sister, patient has history of seizure since childhood and has intellectual/cognitive impairment. She is illiterate. She follows instructions from her family members.  She lives with her mother and her half sister who takes of her and helps her to make medical decision.   They waived interpretor. Both patient and her half sister speak [place].  Menarche 65 years of age. G0P0. No child, no previous pregnancy. Per her sister, patient is not sexually active No prior use of birth control pills or hormone replacement therapy. Denies previous history of biopsy.  02/16/2021, screening mammogram showed calcification in the right breast needs further evaluation.  Left breast possible mass with distortion requires further evaluation. 02/26/2021, left breast showed a 10 mm indeterminate mass, 2:00, 8 cm from nipple.  No mammographic evidence of malignancy in the right breast.  No left axillary lymphadenopathy. 03/10/2021, patient underwent ultrasound-guided biopsy of left breast mass.  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  ER low positive [1-10%], PR positive [1-10%], HER2 positive by IHC 3+  # 04/09/2021, status postlumpectomy and sentinel lymph node biopsy. Invasive mammary carcinoma, no special type, 4 lymph nodes all negative for metastatic carcinoma.   DCIS present.  LVI not identified.Grade 2, all margins negative for invasive carcinoma. pT1c pN0, ER[staining was repeated on surgical specimen] [11-50%]+ PR [1-10%]+, HER 2+  Baseline echocardiogram showed normal LVEF.  INTERVAL HISTORY Anna Banks is a 45 y.o. female who has above history reviewed by me today presents for follow up visit for chemotherapy for HER2 positive breast cancer  Patient is here by herself today.  [place] interpreter online service was used. Patient reports feeling well today.  No nausea vomiting diarrhea.  Denies any neuropathy.  Review of Systems  Unable to perform ROS: Other (intellectual/Cognitive impairment)  Constitutional:  Negative for appetite change, chills, fatigue and fever.  HENT:   Negative for hearing loss and voice change.   Eyes:  Negative for eye problems.  Respiratory:  Negative for chest tightness and cough.   Cardiovascular:  Negative for chest pain.  Gastrointestinal:  Negative for abdominal distention, abdominal pain and blood in stool.  Endocrine: Negative for hot flashes.  Genitourinary:  Negative for difficulty urinating and frequency.   Musculoskeletal:  Negative for arthralgias.  Skin:  Negative for itching and rash.  Neurological:  Negative for extremity weakness.  Hematological:  Negative for adenopathy.  Psychiatric/Behavioral:  Negative for confusion.    MEDICAL HISTORY:  Past Medical History:  Diagnosis Date   Down syndrome    Seizure [facility])     SURGICAL HISTORY: Past Surgical History:  Procedure Laterality Date   BREAST BIOPSY Left 03/10/2021   [place] BX,ribbon clip, [place]   BREAST LUMPECTOMY,RADIO FREQ LOCALIZER,AXILLARY SENTINEL LYMPH NODE BIOPSY Left 04/09/2021   Procedure: BREAST LUMPECTOMY,RADIO FREQ LOCALIZER,AXILLARY SENTINEL LYMPH NODE BIOPSY;  Surgeon: Benjamine Sprague, DO;  Location: [facility] ORS;  Service: General;  Laterality: Left;    SOCIAL HISTORY: Social History   Socioeconomic History   Marital status:  Single    Spouse name: Not on file   Number of children: Not on file   Years of education: Not on file   Highest education level: Not on file  Occupational History   Not on file  Tobacco Use   Smoking status: Never   Smokeless tobacco: Never  Vaping Use   Vaping Use: Never used  Substance and Sexual Activity   Alcohol use: Never   Drug use: Not Currently   Sexual activity: Not on file  Other Topics Concern   Not on file  Social History Narrative   Not on file   Social Determinants of Health   Financial Resource Strain: Not on file  Food Insecurity: Not on file  Transportation Needs: Not on file  Physical Activity: Not on file  Stress: Not on file  Social Connections: Not on file  Intimate Partner Violence: Not on file    FAMILY HISTORY: History reviewed. No pertinent family history.  ALLERGIES:  has No Known Allergies.  MEDICATIONS:  Current Outpatient Medications  Medication Sig Dispense Refill   acetaminophen (TYLENOL) 325 MG tablet Take 325-650 mg by mouth every 6 (six) hours as needed for moderate pain.     ethosuximide (ZARONTIN) 250 MG capsule Take 250 mg by mouth 2 (two) times daily.     ibuprofen (ADVIL) 800 MG tablet Take 1 tablet (800 mg total) by mouth every 8 (eight) hours as needed for mild pain or moderate pain. 30 tablet 0   VITAMIN D PO Take 1 capsule by mouth daily.     HYDROcodone-acetaminophen (NORCO) 5-325 MG tablet Take 1 tablet by mouth every 6 (six) hours as needed for up to 6 doses for moderate pain. (Patient not taking: Reported on 05/15/2021) 6 tablet 0   ondansetron (ZOFRAN) 8 MG tablet Take 1 tablet (8 mg total) by mouth 2 (two) times daily as needed (Nausea or vomiting). (Patient not taking: Reported on 05/15/2021) 30 tablet 1   No current facility-administered medications for this visit.     PHYSICAL EXAMINATION: ECOG PERFORMANCE STATUS: 0 - Asymptomatic Vitals:   05/15/21 0939  BP: 125/72  Pulse: 97  Resp: 16  Temp: 99.1 F (37.3  C)  SpO2: 100%   Filed Weights   05/15/21 0939  Weight: 116 lb (52.6 kg)    Physical Exam Constitutional:      General: She is not in acute distress. HENT:     Head: Normocephalic and atraumatic.  Eyes:     General: No scleral icterus. Cardiovascular:     Rate and Rhythm: Normal rate and regular rhythm.     Heart sounds: Normal heart sounds.  Pulmonary:     Effort: Pulmonary effort is normal. No respiratory distress.     Breath sounds: No wheezing.  Abdominal:     General: Bowel sounds are normal. There is no distension.     Palpations: Abdomen is soft.  Musculoskeletal:        General: No deformity. Normal range of motion.     Cervical back: Normal range of motion and neck supple.  Skin:    General: Skin is warm and dry.     Findings: No erythema or rash.  Neurological:     Mental Status: She is alert. Mental status is at baseline.     Cranial Nerves: No cranial nerve deficit.     Coordination: Coordination  normal.  Psychiatric:        Mood and Affect: Mood normal.   .   LABORATORY DATA:  I have reviewed the data as listed Lab Results  Component Value Date   WBC 5.8 05/15/2021   HGB 13.1 05/15/2021   HCT 39.4 05/15/2021   MCV 94.5 05/15/2021   PLT 297 05/15/2021   Recent Labs    03/23/21 1006 05/08/21 0809 05/15/21 0854  NA 137 136 135  K 4.0 3.6 3.7  CL 105 105 103  CO2 _0 GLUCOSE 99 156* 112*  BUN _1 CREATININE 0.58 0.63 0.64  CALCIUM 9.4 9.3 9.3  GFRNONAA >60 >60 >60  PROT 8.2* 7.6 7.4  ALBUMIN 4.4 4.3 4.3  AST _2 ALT _3 ALKPHOS 48 54 49  BILITOT 0.5 0.2* 0.4    Iron/TIBC/Ferritin/ %Sat No results found for: IRON, TIBC, FERRITIN, IRONPCTSAT    RADIOGRAPHIC STUDIES: I have personally reviewed the radiological images as listed and agreed with the findings in the report. ECHOCARDIOGRAM COMPLETE  Result Date: 05/06/2021    ECHOCARDIOGRAM REPORT   Patient Name:   [facility] R[facility]epka Date of Exam: 05/06/2021 Medical Rec  #:  [id number]   Height:       61.0 in Accession #:    [id number]  Weight:       115.0 lb Date of Birth:  1975-12-16   BSA:          1.493 m Patient Age:    49 years    BP:           127/84 mmHg Patient Gender: F           HR:           98 bpm. Exam Location:  [facility] Procedure: 2D Echo, Color Doppler, Cardiac Doppler and Strain Analysis Indications:     Z09 Chemotherapy  History:         Patient has no prior history of Echocardiogram examinations.                  Down's syndrome                  Invasive carcinoma of breast                  Encounter for monitoring cardiotoxic drug therapy.  Sonographer:     Charmayne Sheer Referring Phys:  [id number] Kenni Newton Diagnosing Phys: Kate Sable MD  Sonographer Comments: Global longitudinal strain was attempted. IMPRESSIONS  1. Left ventricular ejection fraction, by estimation, is 65 to 70%. The left ventricle has normal function. The left ventricle has no regional wall motion abnormalities. Left ventricular diastolic parameters were normal.  2. Right ventricular systolic function is normal. The right ventricular size is normal.  3. The mitral valve is normal in structure. No evidence of mitral valve regurgitation.  4. The aortic valve is grossly normal. Aortic valve regurgitation is not visualized.  5. The inferior vena cava is normal in size with greater than 50% respiratory variability, suggesting right atrial pressure of 3 mmHg. FINDINGS  Left Ventricle: Left ventricular ejection fraction, by estimation, is 65 to 70%. The left ventricle has normal function. The left ventricle has no regional wall motion abnormalities. Global longitudinal strain performed but not reported based on interpreter judgement due to suboptimal tracking. The left ventricular internal cavity size was normal in size. There is no left ventricular hypertrophy. Left ventricular diastolic  parameters were normal. Right Ventricle: The right ventricular size is normal. No increase in right ventricular wall  thickness. Right ventricular systolic function is normal. Left Atrium: Left atrial size was normal in size. Right Atrium: Right atrial size was normal in size. Pericardium: There is no evidence of pericardial effusion. Mitral Valve: The mitral valve is normal in structure. No evidence of mitral valve regurgitation. MV peak gradient, 3.2 mmHg. The mean mitral valve gradient is 2.0 mmHg. Tricuspid Valve: The tricuspid valve is normal in structure. Tricuspid valve regurgitation is not demonstrated. Aortic Valve: The aortic valve is grossly normal. Aortic valve regurgitation is not visualized. Aortic valve mean gradient measures 5.0 mmHg. Aortic valve peak gradient measures 8.5 mmHg. Aortic valve area, by VTI measures 1.69 cm. Pulmonic Valve: The pulmonic valve was not well visualized. Pulmonic valve regurgitation is not visualized. Aorta: The aortic root is normal in size and structure. Venous: The inferior vena cava is normal in size with greater than 50% respiratory variability, suggesting right atrial pressure of 3 mmHg. IAS/Shunts: No atrial level shunt detected by color flow Doppler.  LEFT VENTRICLE PLAX 2D LVIDd:         3.97 cm  Diastology LVIDs:         2.43 cm  LV e' medial:    8.70 cm/s LV PW:         0.63 cm  LV E/e' medial:  10.5 LV IVS:        0.70 cm  LV e' lateral:   12.60 cm/s LVOT diam:     1.70 cm  LV E/e' lateral: 7.2 LV SV:         44 LV SV Index:   30 LVOT Area:     2.27 cm  RIGHT VENTRICLE RV Basal diam:  2.29 cm RV S prime:     14.30 cm/s LEFT ATRIUM           Index       RIGHT ATRIUM          Index LA diam:      2.40 cm 1.61 cm/m  RA Area:     7.06 cm LA Vol (A4C): 15.1 ml 10.11 ml/m RA Volume:   12.40 ml 8.31 ml/m  AORTIC VALVE                    PULMONIC VALVE AV Area (Vmax):    1.65 cm     PV Vmax:       0.96 m/s AV Area (Vmean):   1.56 cm     PV Vmean:      73.600 cm/s AV Area (VTI):     1.69 cm     PV VTI:        0.191 m AV Vmax:           146.00 cm/s  PV Peak grad:  3.7 mmHg AV  Vmean:          101.000 cm/s PV Mean grad:  2.0 mmHg AV VTI:            0.264 m AV Peak Grad:      8.5 mmHg AV Mean Grad:      5.0 mmHg LVOT Vmax:         106.00 cm/s LVOT Vmean:        69.400 cm/s LVOT VTI:          0.196 m LVOT/AV VTI ratio: 0.74  AORTA Ao Root diam: 2.90 cm MITRAL VALVE MV  Area (PHT): 3.70 cm    SHUNTS MV Area VTI:   2.00 cm    Systemic VTI:  0.20 m MV Peak grad:  3.2 mmHg    Systemic Diam: 1.70 cm MV Mean grad:  2.0 mmHg MV Vmax:       0.89 m/s MV Vmean:      65.5 cm/s MV Decel Time: 205 msec MV E velocity: 91.30 cm/s MV A velocity: 83.60 cm/s MV E/A ratio:  1.09 Kate Sable MD Electronically signed by Kate Sable MD Signature Date/Time: 05/06/2021/7:01:02 PM    Final       ASSESSMENT & PLAN:  1. Invasive carcinoma of breast ([place])   2. Encounter for antineoplastic chemotherapy   Cancer Staging Invasive carcinoma of breast ([place]) Staging form: Breast, AJCC 8th Edition - Pathologic stage from 04/22/2021: Stage IA (pT1c, pN0, cM0, G2, ER+, PR+, HER2+) - Signed by Earlie Server, MD on 04/22/2021   Cancer Staging Invasive carcinoma of breast [facility]) Staging form: Breast, AJCC 8th Edition - Pathologic stage from 04/22/2021: Stage IA (pT1c, pN0, cM0, G2, ER+, PR+, HER2+) - Signed by Earlie Server, MD on 04/22/2021  #Left invasive carcinoma of breast,pT1c pN0 cM0 HER2 positive ER 11-50% positive,  PR 1-10% positive.   Labs reviewed and discussed with patient Counts are stable Proceed with cycle 2 of Taxol and trastuzumab  Patient is not sexually active.  Patient has cognitive impairment.  I will check hCG urine every 2 weeks prior to the chemo. Supportive care measures are necessary for patient well-being and will be provided as necessary. We spent sufficient time to discuss many aspect of care, questions were answered to patient's satisfaction.     All questions were answered. The patient knows to call the clinic with any problems questions or concerns.  cc Kirk Ruths, MD   Follow-up in 1 week for lab MD Taxol and trastuzumab treatments. Earlie Server, MD, PhD. Hematology [place] at [facility]  05/15/2021

## 2021-05-15 NOTE — Progress Notes (Signed)
Pt in for follow up, Denies any concerns today. RN used interpreter services via computer device.

## 2021-05-22 ENCOUNTER — Inpatient Hospital Stay: Payer: Medicare Other

## 2021-05-22 ENCOUNTER — Inpatient Hospital Stay (HOSPITAL_BASED_OUTPATIENT_CLINIC_OR_DEPARTMENT_OTHER): Payer: Medicare Other | Admitting: Oncology

## 2021-05-22 ENCOUNTER — Encounter: Payer: Self-pay | Admitting: Oncology

## 2021-05-22 VITALS — BP 121/83 | HR 85 | Temp 97.8°F | Resp 18 | Wt 122.0 lb

## 2021-05-22 DIAGNOSIS — Z5111 Encounter for antineoplastic chemotherapy: Secondary | ICD-10-CM

## 2021-05-22 DIAGNOSIS — C50919 Malignant neoplasm of unspecified site of unspecified female breast: Secondary | ICD-10-CM

## 2021-05-22 DIAGNOSIS — Z5112 Encounter for antineoplastic immunotherapy: Secondary | ICD-10-CM | POA: Diagnosis not present

## 2021-05-22 LAB — CBC WITH DIFFERENTIAL/PLATELET
Abs Immature Granulocytes: 0.03 10*3/uL (ref 0.00–0.07)
Basophils Absolute: 0 10*3/uL (ref 0.0–0.1)
Basophils Relative: 1 %
Eosinophils Absolute: 0.2 10*3/uL (ref 0.0–0.5)
Eosinophils Relative: 6 %
HCT: 36.1 % (ref 36.0–46.0)
Hemoglobin: 12 g/dL (ref 12.0–15.0)
Immature Granulocytes: 1 %
Lymphocytes Relative: 37 %
Lymphs Abs: 1.5 10*3/uL (ref 0.7–4.0)
MCH: 32.1 pg (ref 26.0–34.0)
MCHC: 33.2 g/dL (ref 30.0–36.0)
MCV: 96.5 fL (ref 80.0–100.0)
Monocytes Absolute: 0.3 10*3/uL (ref 0.1–1.0)
Monocytes Relative: 8 %
Neutro Abs: 1.9 10*3/uL (ref 1.7–7.7)
Neutrophils Relative %: 47 %
Platelets: 333 10*3/uL (ref 150–400)
RBC: 3.74 MIL/uL — ABNORMAL LOW (ref 3.87–5.11)
RDW: 12.3 % (ref 11.5–15.5)
WBC: 4 10*3/uL (ref 4.0–10.5)
nRBC: 0 % (ref 0.0–0.2)

## 2021-05-22 LAB — COMPREHENSIVE METABOLIC PANEL
ALT: 27 U/L (ref 0–44)
AST: 26 U/L (ref 15–41)
Albumin: 3.9 g/dL (ref 3.5–5.0)
Alkaline Phosphatase: 47 U/L (ref 38–126)
Anion gap: 7 (ref 5–15)
BUN: 10 mg/dL (ref 6–20)
CO2: 24 mmol/L (ref 22–32)
Calcium: 9.2 mg/dL (ref 8.9–10.3)
Chloride: 105 mmol/L (ref 98–111)
Creatinine, Ser: 0.56 mg/dL (ref 0.44–1.00)
GFR, Estimated: >60 mL/min (ref >60)
Glucose, Bld: 142 mg/dL — ABNORMAL HIGH (ref 70–99)
Potassium: 3.7 mmol/L (ref 3.5–5.1)
Sodium: 136 mmol/L (ref 135–145)
Total Bilirubin: 0.5 mg/dL (ref 0.3–1.2)
Total Protein: 6.9 g/dL (ref 6.5–8.1)

## 2021-05-22 LAB — PREGNANCY, URINE: Preg Test, Ur: NEGATIVE

## 2021-05-22 MED ORDER — FAMOTIDINE 20 MG IN NS 100 ML IVPB
20.0000 mg | Freq: Once | INTRAVENOUS | Status: AC
  Administered 2021-05-22: 20 mg via INTRAVENOUS
  Filled 2021-05-22: qty 20

## 2021-05-22 MED ORDER — SODIUM CHLORIDE 0.9 % IV SOLN
Freq: Once | INTRAVENOUS | Status: AC
  Filled 2021-05-22: qty 250

## 2021-05-22 MED ORDER — ACETAMINOPHEN 325 MG PO TABS
650.0000 mg | ORAL_TABLET | Freq: Once | ORAL | Status: AC
  Administered 2021-05-22: 650 mg via ORAL
  Filled 2021-05-22: qty 2

## 2021-05-22 MED ORDER — SODIUM CHLORIDE 0.9 % IV SOLN
80.0000 mg/m2 | Freq: Once | INTRAVENOUS | Status: AC
  Administered 2021-05-22: 120 mg via INTRAVENOUS
  Filled 2021-05-22: qty 20

## 2021-05-22 MED ORDER — SODIUM CHLORIDE 0.9 % IV SOLN
10.0000 mg | Freq: Once | INTRAVENOUS | Status: AC
  Administered 2021-05-22: 10 mg via INTRAVENOUS
  Filled 2021-05-22: qty 10

## 2021-05-22 MED ORDER — TRASTUZUMAB-DKST CHEMO 150 MG IV SOLR
2.0000 mg/kg | Freq: Once | INTRAVENOUS | Status: AC
  Administered 2021-05-22: 105 mg via INTRAVENOUS
  Filled 2021-05-22: qty 5

## 2021-05-22 MED ORDER — DIPHENHYDRAMINE HCL 50 MG/ML IJ SOLN
50.0000 mg | Freq: Once | INTRAMUSCULAR | Status: AC
  Administered 2021-05-22: 50 mg via INTRAVENOUS
  Filled 2021-05-22: qty 1

## 2021-05-22 NOTE — Progress Notes (Signed)
Hematology/Oncology follow up  note [facility] [phone] [phone]   Patient Care Team: Kirk Ruths, MD as PCP - General (Internal Medicine)  REFERRING PROVIDER: Kirk Ruths, MD  CHIEF COMPLAINTS/REASON FOR VISIT:  Follow up for treatment of breast cancer  HISTORY OF PRESENTING ILLNESS:   Anna Banks is a  45 y.o.  female with PMH listed below was seen in consultation at the request of  Kirk Ruths, MD  for evaluation of breast cancer.   Patient was accompanied by her half sister today. Per sister, patient has history of seizure since childhood and has intellectual/cognitive impairment. She is illiterate. She follows instructions from her family members.  She lives with her mother and her half sister who takes of her and helps her to make medical decision.   They waived interpretor. Both patient and her half sister speak [place].  Menarche 47 years of age. G0P0. No child, no previous pregnancy. Per her sister, patient is not sexually active No prior use of birth control pills or hormone replacement therapy. Denies previous history of biopsy.  02/16/2021, screening mammogram showed calcification in the right breast needs further evaluation.  Left breast possible mass with distortion requires further evaluation. 02/26/2021, left breast showed a 10 mm indeterminate mass, 2:00, 8 cm from nipple.  No mammographic evidence of malignancy in the right breast.  No left axillary lymphadenopathy. 03/10/2021, patient underwent ultrasound-guided biopsy of left breast mass.  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  ER low positive [1-10%], PR positive [1-10%], HER2 positive by IHC 3+  # 04/09/2021, status postlumpectomy and sentinel lymph node biopsy. Invasive mammary carcinoma, no special type, 4 lymph nodes all negative for metastatic carcinoma.   DCIS present.  LVI not identified.Grade 2, all margins negative for invasive carcinoma. pT1c pN0, ER[staining was repeated on surgical specimen] [11-50%]+ PR [1-10%]+, HER 2+  Baseline echocardiogram showed normal LVEF.  INTERVAL HISTORY Anna Banks is a 45 y.o. female who has above history reviewed by me today presents for follow up visit for chemotherapy for HER2 positive breast cancer  Patient is here by herself today. [place] interpreter online service was used. She reports feeling well.  Denies any nausea vomiting diarrhea, numbness or tingling.  No shortness of breath  Review of Systems  Unable to perform ROS: Other (intellectual/Cognitive impairment)  Constitutional:  Negative for appetite change, chills, fatigue and fever.  HENT:   Negative for hearing loss and voice change.   Eyes:  Negative for eye problems.  Respiratory:  Negative for chest tightness and cough.   Cardiovascular:  Negative for chest pain.  Gastrointestinal:  Negative for abdominal distention, abdominal pain and blood in stool.  Endocrine: Negative for hot flashes.  Genitourinary:  Negative for difficulty urinating and frequency.   Musculoskeletal:  Negative for arthralgias.  Skin:  Negative for itching and rash.  Neurological:  Negative for extremity weakness.  Hematological:  Negative for adenopathy.  Psychiatric/Behavioral:  Negative for confusion.    MEDICAL HISTORY:  Past Medical History:  Diagnosis Date   Down syndrome    Seizure [facility])     SURGICAL HISTORY: Past Surgical History:  Procedure Laterality Date   BREAST BIOPSY Left 03/10/2021   [place] BX,ribbon clip, [place]   BREAST LUMPECTOMY,RADIO FREQ LOCALIZER,AXILLARY SENTINEL LYMPH NODE BIOPSY Left 04/09/2021   Procedure: BREAST LUMPECTOMY,RADIO FREQ LOCALIZER,AXILLARY SENTINEL LYMPH NODE BIOPSY;  Surgeon: Benjamine Sprague, DO;  Location: [facility] ORS;  Service: General;  Laterality: Left;    SOCIAL HISTORY: Social History   Socioeconomic History    Marital status: Single    Spouse name: Not on file   Number of children: Not on file   Years of education: Not on file   Highest education level: Not on file  Occupational History   Not on file  Tobacco Use   Smoking status: Never   Smokeless tobacco: Never  Vaping Use   Vaping Use: Never used  Substance and Sexual Activity   Alcohol use: Never   Drug use: Not Currently   Sexual activity: Not on file  Other Topics Concern   Not on file  Social History Narrative   Not on file   Social Determinants of Health   Financial Resource Strain: Not on file  Food Insecurity: Not on file  Transportation Needs: Not on file  Physical Activity: Not on file  Stress: Not on file  Social Connections: Not on file  Intimate Partner Violence: Not on file    FAMILY HISTORY: History reviewed. No pertinent family history.  ALLERGIES:  has No Known Allergies.  MEDICATIONS:  Current Outpatient Medications  Medication Sig Dispense Refill   acetaminophen (TYLENOL) 325 MG tablet Take 325-650 mg by mouth every 6 (six) hours as needed for moderate pain.     ethosuximide (ZARONTIN) 250 MG capsule Take 250 mg by mouth 2 (two) times daily.     VITAMIN D PO Take 1 capsule by mouth daily.     HYDROcodone-acetaminophen (NORCO) 5-325 MG tablet Take 1 tablet by mouth every 6 (six) hours as needed for up to 6 doses for moderate pain. (Patient not taking: No sig reported) 6 tablet 0   ibuprofen (ADVIL) 800 MG tablet Take 1 tablet (800 mg total) by mouth every 8 (eight) hours as needed for mild pain or moderate pain. (Patient not taking: Reported on 05/22/2021) 30 tablet 0   ondansetron (ZOFRAN) 8 MG tablet Take 1 tablet (8 mg total) by mouth 2 (two) times daily as needed (Nausea or vomiting). (Patient not taking: No sig reported) 30 tablet 1   No current facility-administered medications for this visit.     PHYSICAL EXAMINATION: ECOG PERFORMANCE STATUS: 0 - Asymptomatic Vitals:   05/22/21 0841  BP:  121/83  Pulse: 85  Resp: 18  Temp: 97.8 F (36.6 C)   Filed Weights   05/22/21 0841  Weight: 122 lb (55.3 kg)    Physical Exam Constitutional:      General: She is not in acute distress. HENT:     Head: Normocephalic and atraumatic.  Eyes:     General: No scleral icterus. Cardiovascular:     Rate and Rhythm: Normal rate and regular rhythm.     Heart sounds: Normal heart sounds.  Pulmonary:     Effort: Pulmonary effort is normal. No respiratory distress.     Breath sounds: No wheezing.  Abdominal:     General: Bowel sounds are normal. There is no distension.     Palpations: Abdomen is soft.  Musculoskeletal:        General: No deformity. Normal range of motion.     Cervical back: Normal range of motion and neck supple.  Skin:    General: Skin is warm and dry.     Findings: No erythema or rash.  Neurological:     Mental Status: She is alert. Mental status is at baseline.     Cranial Nerves: No cranial nerve  deficit.     Coordination: Coordination normal.  Psychiatric:        Mood and Affect: Mood normal.   .   LABORATORY DATA:  I have reviewed the data as listed Lab Results  Component Value Date   WBC 4.0 05/22/2021   HGB 12.0 05/22/2021   HCT 36.1 05/22/2021   MCV 96.5 05/22/2021   PLT 333 05/22/2021   Recent Labs    05/08/21 0809 05/15/21 0854 05/22/21 0820  NA 136 135 136  K 3.6 3.7 3.7  CL 105 103 105  CO2 23 25 24   GLUCOSE 156* 112* 142*  BUN 11 11 10   CREATININE 0.63 0.64 0.56  CALCIUM 9.3 9.3 9.2  GFRNONAA >60 >60 >60  PROT 7.6 7.4 6.9  ALBUMIN 4.3 4.3 3.9  AST 25 24 26   ALT 17 25 27   ALKPHOS 54 49 47  BILITOT 0.2* 0.4 0.5    Iron/TIBC/Ferritin/ %Sat No results found for: IRON, TIBC, FERRITIN, IRONPCTSAT    RADIOGRAPHIC STUDIES: I have personally reviewed the radiological images as listed and agreed with the findings in the report. ECHOCARDIOGRAM COMPLETE  Result Date: 05/06/2021    ECHOCARDIOGRAM REPORT   Patient Name:   [facility]  [place] Date of Exam: 05/06/2021 Medical Rec #:  [id number]   Height:       61.0 in Accession #:    [id number]  Weight:       115.0 lb Date of Birth:  [birth year]   BSA:          1.493 m Patient Age:    76 years    BP:           127/84 mmHg Patient Gender: F           HR:           98 bpm. Exam Location:  [facility] Procedure: 2D Echo, Color Doppler, Cardiac Doppler and Strain Analysis Indications:     Z09 Chemotherapy  History:         Patient has no prior history of Echocardiogram examinations.                  Down's syndrome                  Invasive carcinoma of breast                  Encounter for monitoring cardiotoxic drug therapy.  Sonographer:     Charmayne Sheer Referring Phys:  [id number] Val Schiavo Diagnosing Phys: Kate Sable MD  Sonographer Comments: Global longitudinal strain was attempted. IMPRESSIONS  1. Left ventricular ejection fraction, by estimation, is 65 to 70%. The left ventricle has normal function. The left ventricle has no regional wall motion abnormalities. Left ventricular diastolic parameters were normal.  2. Right ventricular systolic function is normal. The right ventricular size is normal.  3. The mitral valve is normal in structure. No evidence of mitral valve regurgitation.  4. The aortic valve is grossly normal. Aortic valve regurgitation is not visualized.  5. The inferior vena cava is normal in size with greater than 50% respiratory variability, suggesting right atrial pressure of 3 mmHg. FINDINGS  Left Ventricle: Left ventricular ejection fraction, by estimation, is 65 to 70%. The left ventricle has normal function. The left ventricle has no regional wall motion abnormalities. Global longitudinal strain performed but not reported based on interpreter judgement due to suboptimal tracking. The left ventricular internal cavity size was normal in size. There is  no left ventricular hypertrophy. Left ventricular diastolic parameters were normal. Right Ventricle: The right ventricular size is normal.  No increase in right ventricular wall thickness. Right ventricular systolic function is normal. Left Atrium: Left atrial size was normal in size. Right Atrium: Right atrial size was normal in size. Pericardium: There is no evidence of pericardial effusion. Mitral Valve: The mitral valve is normal in structure. No evidence of mitral valve regurgitation. MV peak gradient, 3.2 mmHg. The mean mitral valve gradient is 2.0 mmHg. Tricuspid Valve: The tricuspid valve is normal in structure. Tricuspid valve regurgitation is not demonstrated. Aortic Valve: The aortic valve is grossly normal. Aortic valve regurgitation is not visualized. Aortic valve mean gradient measures 5.0 mmHg. Aortic valve peak gradient measures 8.5 mmHg. Aortic valve area, by VTI measures 1.69 cm. Pulmonic Valve: The pulmonic valve was not well visualized. Pulmonic valve regurgitation is not visualized. Aorta: The aortic root is normal in size and structure. Venous: The inferior vena cava is normal in size with greater than 50% respiratory variability, suggesting right atrial pressure of 3 mmHg. IAS/Shunts: No atrial level shunt detected by color flow Doppler.  LEFT VENTRICLE PLAX 2D LVIDd:         3.97 cm  Diastology LVIDs:         2.43 cm  LV e' medial:    8.70 cm/s LV PW:         0.63 cm  LV E/e' medial:  10.5 LV IVS:        0.70 cm  LV e' lateral:   12.60 cm/s LVOT diam:     1.70 cm  LV E/e' lateral: 7.2 LV SV:         44 LV SV Index:   30 LVOT Area:     2.27 cm  RIGHT VENTRICLE RV Basal diam:  2.29 cm RV S prime:     14.30 cm/s LEFT ATRIUM           Index       RIGHT ATRIUM          Index LA diam:      2.40 cm 1.61 cm/m  RA Area:     7.06 cm LA Vol (A4C): 15.1 ml 10.11 ml/m RA Volume:   12.40 ml 8.31 ml/m  AORTIC VALVE                    PULMONIC VALVE AV Area (Vmax):    1.65 cm     PV Vmax:       0.96 m/s AV Area (Vmean):   1.56 cm     PV Vmean:      73.600 cm/s AV Area (VTI):     1.69 cm     PV VTI:        0.191 m AV Vmax:            146.00 cm/s  PV Peak grad:  3.7 mmHg AV Vmean:          101.000 cm/s PV Mean grad:  2.0 mmHg AV VTI:            0.264 m AV Peak Grad:      8.5 mmHg AV Mean Grad:      5.0 mmHg LVOT Vmax:         106.00 cm/s LVOT Vmean:        69.400 cm/s LVOT VTI:          0.196 m LVOT/AV VTI ratio: 0.74  AORTA Ao  Root diam: 2.90 cm MITRAL VALVE MV Area (PHT): 3.70 cm    SHUNTS MV Area VTI:   2.00 cm    Systemic VTI:  0.20 m MV Peak grad:  3.2 mmHg    Systemic Diam: 1.70 cm MV Mean grad:  2.0 mmHg MV Vmax:       0.89 m/s MV Vmean:      65.5 cm/s MV Decel Time: 205 msec MV E velocity: 91.30 cm/s MV A velocity: 83.60 cm/s MV E/A ratio:  1.09 Kate Sable MD Electronically signed by Kate Sable MD Signature Date/Time: 05/06/2021/7:01:02 PM    Final       ASSESSMENT & PLAN:  1. Encounter for antineoplastic chemotherapy   2. Invasive carcinoma of breast ([place])   Cancer Staging Invasive carcinoma of breast ([place]) Staging form: Breast, AJCC 8th Edition - Pathologic stage from 04/22/2021: Stage IA (pT1c, pN0, cM0, G2, ER+, PR+, HER2+) - Signed by Earlie Server, MD on 04/22/2021   Cancer Staging Invasive carcinoma of breast [facility]) Staging form: Breast, AJCC 8th Edition - Pathologic stage from 04/22/2021: Stage IA (pT1c, pN0, cM0, G2, ER+, PR+, HER2+) - Signed by Earlie Server, MD on 04/22/2021  #Left invasive carcinoma of breast,pT1c pN0 cM0 HER2 positive ER 11-50% positive,  PR 1-10% positive.   Labs are reviewed and discussed with patient. Counts are stable To proceed with cycle 3 Taxol and trastuzumab  Patient is not sexually active.  Patient has cognitive impairment.  I will check hCG urine every 2 weeks prior to the chemo. Supportive care measures are necessary for patient well-being and will be provided as necessary. We spent sufficient time to discuss many aspect of care, questions were answered to patient's satisfaction.     All questions were answered. The patient knows to call the clinic with any problems  questions or concerns.  cc Kirk Ruths, MD   Follow-up in 1 week for lab MD Taxol and trastuzumab treatments. Earlie Server, MD, PhD.  Hematology [place] at [facility]  05/22/2021

## 2021-05-22 NOTE — Patient Instructions (Signed)
[facility]  Discharge Instructions: Thank you for choosing [facility] to provide your oncology and hematology care.  If you have a lab appointment with the [facility], please go directly to the [facility] and check in at the registration area.  Wear comfortable clothing and clothing appropriate for easy access to any Portacath or PICC line.   We strive to give you quality time with your provider. You may need to reschedule your appointment if you arrive late (15 or more minutes).  Arriving late affects you and other patients whose appointments are after yours.  Also, if you miss three or more appointments without notifying the office, you may be dismissed from the clinic at the provider's discretion.      For prescription refill requests, have your pharmacy contact our office and allow 72 hours for refills to be completed.    Today you received the following chemotherapy and/or immunotherapy agents: Herceptin / Taxol   To help prevent nausea and vomiting after your treatment, we encourage you to take your nausea medication as directed.  BELOW ARE SYMPTOMS THAT SHOULD BE REPORTED IMMEDIATELY: *FEVER GREATER THAN 100.4 F (38 C) OR HIGHER *CHILLS OR SWEATING *NAUSEA AND VOMITING THAT IS NOT CONTROLLED WITH YOUR NAUSEA MEDICATION *UNUSUAL SHORTNESS OF BREATH *UNUSUAL BRUISING OR BLEEDING *URINARY PROBLEMS (pain or burning when urinating, or frequent urination) *BOWEL PROBLEMS (unusual diarrhea, constipation, pain near the anus) TENDERNESS IN MOUTH AND THROAT WITH OR WITHOUT PRESENCE OF ULCERS (sore throat, sores in mouth, or a toothache) UNUSUAL RASH, SWELLING OR PAIN  UNUSUAL VAGINAL DISCHARGE OR ITCHING   Items with * indicate a potential emergency and should be followed up as soon as possible or go to the Emergency Department if any problems should occur.  Please show the CHEMOTHERAPY ALERT CARD or IMMUNOTHERAPY ALERT CARD at  check-in to the Emergency Department and triage nurse.  Should you have questions after your visit or need to cancel or reschedule your appointment, please contact [facility]  [phone] and follow the prompts.  Office hours are 8:00 a.m. to 4:30 p.m. Monday - Friday. Please note that voicemails left after 4:00 p.m. may not be returned until the following business day.  We are closed weekends and major holidays. You have access to a nurse at all times for urgent questions. Please call the main number to the clinic [phone] and follow the prompts.  For any non-urgent questions, you may also contact your provider using MyChart. We now offer e-Visits for anyone 43 and older to request care online for non-urgent symptoms. For details visit [email or web address].   Also download the MyChart app! Go to the app store, search "MyChart", open the app, select [facility] [place], and log in with your MyChart username and password.  Due to Covid, a mask is required upon entering the hospital/clinic. If you do not have a mask, one will be given to you upon arrival. For doctor visits, patients may have 1 support person aged 43 or older with them. For treatment visits, patients cannot have anyone with them due to current Covid guidelines and our immunocompromised population.

## 2021-05-22 NOTE — Progress Notes (Signed)
Patient here for follow up. No new concerns voiced.  °

## 2021-05-29 ENCOUNTER — Inpatient Hospital Stay: Payer: Medicare Other

## 2021-05-29 ENCOUNTER — Encounter: Payer: Self-pay | Admitting: Oncology

## 2021-05-29 ENCOUNTER — Inpatient Hospital Stay (HOSPITAL_BASED_OUTPATIENT_CLINIC_OR_DEPARTMENT_OTHER): Payer: Medicare Other | Admitting: Oncology

## 2021-05-29 VITALS — BP 114/75 | HR 85 | Temp 97.7°F | Resp 18 | Wt 122.0 lb

## 2021-05-29 DIAGNOSIS — C50919 Malignant neoplasm of unspecified site of unspecified female breast: Secondary | ICD-10-CM

## 2021-05-29 DIAGNOSIS — Z5112 Encounter for antineoplastic immunotherapy: Secondary | ICD-10-CM | POA: Diagnosis not present

## 2021-05-29 DIAGNOSIS — Z5111 Encounter for antineoplastic chemotherapy: Secondary | ICD-10-CM

## 2021-05-29 LAB — CBC WITH DIFFERENTIAL/PLATELET
Abs Immature Granulocytes: 0.03 10*3/uL (ref 0.00–0.07)
Basophils Absolute: 0.1 10*3/uL (ref 0.0–0.1)
Basophils Relative: 1 %
Eosinophils Absolute: 0.2 10*3/uL (ref 0.0–0.5)
Eosinophils Relative: 4 %
HCT: 37.2 % (ref 36.0–46.0)
Hemoglobin: 12.4 g/dL (ref 12.0–15.0)
Immature Granulocytes: 1 %
Lymphocytes Relative: 35 %
Lymphs Abs: 1.6 10*3/uL (ref 0.7–4.0)
MCH: 31.9 pg (ref 26.0–34.0)
MCHC: 33.3 g/dL (ref 30.0–36.0)
MCV: 95.6 fL (ref 80.0–100.0)
Monocytes Absolute: 0.4 10*3/uL (ref 0.1–1.0)
Monocytes Relative: 8 %
Neutro Abs: 2.3 10*3/uL (ref 1.7–7.7)
Neutrophils Relative %: 51 %
Platelets: 393 10*3/uL (ref 150–400)
RBC: 3.89 MIL/uL (ref 3.87–5.11)
RDW: 12.6 % (ref 11.5–15.5)
WBC: 4.5 10*3/uL (ref 4.0–10.5)
nRBC: 0 % (ref 0.0–0.2)

## 2021-05-29 LAB — COMPREHENSIVE METABOLIC PANEL
ALT: 28 U/L (ref 0–44)
AST: 28 U/L (ref 15–41)
Albumin: 3.9 g/dL (ref 3.5–5.0)
Alkaline Phosphatase: 48 U/L (ref 38–126)
Anion gap: 9 (ref 5–15)
BUN: 13 mg/dL (ref 6–20)
CO2: 26 mmol/L (ref 22–32)
Calcium: 9.2 mg/dL (ref 8.9–10.3)
Chloride: 101 mmol/L (ref 98–111)
Creatinine, Ser: 0.68 mg/dL (ref 0.44–1.00)
GFR, Estimated: >60 mL/min (ref >60)
Glucose, Bld: 161 mg/dL — ABNORMAL HIGH (ref 70–99)
Potassium: 3.7 mmol/L (ref 3.5–5.1)
Sodium: 136 mmol/L (ref 135–145)
Total Bilirubin: 0.4 mg/dL (ref 0.3–1.2)
Total Protein: 6.9 g/dL (ref 6.5–8.1)

## 2021-05-29 MED ORDER — DIPHENHYDRAMINE HCL 50 MG/ML IJ SOLN
50.0000 mg | Freq: Once | INTRAMUSCULAR | Status: AC
  Administered 2021-05-29: 50 mg via INTRAVENOUS
  Filled 2021-05-29: qty 1

## 2021-05-29 MED ORDER — TRASTUZUMAB-DKST CHEMO 150 MG IV SOLR
2.0000 mg/kg | Freq: Once | INTRAVENOUS | Status: AC
  Administered 2021-05-29: 105 mg via INTRAVENOUS
  Filled 2021-05-29: qty 5

## 2021-05-29 MED ORDER — SODIUM CHLORIDE 0.9 % IV SOLN
Freq: Once | INTRAVENOUS | Status: AC
  Filled 2021-05-29: qty 250

## 2021-05-29 MED ORDER — SODIUM CHLORIDE 0.9 % IV SOLN
80.0000 mg/m2 | Freq: Once | INTRAVENOUS | Status: AC
  Administered 2021-05-29: 120 mg via INTRAVENOUS
  Filled 2021-05-29: qty 20

## 2021-05-29 MED ORDER — FAMOTIDINE 20 MG IN NS 100 ML IVPB
20.0000 mg | Freq: Once | INTRAVENOUS | Status: AC
  Administered 2021-05-29: 20 mg via INTRAVENOUS
  Filled 2021-05-29: qty 20

## 2021-05-29 MED ORDER — SODIUM CHLORIDE 0.9 % IV SOLN
10.0000 mg | Freq: Once | INTRAVENOUS | Status: AC
  Administered 2021-05-29: 10 mg via INTRAVENOUS
  Filled 2021-05-29: qty 10

## 2021-05-29 MED ORDER — ACETAMINOPHEN 325 MG PO TABS
650.0000 mg | ORAL_TABLET | Freq: Once | ORAL | Status: AC
  Administered 2021-05-29: 650 mg via ORAL
  Filled 2021-05-29: qty 2

## 2021-05-29 MED ORDER — SODIUM CHLORIDE 0.9% FLUSH
10.0000 mL | INTRAVENOUS | Status: DC | PRN
  Filled 2021-05-29: qty 10

## 2021-05-29 NOTE — Progress Notes (Signed)
Pt here for follow up. No new concerns voiced.   

## 2021-05-29 NOTE — Patient Instructions (Addendum)
[facility]  Discharge Instructions: Thank you for choosing [facility] to provide your oncology and hematology care.  If you have a lab appointment with the [facility], please go directly to the [facility] and check in at the registration area.  Wear comfortable clothing and clothing appropriate for easy access to any Portacath or PICC line.   We strive to give you quality time with your provider. You may need to reschedule your appointment if you arrive late (15 or more minutes).  Arriving late affects you and other patients whose appointments are after yours.  Also, if you miss three or more appointments without notifying the office, you may be dismissed from the clinic at the provider's discretion.      For prescription refill requests, have your pharmacy contact our office and allow 72 hours for refills to be completed.    Today you received the following chemotherapy and/or immunotherapy agents - paclitaxel, trastuzumab      To help prevent nausea and vomiting after your treatment, we encourage you to take your nausea medication as directed.  BELOW ARE SYMPTOMS THAT SHOULD BE REPORTED IMMEDIATELY: *FEVER GREATER THAN 100.4 F (38 C) OR HIGHER *CHILLS OR SWEATING *NAUSEA AND VOMITING THAT IS NOT CONTROLLED WITH YOUR NAUSEA MEDICATION *UNUSUAL SHORTNESS OF BREATH *UNUSUAL BRUISING OR BLEEDING *URINARY PROBLEMS (pain or burning when urinating, or frequent urination) *BOWEL PROBLEMS (unusual diarrhea, constipation, pain near the anus) TENDERNESS IN MOUTH AND THROAT WITH OR WITHOUT PRESENCE OF ULCERS (sore throat, sores in mouth, or a toothache) UNUSUAL RASH, SWELLING OR PAIN  UNUSUAL VAGINAL DISCHARGE OR ITCHING   Items with * indicate a potential emergency and should be followed up as soon as possible or go to the Emergency Department if any problems should occur.  Please show the CHEMOTHERAPY ALERT CARD or IMMUNOTHERAPY ALERT  CARD at check-in to the Emergency Department and triage nurse.  Should you have questions after your visit or need to cancel or reschedule your appointment, please contact [facility]  [phone] and follow the prompts.  Office hours are 8:00 a.m. to 4:30 p.m. Monday - Friday. Please note that voicemails left after 4:00 p.m. may not be returned until the following business day.  We are closed weekends and major holidays. You have access to a nurse at all times for urgent questions. Please call the main number to the clinic [phone] and follow the prompts.  For any non-urgent questions, you may also contact your provider using MyChart. We now offer e-Visits for anyone 66 and older to request care online for non-urgent symptoms. For details visit [email or web address].   Also download the MyChart app! Go to the app store, search "MyChart", open the app, select [facility], and log in with your MyChart username and password.  Due to Covid, a mask is required upon entering the hospital/clinic. If you do not have a mask, one will be given to you upon arrival. For doctor visits, patients may have 1 support person aged 18 or older with them. For treatment visits, patients cannot have anyone with them due to current Covid guidelines and our immunocompromised population.   Paclitaxel injection What is this medication? PACLITAXEL (PAK li TAX el) is a chemotherapy drug. It targets fast dividing cells, like cancer cells, and causes these cells to die. This medicine is used to treat ovarian cancer, breast cancer, lung cancer, Kaposi's sarcoma, and other cancers. This medicine may be used for other  purposes; ask your health care provider or pharmacist if you have questions. COMMON BRAND NAME(S): Onxol, Taxol What should I tell my care team before I take this medication? They need to know if you have any of these conditions: history of irregular heartbeat liver  disease low blood counts, like low white cell, platelet, or red cell counts lung or breathing disease, like asthma tingling of the fingers or toes, or other nerve disorder an unusual or allergic reaction to paclitaxel, alcohol, polyoxyethylated castor oil, other chemotherapy, other medicines, foods, dyes, or preservatives pregnant or trying to get pregnant breast-feeding How should I use this medication? This drug is given as an infusion into a vein. It is administered in a hospital or clinic by a specially trained health care professional. Talk to your pediatrician regarding the use of this medicine in children. Special care may be needed. Overdosage: If you think you have taken too much of this medicine contact a poison control center or emergency room at once. NOTE: This medicine is only for you. Do not share this medicine with others. What if I miss a dose? It is important not to miss your dose. Call your doctor or health care professional if you are unable to keep an appointment. What may interact with this medication? Do not take this medicine with any of the following medications: live virus vaccines This medicine may also interact with the following medications: antiviral medicines for hepatitis, HIV or AIDS certain antibiotics like erythromycin and clarithromycin certain medicines for fungal infections like ketoconazole and itraconazole certain medicines for seizures like carbamazepine, phenobarbital, phenytoin gemfibrozil nefazodone rifampin St. John's wort This list may not describe all possible interactions. Give your health care provider a list of all the medicines, herbs, non-prescription drugs, or dietary supplements you use. Also tell them if you smoke, drink alcohol, or use illegal drugs. Some items may interact with your medicine. What should I watch for while using this medication? Your condition will be monitored carefully while you are receiving this medicine. You  will need important blood work done while you are taking this medicine. This medicine can cause serious allergic reactions. To reduce your risk you will need to take other medicine(s) before treatment with this medicine. If you experience allergic reactions like skin rash, itching or hives, swelling of the face, lips, or tongue, tell your doctor or health care professional right away. In some cases, you may be given additional medicines to help with side effects. Follow all directions for their use. This drug may make you feel generally unwell. This is not uncommon, as chemotherapy can affect healthy cells as well as cancer cells. Report any side effects. Continue your course of treatment even though you feel ill unless your doctor tells you to stop. Call your doctor or health care professional for advice if you get a fever, chills or sore throat, or other symptoms of a cold or flu. Do not treat yourself. This drug decreases your body's ability to fight infections. Try to avoid being around people who are sick. This medicine may increase your risk to bruise or bleed. Call your doctor or health care professional if you notice any unusual bleeding. Be careful brushing and flossing your teeth or using a toothpick because you may get an infection or bleed more easily. If you have any dental work done, tell your dentist you are receiving this medicine. Avoid taking products that contain aspirin, acetaminophen, ibuprofen, naproxen, or ketoprofen unless instructed by your doctor. These medicines may  hide a fever. Do not become pregnant while taking this medicine. Women should inform their doctor if they wish to become pregnant or think they might be pregnant. There is a potential for serious side effects to an unborn child. Talk to your health care professional or pharmacist for more information. Do not breast-feed an infant while taking this medicine. Men are advised not to father a child while receiving this  medicine. This product may contain alcohol. Ask your pharmacist or healthcare provider if this medicine contains alcohol. Be sure to tell all healthcare providers you are taking this medicine. Certain medicines, like metronidazole and disulfiram, can cause an unpleasant reaction when taken with alcohol. The reaction includes flushing, headache, nausea, vomiting, sweating, and increased thirst. The reaction can last from 30 minutes to several hours. What side effects may I notice from receiving this medication? Side effects that you should report to your doctor or health care professional as soon as possible: allergic reactions like skin rash, itching or hives, swelling of the face, lips, or tongue breathing problems changes in vision fast, irregular heartbeat high or low blood pressure mouth sores pain, tingling, numbness in the hands or feet signs of decreased platelets or bleeding - bruising, pinpoint red spots on the skin, black, tarry stools, blood in the urine signs of decreased red blood cells - unusually weak or tired, feeling faint or lightheaded, falls signs of infection - fever or chills, cough, sore throat, pain or difficulty passing urine signs and symptoms of liver injury like dark yellow or brown urine; general ill feeling or flu-like symptoms; light-colored stools; loss of appetite; nausea; right upper belly pain; unusually weak or tired; yellowing of the eyes or skin swelling of the ankles, feet, hands unusually slow heartbeat Side effects that usually do not require medical attention (report to your doctor or health care professional if they continue or are bothersome): diarrhea hair loss loss of appetite muscle or joint pain nausea, vomiting pain, redness, or irritation at site where injected tiredness This list may not describe all possible side effects. Call your doctor for medical advice about side effects. You may report side effects to FDA at 1-800-FDA-1088. Where  should I keep my medication? This drug is given in a hospital or clinic and will not be stored at home. NOTE: This sheet is a summary. It may not cover all possible information. If you have questions about this medicine, talk to your doctor, pharmacist, or health care provider.  2022 Elsevier/Gold Standard (2019-08-01 13:37:23)  Paclitaxel injection ?y l thu?c g? PACLITAXEL l thu?c ha tr? li?u. N nh?m ??n cc t? bo phn chia nhanh, nh? cc t? bo ung th?, v lm cho cc t? bo ? ch?t. Thu?c ny ???c dng ?? ?i?u tr? ung th? bu?ng tr?ng, ung th? v, ung th? ph?i, ung th? Kaposi (Kaposi's sarcoma) v cc b?nh ung th? khc. Thu?c ny c th? ???c dng cho nh?ng m?c ?ch khc; hy h?i ng??i cung c?p d?ch v? y t? ho?c d??c s? c?a mnh, n?u qu v? c th?c m?c. (CC) NHN HI?U PH? BI?N: Onxol, Taxol Ti c?n ph?i bo cho ng??i cung c?p d?ch v? y t? c?a mnh ?i?u g tr??c khi dng thu?c ny? H? c?n bi?t li?u qu v? c b?t k? tnh tr?ng no sau ?y khng: ti?n s? tim ??p khng ??u b?nh gan s? l??ng t? bo mu th?p, ch?ng h?n nh? s? l??ng b?ch c?u, ti?u c?u, ho?c h?ng c?u th?p b?nh ph?i ho??c h h?p,  ch??ng ha?n nh? hen suy?n c?m gic nh? b? ki?n b ? ngn tay ho?c ngn chn, ho?c cc r?i lo?n th?n kinh khc c pha?n ??ng b?t th???ng ho??c di? ??ng v??i paclitaxel, c?n, d?u castor nh? ha (polyoxyethylated) ho?c ha tr? li?u khc pha?n ??ng b?t th???ng ho??c di? ??ng v??i ca?c d??c ph?m kha?c, th?c ph?m, thu?c nhu?m, ho??c ch?t ba?o qua?n ?ang c thai ho??c ??nh co? thai ?ang cho con bu? Ti nn s? d?ng thu?c ny nh? th? no? Thu?c ny ?? truy?n vo t?nh m?ch. Thu?c ny ???c cho trong b?nh vi?n ho?c phng m?ch b?i chuyn vin y t? ???c hu?n luy?n ??c bi?t. Hy bn v?i bc s? nhi khoa c?a qu v? v? vi?c dng thu?c ny ? tr? em. C th? c?n ch?m [place] ??c bi?t. Qu li?u: N?u qu v? cho r?ng mnh ? dng qu nhi?u thu?c ny, th hy lin l?c v?i trung tm ki?m sot ch?t ??c ho?c phng c?p c?u  ngay l?p t?c. L?U : Thu?c ny ch? dnh ring cho qu v?. Khng chia s? thu?c ny v?i nh?ng ng??i khc. N?u ti l? qun m?t li?u th sao? ?i?u quan tr?ng l khng nn b? l? li?u thu?c no. Hy lin l?c v?i bc s? ho?c [place] vin y t? c?a mnh, n?u qu v? khng th? gi? ?ng cu?c h?n khm. Nh?ng g c th? t??ng tc v?i thu?c ny? Khng ???c dng thu?c ny cng v?i b?t k? th? no sau ?y: cc thu?c ch?ng ng?a d?ng virus s?ng Thu?c ny c?ng c th? t??ng tc v?i cc thu?c sau ?y: ca?c thu?c kha?ng virus dng ?? tr? vim gan, nhi?m HIV ho?c AIDS m?t s? thu?c khng sinh, ch?ng h?n nh? erythromycin v clarithromycin m?t s? thu?c dng ?? tr? cc b?nh nhi?m n?m, ch?ng h?n nh? itraconazole, ketoconazole m?t s? thu?c dng cho cc ch?ng co gi?t, ch?ng h?n nh? carbamazepine, phenobarbital, phenytoin gemfibrozil nefazodone rifampicin cy [facility]. John's Wort (c? [facility]/cy n?c s?i/cy l?nh) Danh sch ny c th? khng m t? ?? h?t cc t??ng tc c th? x?y ra. Hy ??a cho ng??i cung c?p d?ch v? y t? c?a mnh danh sch t?t c? cc thu?c, th?o d??c, cc thu?c khng c?n toa, ho?c cc ch? ph?m b? sung m qu v? dng. C?ng nn bo cho h? bi?t r?ng qu v? c ht thu?c, u?ng r??u, ho?c c s? d?ng ma ty tri php hay khng. Vi th? c th? t??ng tc v?i thu?c c?a qu v?. Ti c?n ph?i theo di ?i?u g trong khi dng thu?c ny? Qu v? s? ???c theo di ch?t ch? trong khi dng thu?c ny. Qu v? s? c?n ph?i ?i lm cc xt nghi?m mu quan tr?ng trong th?i gian dng thu?c ny. Thu?c ny c th? gy ra nh?ng ph?n ?ng d? ?ng nghim tr?ng. ?? gi?m nguy c?, qu v? s? c?n ph?i dng (cc) thu?c khc tr??c khi ?i?u tr? b?ng thu?c ny. N?u qu v? b? cc ph?n ?ng d? ?ng, ch?ng h?n nh? da m?n ??, ng?a ho?c n?i my ?ay, s?ng ? m?t, mi ho?c l??i, th hy bo ngay cho bc s? ho?c [place] vin y t? c?a mnh. Trong m?t s? tr??ng h?p, qu v? c th? ???c cho dng cc thu?c ph? thm ?? gip gi?m tc d?ng ph?. Hy lm theo t?t c? cc h??ng d?n v?  vi?c s? d?ng chng. Thu?c ny c th? lm cho qu v? c?m th?y khng ???c kh?e nh? th??ng l?. ?i?u ny khng ph?i khng  ph? bi?n, b?i v thu?c ha tr? li?u c th? ?nh h??ng ??n c? t? bo lnh l?n t? bo ung th?. Hy t??ng trnh m?i tc d?ng ph?. Hy ti?p t?c ??t ?i?u tr? c?a mnh ngay c? khi qu v? c?m th?y m?t, tr? khi bc s? yu c?u qu v? ng?ng ?i?u tr?. Hy h?i  ki?n bc s? ho?c chuyn vin y t?, n?u qu v? b? s?t, ?n l?nh ho?c ?au h?ng, ho?c c cc tri?u ch?ng khc c?a c?m l?nh ho?c cm. Khng ???c t? ?i?u tr? cho mnh. Thu?c ny c th? lm gi?m kh? n?ng ch?ng l?i cc b?nh nhi?m trng c?a c? th?. Hy c? trnh ? g?n nh?ng ng??i b? b?nh. Thu?c ny c th? lm t?ng nguy c? b? b?m tm ho?c ch?y mu. Hy lin l?c v?i bc s? ho?c chuyn vin y t?, n?u qu v? th?y ch?y mu b?t th??ng. Hy c?n th?n khi ?nh r?ng ho?c x?a r?ng b?ng ch? nha khoa ho?c b?ng t?m, b?i v qu v? c th? d? b? nhi?m trng ho?c d? b? ch?y mu h?n. N?u qu v? c ?i lm r?ng, th hy bo v?i nha s? r?ng qu v? ?ang dng thu?c ny. Trnh dng cc thu?c c ch?a aspirin, acetaminophen, ibuprofen, naproxen, ho?c ketoprofen, tr? khi ? ???c bc s? ch? d?n. Cc thu?c ny c th? che l?p tri?u ch?ng s?t. Khng ???c ?? c thai trong khi dng thu?c ny. Ph? n? c?n ph?i thng bo cho bc s? c?a mnh, n?u mu?n c thai ho?c ngh? r?ng c th? mnh ? c [place]. C nguy c? v? cc tc d?ng ph? nghim tr?ng ??i v?i [place] nhi. Hy th?o lu?n v?i bc s? ho?c chuyn vin y t? ho?c d??c s? ?? bi?t thm thng tin. Khng ???c nui con b?ng s?a m? trong khi dng thu?c ny. Nam gi?i ???c khuy?n co khng nn gy th? [place] trong khi dng thu?c ny. Ch? ph?m ny c th? c ch?a r??u. Hy h?i d??c s? ho?c chuyn vin y t? c?a mnh xem thu?c ny c ch?a r??u hay khng. Hy ch?c ch?n bo cho t?t c? cc chuyn vin y t? c?a mnh bi?t r?ng qu v? ?ang dng thu?c ny. M?t s? thu?c, ch?ng h?n nh? metronidazole v disulfiram, c th? gy ra ph?n ?ng kh ch?u khi ???c u?ng cng v?i  r??u. Ph?n ?ng ny bao g?m tnh tr?ng ph?ng m?t, ?au ??u, bu?n i, i, v m? hi, v kht n??c nhi?u h?n. Ph?n ?ng c th? ko di t? 30 pht ??n vi gi? ??ng h?. Ti c th? nh?n th?y nh?ng tc d?ng ph? no khi dng thu?c ny? Nh?ng tc d?ng ph? qu v? c?n ph?i bo cho bc s? ho?c chuyn vin y t? cng s?m cng t?t: cc ph?n ?ng d? ?ng, ch?ng h?n nh? da b? m?n ??, ng?a, n?i my ?ay, s?ng ? m?t, mi, ho?c l??i kh th? thay ??i th? l?c tim ??p nhanh ho?c khng ??u huy?t p cao ho?c th?p lot mi?ng ?au, t ho?c c?m gic nh? b? ki?n b ? bn tay ho?c bn chn cc d?u hi?u gi?m ti?u c?u ho?c xu?t huy?t - b?m tm, cc n?t l?m t?m ?? trn da, phn c mu ?en, mu h?c n, c mu trong n??c ti?u cc d?u hi?u gi?m s? l??ng t? bo h?ng c?u - c?m th?y y?u ?t ho?c m?t m?i m?t cch b?t th??ng, cc c?n ng?t x?u, chong vng cc d?u hi?u nhi?m trng - s?t ho?c ?n l?nh, ho, ?  au h?ng, kh ?i ti?u ho?c ?i ti?u ?au cc d?u hi?u v tri?u ch?ng t?n th??ng gan, ch?ng h?n nh? n??c ti?u mu nu ho?c vng s?m; c?m gic b? b?nh ki?u chung chung ho?c cc tri?u ch?ng gi?ng nh? cm; phn b?c mu; m?t c?m gic ngon mi?ng; bu?n i; ?au vng b?ng trn; y?u ?t ho?c m?t m?i khc th??ng; vng da ho?c m?t s?ng ? m?t c chn, bn chn, ho?c bn tay tim ??p ch?m m?t cch b?t th??ng Cc tc d?ng ph? khng c?n ph?i ch?m [employer] y t? (hy bo cho bc s? ho?c chuyn vin y t?, n?u cc tc d?ng ph? ny ti?p di?n ho?c gy phi?n toi): tiu ch?y r?ng tc m?t c?m gic ngon mi?ng ?au ? kh?p ho?c c? b?p bu?n i ho?c i m?a ?au, ??, ng?a, ho?c kch ?ng ? ch? tim c?m th?y m?t m?i [facility] sch ny c th? khng m t? ?? h?t cc tc d?ng ph? c th? x?y ra. Xin g?i t?i bc s? c?a mnh ?? ???c c? v?n chuyn mn v? cc tc d?ng ph?Sander Nephew v? c th? t??ng trnh cc tc d?ng ph? cho FDA theo s? [phone]. Ti nn c?t gi? thu?c c?a mnh ? ?u? Thu?c ny ???c s? d?ng b?i chuyn vin y t? ? b?nh vi?n ho?c ? phng m?ch. Qu v? s? khng ???c c?p thu?c ny  ?? c?t gi? t?i nh. L?U : ?y l b?n tm t?t. N c th? khng bao hm t?t c? thng tin c th? c. N?u qu v? th?c m?c v? thu?c ny, xin trao ??i v?i bc s?, d??c s?, ho?c ng??i cung c?p d?ch v? y t? c?a mnh.  2022 Elsevier/Gold Standard (2020-01-03 00:00:00)   Trastuzumab injection for infusion What is this medication? TRASTUZUMAB (tras TOO zoo mab) is a monoclonal antibody. It is used to treat breast cancer and stomach cancer. This medicine may be used for other purposes; ask your health care provider or pharmacist if you have questions. COMMON BRAND NAME(S): Herceptin, Janae Bridgeman, Ontruzant, Trazimera What should I tell my care team before I take this medication? They need to know if you have any of these conditions: heart disease heart failure lung or breathing disease, like asthma an unusual or allergic reaction to trastuzumab, benzyl alcohol, or other medications, foods, dyes, or preservatives pregnant or trying to get pregnant breast-feeding How should I use this medication? This drug is given as an infusion into a vein. It is administered in a hospital or clinic by a specially trained health care professional. Talk to your pediatrician regarding the use of this medicine in children. This medicine is not approved for use in children. Overdosage: If you think you have taken too much of this medicine contact a poison control center or emergency room at once. NOTE: This medicine is only for you. Do not share this medicine with others. What if I miss a dose? It is important not to miss a dose. Call your doctor or health care professional if you are unable to keep an appointment. What may interact with this medication? This medicine may interact with the following medications: certain types of chemotherapy, such as daunorubicin, doxorubicin, epirubicin, and idarubicin This list may not describe all possible interactions. Give your health care provider a list of all the  medicines, herbs, non-prescription drugs, or dietary supplements you use. Also tell them if you smoke, drink alcohol, or use illegal drugs. Some items may interact with your medicine. What should I watch for  while using this medication? Visit your doctor for checks on your progress. Report any side effects. Continue your course of treatment even though you feel ill unless your doctor tells you to stop. Call your doctor or health care professional for advice if you get a fever, chills or sore throat, or other symptoms of a cold or flu. Do not treat yourself. Try to avoid being around people who are sick. You may experience fever, chills and shaking during your first infusion. These effects are usually mild and can be treated with other medicines. Report any side effects during the infusion to your health care professional. Fever and chills usually do not happen with later infusions. Do not become pregnant while taking this medicine or for 7 months after stopping it. Women should inform their doctor if they wish to become pregnant or think they might be pregnant. Women of child-bearing potential will need to have a negative pregnancy test before starting this medicine. There is a potential for serious side effects to an unborn child. Talk to your health care professional or pharmacist for more information. Do not breast-feed an infant while taking this medicine or for 7 months after stopping it. Women must use effective birth control with this medicine. What side effects may I notice from receiving this medication? Side effects that you should report to your doctor or health care professional as soon as possible: allergic reactions like skin rash, itching or hives, swelling of the face, lips, or tongue chest pain or palpitations cough dizziness feeling faint or lightheaded, falls fever general ill feeling or flu-like symptoms signs of worsening heart failure like breathing problems; swelling in your  legs and feet unusually weak or tired Side effects that usually do not require medical attention (report to your doctor or health care professional if they continue or are bothersome): bone pain changes in taste diarrhea joint pain nausea/vomiting weight loss This list may not describe all possible side effects. Call your doctor for medical advice about side effects. You may report side effects to FDA at 1-800-FDA-1088. Where should I keep my medication? This drug is given in a hospital or clinic and will not be stored at home. NOTE: This sheet is a summary. It may not cover all possible information. If you have questions about this medicine, talk to your doctor, pharmacist, or health care provider.  2022 Elsevier/Gold Standard (2016-08-24 14:37:52)  Trastuzumab injection for infusion ?y l thu?c g? TRASTUZUMAB l m?t khng th? ??n dng. Thu?c ???c dng ?? ?i?u tr? ung th? v v ung th? bao t?. Thu?c ny c th? ???c dng cho nh?ng m?c ?ch khc; hy h?i ng??i cung c?p d?ch v? y t? ho?c d??c s? c?a mnh, n?u qu v? c th?c m?c. (CC) NHN HI?U PH? BI?N: Herceptin, [place], KANJINTI, [place], Ontruzant, Trazimera Ti c?n ph?i bo cho ng??i cung c?p d?ch v? y t? c?a mnh ?i?u g tr??c khi dng thu?c ny? H? c?n bi?t li?u qu v? c b?t k? tnh tr?ng no sau ?y khng: b?nh tim suy tim b?nh ph?i ho??c h h?p, ch??ng ha?n nh? hen suy?n pha?n ??ng b?t th???ng ho??c di? ??ng v??i trastuzumab ho?c c?n benzyl pha?n ??ng b?t th???ng ho??c di? ??ng v??i ca?c d??c ph?m kha?c, th?c ph?m, thu?c nhu?m, ho??c ch?t ba?o qua?n ?ang c thai ho??c ??nh co? thai ?ang cho con bu? Ti nn s? d?ng thu?c ny nh? th? no? Thu?c ny ?? truy?n vo t?nh m?ch. Thu?c ny ???c cho trong b?nh vi?n ho?c phng  m?ch b?i chuyn vin y t? ???c hu?n luy?n ??c bi?t. Hy bn v?i bc s? nhi khoa c?a qu v? v? vi?c dng thu?c ny ? tr? em. Thu?c ny khng ???c duy?t ?? dng cho tr? em. Qu li?u: N?u qu v? cho r?ng  mnh ? dng qu nhi?u thu?c ny, th hy lin l?c v?i trung tm ki?m sot ch?t ??c ho?c phng c?p c?u ngay l?p t?c. L?U : Thu?c ny ch? dnh ring cho qu v?. Khng chia s? thu?c ny v?i nh?ng ng??i khc. N?u ti l? qun m?t li?u th sao? ?i?u quan tr?ng l khng nn b? l? li?u thu?c no. Hy lin l?c v?i bc s? ho?c [place] vin y t? c?a mnh, n?u qu v? khng th? gi? ?ng cu?c h?n khm. Nh?ng g c th? t??ng tc v?i thu?c ny? Thu?c ny c th? t??ng tc v?i cc thu?c sau ?y: m?t s? lo?i thu?c ha tr? li?u, nh? l daunorubicin, doxorubicin, epirubicin v idarubicin Dan[facility] sch ny c th? khng m t? ?? h?t cc t??ng tc c th? x?y ra. Hy ??a cho ng??i cung c?p d?ch v? y t? c?a mnh danh sch t?t c? cc thu?c, th?o d??c, cc thu?c khng c?n toa, ho?c cc ch? ph?m b? sung m qu v? dng. C?ng nn bo cho h? bi?t r?ng qu v? c ht thu?c, u?ng r??u, ho?c c s? d?ng ma ty tri php hay khng. Vi th? c th? t??ng tc v?i thu?c c?a qu v?. Ti c?n ph?i theo di ?i?u g trong khi dng thu?c ny? Hy ?i g?p bc s? ho?c [place] vin y t? ?? theo di ??nh k? s? c?i thi?n c?a qu v?. Hy t??ng trnh m?i tc d?ng ph?. Hy ti?p t?c ??t ?i?u tr? c?a mnh ngay c? khi qu v? c?m th?y m?t, tr? khi bc s? yu c?u qu v? ng?ng ?i?u tr?. Hy h?i  ki?n bc s? ho?c chuyn vin y t?, n?u qu v? b? s?t, ?n l?nh ho?c ?au h?ng, ho?c c cc tri?u ch?ng khc c?a c?m l?nh ho?c cm. Khng ???c t? ?i?u tr? cho mnh. Hy c? trnh ? g?n nh?ng ng??i b? b?nh. Qu v? c th? b? s?t, c?m th?y ?n l?nh v run trong l?n truy?n ??u tin. Nh?ng tc d?ng ph? ny th??ng nh? v c th? ???c ?i?u tr? b?ng cc thu?c khc. Hy t??ng trnh cho bc s? ho?c chuyn vin y t? m?i tc d?ng ph? trong khi truy?n. S?t v ?n l?nh th??ng khng x?y ra trong cc l?n truy?n sau. Khng ???c c thai trong th?i gian dng thu?c ny ho?c trong vng 7 thng sau khi ng?ng dng thu?c ny. Ph? n? c?n ph?i thng bo cho bc s? c?a mnh, n?u mu?n c thai ho?c ngh? r?ng c  th? mnh ? c [place]. Phu? n?? co? kha? n?ng mang thai c?n pha?i co? xt nghi?m th? [place] m tnh tr??c khi b?t ??u dng thu?c na?y. C nguy c? v? cc tc d?ng ph? nghim tr?ng ??i v?i [place] nhi. Hy th?o lu?n v?i bc s? ho?c chuyn vin y t? ho?c d??c s? ?? bi?t thm thng tin. Khng ???c cho con b trong th?i gian dng thu?c ny ho?c trong vng 7 thng sau khi ng?ng dng thu?c. Ph? n? ph?i s? d?ng bi?n php ng?a thai hi?u qu? cng v?i thu?c ny. Ti c th? nh?n th?y nh?ng tc d?ng ph? no khi dng thu?c ny? Nh?ng tc d?ng ph? qu v? c?n ph?i bo cho bc s? ho?c [place]  vin y t? cng s?m cng t?t: cc ph?n ?ng d? ?ng, ch?ng h?n nh? da b? m?n ??, ng?a, n?i my ?ay, s?ng ? m?t, mi, ho?c l??i ?au ng?c ?nh tr?ng ng?c ho chng m?t c?m th?y chong vng, ng?t x?u, b? t s?t c?m th?y m?t m?i ho?c cc tri?u ch?ng gi?ng cm cc d?u hi?u suy tim n?ng h?n, ch?ng h?n nh? cc v?n ?? v? h h?p; b? s?ng ? chn v bn chn c?a mnh m?t m?i ho?c y?u ?t b?t th??ng Cc tc d?ng ph? khng c?n ph?i ch?m Sarah Ann y t? (hy bo cho bc s? ho?c chuyn vin y t?, n?u cc tc d?ng ph? ny ti?p di?n ho?c gy phi?n toi): ?au x??ng th?y v? c?a cc mn ?n thay ??i tiu ch?y ?au ho?c nh?c kh?p bu?n i ho?c i m?a gi?m cn Danh sch ny c th? khng m t? ?? h?t cc tc d?ng ph? c th? x?y ra. Xin g?i t?i bc s? c?a mnh ?? ???c c? v?n chuyn mn v? cc tc d?ng ph?Sander Nephew v? c th? t??ng trnh cc tc d?ng ph? cho FDA theo s? [phone]. Ti nn c?t gi? thu?c c?a mnh ? ?u? Thu?c ny ???c s? d?ng b?i chuyn vin y t? ? b?nh vi?n ho?c ? phng m?ch. Qu v? s? khng ???c c?p thu?c ny ?? c?t gi? t?i nh. L?U : ?y l b?n tm t?t. N c th? khng bao hm t?t c? thng tin c th? c. N?u qu v? th?c m?c v? thu?c ny, xin trao ??i v?i bc s?, d??c s?, ho?c ng??i cung c?p d?ch v? y t? c?a mnh.  2022 Elsevier/Gold Standard (2020-03-04 00:00:00)

## 2021-05-29 NOTE — Progress Notes (Signed)
Hematology/Oncology follow up  note [facility] [phone] [phone]   Patient Care Team: Kirk Ruths, MD as PCP - General (Internal Medicine)  REFERRING PROVIDER: Kirk Ruths, MD  CHIEF COMPLAINTS/REASON FOR VISIT:  Follow up for treatment of breast cancer  HISTORY OF PRESENTING ILLNESS:   Anna Banks is a  45 y.o.  female with PMH listed below was seen in consultation at the request of  Kirk Ruths, MD  for evaluation of breast cancer.   Patient was accompanied by her half sister today. Per sister, patient has history of seizure since childhood and has intellectual/cognitive impairment. She is illiterate. She follows instructions from her family members.  She lives with her mother and her half sister who takes of her and helps her to make medical decision.   They waived interpretor. Both patient and her half sister speak [place].  Menarche 30 years of age. G0P0. No child, no previous pregnancy. Per her sister, patient is not sexually active No prior use of birth control pills or hormone replacement therapy. Denies previous history of biopsy.  02/16/2021, screening mammogram showed calcification in the right breast needs further evaluation.  Left breast possible mass with distortion requires further evaluation. 02/26/2021, left breast showed a 10 mm indeterminate mass, 2:00, 8 cm from nipple.  No mammographic evidence of malignancy in the right breast.  No left axillary lymphadenopathy. 03/10/2021, patient underwent ultrasound-guided biopsy of left breast mass.  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  ER low positive [1-10%], PR positive [1-10%], HER2 positive by IHC 3+  # 04/09/2021, status postlumpectomy and sentinel lymph node biopsy. Invasive mammary carcinoma, no special type, 4 lymph nodes all negative for metastatic carcinoma.   DCIS present.  LVI not identified.Grade 2, all margins negative for invasive carcinoma. pT1c pN0, ER[staining was repeated on surgical specimen] [11-50%]+ PR [1-10%]+, HER 2+  Baseline echocardiogram showed normal LVEF.  INTERVAL HISTORY Anna dung Rennaker is a 45 y.o. female who has above history reviewed by me today presents for follow up visit for chemotherapy for HER2 positive breast cancer  Patient is here by herself today. [place] interpreter online service was used. Reports no new complaints. She tolerates chemotherapy well. No new nausea vomiting diarrhea. + hair loss  Review of Systems  Unable to perform ROS: Other (intellectual/Cognitive impairment)  Constitutional:  Negative for appetite change, chills, fatigue and fever.  HENT:   Negative for hearing loss and voice change.   Eyes:  Negative for eye problems.  Respiratory:  Negative for chest tightness and cough.   Cardiovascular:  Negative for chest pain.  Gastrointestinal:  Negative for abdominal distention, abdominal pain and blood in stool.  Endocrine: Negative for hot flashes.  Genitourinary:  Negative for difficulty urinating and frequency.   Musculoskeletal:  Negative for arthralgias.  Skin:  Negative for itching and rash.       + hair loss  Neurological:  Negative for extremity weakness.  Hematological:  Negative for adenopathy.  Psychiatric/Behavioral:  Negative for confusion.    MEDICAL HISTORY:  Past Medical History:  Diagnosis Date   Down syndrome    Seizure [facility])     SURGICAL HISTORY: Past Surgical History:  Procedure Laterality Date   BREAST BIOPSY Left 03/10/2021   [place] BX,ribbon clip, IMC   BREAST LUMPECTOMY,RADIO FREQ LOCALIZER,AXILLARY SENTINEL LYMPH NODE BIOPSY Left 04/09/2021   Procedure: BREAST LUMPECTOMY,RADIO FREQ LOCALIZER,AXILLARY SENTINEL LYMPH NODE  BIOPSY;  Surgeon: Benjamine Sprague, DO;  Location: [facility] ORS;  Service: General;  Laterality: Left;    SOCIAL HISTORY: Social History    Socioeconomic History   Marital status: Single    Spouse name: Not on file   Number of children: Not on file   Years of education: Not on file   Highest education level: Not on file  Occupational History   Not on file  Tobacco Use   Smoking status: Never   Smokeless tobacco: Never  Vaping Use   Vaping Use: Never used  Substance and Sexual Activity   Alcohol use: Never   Drug use: Not Currently   Sexual activity: Not on file  Other Topics Concern   Not on file  Social History Narrative   Not on file   Social Determinants of Health   Financial Resource Strain: Not on file  Food Insecurity: Not on file  Transportation Needs: Not on file  Physical Activity: Not on file  Stress: Not on file  Social Connections: Not on file  Intimate Partner Violence: Not on file    FAMILY HISTORY: History reviewed. No pertinent family history.  ALLERGIES:  has No Known Allergies.  MEDICATIONS:  Current Outpatient Medications  Medication Sig Dispense Refill   acetaminophen (TYLENOL) 325 MG tablet Take 325-650 mg by mouth every 6 (six) hours as needed for moderate pain.     ethosuximide (ZARONTIN) 250 MG capsule Take 250 mg by mouth 2 (two) times daily.     VITAMIN D PO Take 1 capsule by mouth daily.     HYDROcodone-acetaminophen (NORCO) 5-325 MG tablet Take 1 tablet by mouth every 6 (six) hours as needed for up to 6 doses for moderate pain. (Patient not taking: No sig reported) 6 tablet 0   ibuprofen (ADVIL) 800 MG tablet Take 1 tablet (800 mg total) by mouth every 8 (eight) hours as needed for mild pain or moderate pain. (Patient not taking: No sig reported) 30 tablet 0   ondansetron (ZOFRAN) 8 MG tablet Take 1 tablet (8 mg total) by mouth 2 (two) times daily as needed (Nausea or vomiting). (Patient not taking: No sig reported) 30 tablet 1   No current facility-administered medications for this visit.   Facility-Administered Medications Ordered in Other Visits  Medication Dose Route  Frequency Provider Last Rate Last Admin   sodium chloride flush (NS) 0.9 % injection 10 mL  10 mL Intracatheter PRN Earlie Server, MD         PHYSICAL EXAMINATION: ECOG PERFORMANCE STATUS: 0 - Asymptomatic Vitals:   05/29/21 0844  BP: 114/75  Pulse: 85  Resp: 18  Temp: 97.7 F (36.5 C)   Filed Weights   05/29/21 0844  Weight: 122 lb (55.3 kg)    Physical Exam Constitutional:      General: She is not in acute distress. HENT:     Head: Normocephalic and atraumatic.  Eyes:     General: No scleral icterus. Cardiovascular:     Rate and Rhythm: Normal rate and regular rhythm.     Heart sounds: Normal heart sounds.  Pulmonary:     Effort: Pulmonary effort is normal. No respiratory distress.     Breath sounds: No wheezing.  Abdominal:     General: Bowel sounds are normal. There is no distension.     Palpations: Abdomen is soft.  Musculoskeletal:        General: No deformity. Normal range of motion.     Cervical back: Normal range of motion  and neck supple.  Skin:    General: Skin is warm and dry.     Findings: No erythema or rash.  Neurological:     Mental Status: She is alert. Mental status is at baseline.     Cranial Nerves: No cranial nerve deficit.     Coordination: Coordination normal.  Psychiatric:        Mood and Affect: Mood normal.   .   LABORATORY DATA:  I have reviewed the data as listed Lab Results  Component Value Date   WBC 4.5 05/29/2021   HGB 12.4 05/29/2021   HCT 37.2 05/29/2021   MCV 95.6 05/29/2021   PLT 393 05/29/2021   Recent Labs    05/15/21 0854 05/22/21 0820 05/29/21 0821  NA 135 136 136  K 3.7 3.7 3.7  CL 103 105 101  CO2 25 24 26   GLUCOSE 112* 142* 161*  BUN 11 10 13   CREATININE 0.64 0.56 0.68  CALCIUM 9.3 9.2 9.2  GFRNONAA >60 >60 >60  PROT 7.4 6.9 6.9  ALBUMIN 4.3 3.9 3.9  AST 24 26 28   ALT 25 27 28   ALKPHOS 49 47 48  BILITOT 0.4 0.5 0.4    Iron/TIBC/Ferritin/ %Sat No results found for: IRON, TIBC, FERRITIN,  IRONPCTSAT    RADIOGRAPHIC STUDIES: I have personally reviewed the radiological images as listed and agreed with the findings in the report. ECHOCARDIOGRAM COMPLETE  Result Date: 05/06/2021    ECHOCARDIOGRAM REPORT   Patient Name:   [facility] Date of Exam: 05/06/2021 Medical Rec #:  [id number]   Height:       61.0 in Accession #:    [id number]  Weight:       115.0 lb Date of Birth:  [birth year]   BSA:          1.493 m Patient Age:    60 years    BP:           127/84 mmHg Patient Gender: F           HR:           98 bpm. Exam Location:  [facility] Procedure: 2D Echo, Color Doppler, Cardiac Doppler and Strain Analysis Indications:     Z09 Chemotherapy  History:         Patient has no prior history of Echocardiogram examinations.                  Down's syndrome                  Invasive carcinoma of breast                  Encounter for monitoring cardiotoxic drug therapy.  Sonographer:     Charmayne Sheer Referring Phys:  [id number] [place] Dimmitt Diagnosing Phys: Kate Sable MD  Sonographer Comments: Global longitudinal strain was attempted. IMPRESSIONS  1. Left ventricular ejection fraction, by estimation, is 65 to 70%. The left ventricle has normal function. The left ventricle has no regional wall motion abnormalities. Left ventricular diastolic parameters were normal.  2. Right ventricular systolic function is normal. The right ventricular size is normal.  3. The mitral valve is normal in structure. No evidence of mitral valve regurgitation.  4. The aortic valve is grossly normal. Aortic valve regurgitation is not visualized.  5. The inferior vena cava is normal in size with greater than 50% respiratory variability, suggesting right atrial pressure of 3 mmHg. FINDINGS  Left Ventricle: Left ventricular ejection fraction,  by estimation, is 65 to 70%. The left ventricle has normal function. The left ventricle has no regional wall motion abnormalities. Global longitudinal strain performed but not reported based on interpreter  judgement due to suboptimal tracking. The left ventricular internal cavity size was normal in size. There is no left ventricular hypertrophy. Left ventricular diastolic parameters were normal. Right Ventricle: The right ventricular size is normal. No increase in right ventricular wall thickness. Right ventricular systolic function is normal. Left Atrium: Left atrial size was normal in size. Right Atrium: Right atrial size was normal in size. Pericardium: There is no evidence of pericardial effusion. Mitral Valve: The mitral valve is normal in structure. No evidence of mitral valve regurgitation. MV peak gradient, 3.2 mmHg. The mean mitral valve gradient is 2.0 mmHg. Tricuspid Valve: The tricuspid valve is normal in structure. Tricuspid valve regurgitation is not demonstrated. Aortic Valve: The aortic valve is grossly normal. Aortic valve regurgitation is not visualized. Aortic valve mean gradient measures 5.0 mmHg. Aortic valve peak gradient measures 8.5 mmHg. Aortic valve area, by VTI measures 1.69 cm. Pulmonic Valve: The pulmonic valve was not well visualized. Pulmonic valve regurgitation is not visualized. Aorta: The aortic root is normal in size and structure. Venous: The inferior vena cava is normal in size with greater than 50% respiratory variability, suggesting right atrial pressure of 3 mmHg. IAS/Shunts: No atrial level shunt detected by color flow Doppler.  LEFT VENTRICLE PLAX 2D LVIDd:         3.97 cm  Diastology LVIDs:         2.43 cm  LV e' medial:    8.70 cm/s LV PW:         0.63 cm  LV E/e' medial:  10.5 LV IVS:        0.70 cm  LV e' lateral:   12.60 cm/s LVOT diam:     1.70 cm  LV E/e' lateral: 7.2 LV SV:         44 LV SV Index:   30 LVOT Area:     2.27 cm  RIGHT VENTRICLE RV Basal diam:  2.29 cm RV S prime:     14.30 cm/s LEFT ATRIUM           Index       RIGHT ATRIUM          Index LA diam:      2.40 cm 1.61 cm/m  RA Area:     7.06 cm LA Vol (A4C): 15.1 ml 10.11 ml/m RA Volume:   12.40 ml 8.31  ml/m  AORTIC VALVE                    PULMONIC VALVE AV Area (Vmax):    1.65 cm     PV Vmax:       0.96 m/s AV Area (Vmean):   1.56 cm     PV Vmean:      73.600 cm/s AV Area (VTI):     1.69 cm     PV VTI:        0.191 m AV Vmax:           146.00 cm/s  PV Peak grad:  3.7 mmHg AV Vmean:          101.000 cm/s PV Mean grad:  2.0 mmHg AV VTI:            0.264 m AV Peak Grad:      8.5 mmHg AV Mean Grad:  5.0 mmHg LVOT Vmax:         106.00 cm/s LVOT Vmean:        69.400 cm/s LVOT VTI:          0.196 m LVOT/AV VTI ratio: 0.74  AORTA Ao Root diam: 2.90 cm MITRAL VALVE MV Area (PHT): 3.70 cm    SHUNTS MV Area VTI:   2.00 cm    Systemic VTI:  0.20 m MV Peak grad:  3.2 mmHg    Systemic Diam: 1.70 cm MV Mean grad:  2.0 mmHg MV Vmax:       0.89 m/s MV Vmean:      65.5 cm/s MV Decel Time: 205 msec MV E velocity: 91.30 cm/s MV A velocity: 83.60 cm/s MV E/A ratio:  1.09 Kate Sable MD Electronically signed by Kate Sable MD Signature Date/Time: 05/06/2021/7:01:02 PM    Final       ASSESSMENT & PLAN:  1. Invasive carcinoma of breast ([place])   2. Encounter for antineoplastic chemotherapy   Cancer Staging Invasive carcinoma of breast ([place]) Staging form: Breast, AJCC 8th Edition - Pathologic stage from 04/22/2021: Stage IA (pT1c, pN0, cM0, G2, ER+, PR+, HER2+) - Signed by Earlie Server, MD on 04/22/2021   Cancer Staging Invasive carcinoma of breast [facility]) Staging form: Breast, AJCC 8th Edition - Pathologic stage from 04/22/2021: Stage IA (pT1c, pN0, cM0, G2, ER+, PR+, HER2+) - Signed by Earlie Server, MD on 04/22/2021  #Left invasive carcinoma of breast,pT1c pN0 cM0 HER2 positive ER 11-50% positive,  PR 1-10% positive.   Labs are reviewed and discussed with patient. Proceed with cycle 4 Taxol and transtuzumab.   Supportive care measures are necessary for patient well-being and will be provided as necessary. We spent sufficient time to discuss many aspect of care, questions were answered to patient's  satisfaction.     All questions were answered. The patient knows to call the clinic with any problems questions or concerns.  cc Kirk Ruths, MD   Follow-up in 1 week for lab MD Taxol and trastuzumab treatments. Earlie Server, MD, PhD. 05/29/2021

## 2021-06-05 ENCOUNTER — Inpatient Hospital Stay: Payer: Medicare Other

## 2021-06-05 ENCOUNTER — Encounter: Payer: Self-pay | Admitting: Oncology

## 2021-06-05 ENCOUNTER — Inpatient Hospital Stay (HOSPITAL_BASED_OUTPATIENT_CLINIC_OR_DEPARTMENT_OTHER): Payer: Medicare Other | Admitting: Oncology

## 2021-06-05 VITALS — BP 124/85 | HR 95 | Temp 97.6°F | Resp 16 | Wt 116.0 lb

## 2021-06-05 DIAGNOSIS — E876 Hypokalemia: Secondary | ICD-10-CM | POA: Insufficient documentation

## 2021-06-05 DIAGNOSIS — Z79899 Other long term (current) drug therapy: Secondary | ICD-10-CM | POA: Diagnosis not present

## 2021-06-05 DIAGNOSIS — Z5111 Encounter for antineoplastic chemotherapy: Secondary | ICD-10-CM

## 2021-06-05 DIAGNOSIS — Z5181 Encounter for therapeutic drug level monitoring: Secondary | ICD-10-CM

## 2021-06-05 DIAGNOSIS — Z5112 Encounter for antineoplastic immunotherapy: Secondary | ICD-10-CM | POA: Diagnosis not present

## 2021-06-05 DIAGNOSIS — C50919 Malignant neoplasm of unspecified site of unspecified female breast: Secondary | ICD-10-CM

## 2021-06-05 LAB — COMPREHENSIVE METABOLIC PANEL
ALT: 36 U/L (ref 0–44)
AST: 29 U/L (ref 15–41)
Albumin: 4.2 g/dL (ref 3.5–5.0)
Alkaline Phosphatase: 49 U/L (ref 38–126)
Anion gap: 8 (ref 5–15)
BUN: 13 mg/dL (ref 6–20)
CO2: 25 mmol/L (ref 22–32)
Calcium: 9.2 mg/dL (ref 8.9–10.3)
Chloride: 102 mmol/L (ref 98–111)
Creatinine, Ser: 0.61 mg/dL (ref 0.44–1.00)
GFR, Estimated: >60 mL/min (ref >60)
Glucose, Bld: 135 mg/dL — ABNORMAL HIGH (ref 70–99)
Potassium: 3.4 mmol/L — ABNORMAL LOW (ref 3.5–5.1)
Sodium: 135 mmol/L (ref 135–145)
Total Bilirubin: 0.6 mg/dL (ref 0.3–1.2)
Total Protein: 7.4 g/dL (ref 6.5–8.1)

## 2021-06-05 LAB — CBC WITH DIFFERENTIAL/PLATELET
Abs Immature Granulocytes: 0.03 10*3/uL (ref 0.00–0.07)
Basophils Absolute: 0.1 10*3/uL (ref 0.0–0.1)
Basophils Relative: 1 %
Eosinophils Absolute: 0.2 10*3/uL (ref 0.0–0.5)
Eosinophils Relative: 5 %
HCT: 39.5 % (ref 36.0–46.0)
Hemoglobin: 13.1 g/dL (ref 12.0–15.0)
Immature Granulocytes: 1 %
Lymphocytes Relative: 35 %
Lymphs Abs: 1.7 10*3/uL (ref 0.7–4.0)
MCH: 32 pg (ref 26.0–34.0)
MCHC: 33.2 g/dL (ref 30.0–36.0)
MCV: 96.3 fL (ref 80.0–100.0)
Monocytes Absolute: 0.4 10*3/uL (ref 0.1–1.0)
Monocytes Relative: 9 %
Neutro Abs: 2.4 10*3/uL (ref 1.7–7.7)
Neutrophils Relative %: 49 %
Platelets: 397 10*3/uL (ref 150–400)
RBC: 4.1 MIL/uL (ref 3.87–5.11)
RDW: 12.7 % (ref 11.5–15.5)
WBC: 4.8 10*3/uL (ref 4.0–10.5)
nRBC: 0 % (ref 0.0–0.2)

## 2021-06-05 LAB — PREGNANCY, URINE: Preg Test, Ur: NEGATIVE

## 2021-06-05 MED ORDER — SODIUM CHLORIDE 0.9 % IV SOLN
Freq: Once | INTRAVENOUS | Status: AC
  Filled 2021-06-05: qty 250

## 2021-06-05 MED ORDER — TRASTUZUMAB-DKST CHEMO 150 MG IV SOLR
2.0000 mg/kg | Freq: Once | INTRAVENOUS | Status: AC
  Administered 2021-06-05: 105 mg via INTRAVENOUS
  Filled 2021-06-05: qty 5

## 2021-06-05 MED ORDER — ACETAMINOPHEN 325 MG PO TABS
650.0000 mg | ORAL_TABLET | Freq: Once | ORAL | Status: AC
  Administered 2021-06-05: 650 mg via ORAL
  Filled 2021-06-05: qty 2

## 2021-06-05 MED ORDER — SODIUM CHLORIDE 0.9 % IV SOLN
80.0000 mg/m2 | Freq: Once | INTRAVENOUS | Status: AC
  Administered 2021-06-05: 120 mg via INTRAVENOUS
  Filled 2021-06-05: qty 20

## 2021-06-05 MED ORDER — DIPHENHYDRAMINE HCL 50 MG/ML IJ SOLN
50.0000 mg | Freq: Once | INTRAMUSCULAR | Status: AC
  Administered 2021-06-05: 50 mg via INTRAVENOUS
  Filled 2021-06-05: qty 1

## 2021-06-05 MED ORDER — FAMOTIDINE 20 MG IN NS 100 ML IVPB
20.0000 mg | Freq: Once | INTRAVENOUS | Status: AC
  Administered 2021-06-05: 20 mg via INTRAVENOUS
  Filled 2021-06-05: qty 20

## 2021-06-05 MED ORDER — SODIUM CHLORIDE 0.9 % IV SOLN
10.0000 mg | Freq: Once | INTRAVENOUS | Status: AC
  Administered 2021-06-05: 10 mg via INTRAVENOUS
  Filled 2021-06-05: qty 10

## 2021-06-05 NOTE — Patient Instructions (Signed)
[facility]  Discharge Instructions: Thank you for choosing [facility] to provide your oncology and hematology care.  If you have a lab appointment with the [facility], please go directly to the [facility] and check in at the registration area.  Wear comfortable clothing and clothing appropriate for easy access to any Portacath or PICC line.   We strive to give you quality time with your provider. You may need to reschedule your appointment if you arrive late (15 or more minutes).  Arriving late affects you and other patients whose appointments are after yours.  Also, if you miss three or more appointments without notifying the office, you may be dismissed from the clinic at the provider's discretion.      For prescription refill requests, have your pharmacy contact our office and allow 72 hours for refills to be completed.    Today you received the following chemotherapy and/or immunotherapy agents : Herceptin : Taxol    To help prevent nausea and vomiting after your treatment, we encourage you to take your nausea medication as directed.  BELOW ARE SYMPTOMS THAT SHOULD BE REPORTED IMMEDIATELY: *FEVER GREATER THAN 100.4 F (38 C) OR HIGHER *CHILLS OR SWEATING *NAUSEA AND VOMITING THAT IS NOT CONTROLLED WITH YOUR NAUSEA MEDICATION *UNUSUAL SHORTNESS OF BREATH *UNUSUAL BRUISING OR BLEEDING *URINARY PROBLEMS (pain or burning when urinating, or frequent urination) *BOWEL PROBLEMS (unusual diarrhea, constipation, pain near the anus) TENDERNESS IN MOUTH AND THROAT WITH OR WITHOUT PRESENCE OF ULCERS (sore throat, sores in mouth, or a toothache) UNUSUAL RASH, SWELLING OR PAIN  UNUSUAL VAGINAL DISCHARGE OR ITCHING   Items with * indicate a potential emergency and should be followed up as soon as possible or go to the Emergency Department if any problems should occur.  Please show the CHEMOTHERAPY ALERT CARD or IMMUNOTHERAPY ALERT CARD at  check-in to the Emergency Department and triage nurse.  Should you have questions after your visit or need to cancel or reschedule your appointment, please contact [facility]  [phone] and follow the prompts.  Office hours are 8:00 a.m. to 4:30 p.m. Monday - Friday. Please note that voicemails left after 4:00 p.m. may not be returned until the following business day.  We are closed weekends and major holidays. You have access to a nurse at all times for urgent questions. Please call the main number to the clinic [phone] and follow the prompts.  For any non-urgent questions, you may also contact your provider using MyChart. We now offer e-Visits for anyone 19 and older to request care online for non-urgent symptoms. For details visit [email or web address].   Also download the MyChart app! Go to the app store, search "MyChart", open the app, select [facility], and log in with your MyChart username and password.  Due to Covid, a mask is required upon entering the hospital/clinic. If you do not have a mask, one will be given to you upon arrival. For doctor visits, patients may have 1 support person aged 55 or older with them. For treatment visits, patients cannot have anyone with them due to current Covid guidelines and our immunocompromised population.

## 2021-06-05 NOTE — Progress Notes (Signed)
Patient here for oncology follow-up appointment, expresses no complaints or concerns at this time.    

## 2021-06-05 NOTE — Progress Notes (Signed)
Hematology/Oncology follow up  note [facility] Telephone:([phone] Fax:([phone]   Patient Care Team: Kirk Ruths, MD as PCP - General (Internal Medicine)  REFERRING PROVIDER: Kirk Ruths, MD  CHIEF COMPLAINTS/REASON FOR VISIT:  Follow up for treatment of breast cancer  HISTORY OF PRESENTING ILLNESS:   Anna Banks is a  45 y.o.  female with PMH listed below was seen in consultation at the request of  Kirk Ruths, MD  for evaluation of breast cancer.   Patient was accompanied by her half sister today. Per sister, patient has history of seizure since childhood and has intellectual/cognitive impairment. She is illiterate. She follows instructions from her family members.  She lives with her mother and her half sister who takes of her and helps her to make medical decision.   They waived interpretor. Both patient and her half sister speak [place].  Menarche 24 years of age. G0P0. No child, no previous pregnancy. Per her sister, patient is not sexually active No prior use of birth control pills or hormone replacement therapy. Denies previous history of biopsy.  02/16/2021, screening mammogram showed calcification in the right breast needs further evaluation.  Left breast possible mass with distortion requires further evaluation. 02/26/2021, left breast showed a 10 mm indeterminate mass, 2:00, 8 cm from nipple.  No mammographic evidence of malignancy in the right breast.  No left axillary lymphadenopathy. 03/10/2021, patient underwent ultrasound-guided biopsy of left breast mass.  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  ER low positive [1-10%], PR positive [1-10%], HER2 positive by IHC 3+  # 04/09/2021, status postlumpectomy and sentinel lymph node biopsy. Invasive mammary carcinoma, no special type, 4 lymph nodes all negative for metastatic carcinoma.   DCIS present.  LVI not identified.Grade 2, all margins negative for invasive carcinoma. pT1c pN0, ER[staining was repeated on surgical specimen] [11-50%]+ PR [1-10%]+, HER 2+  Baseline echocardiogram showed normal LVEF.  INTERVAL HISTORY Anna Banks is a 45 y.o. female who has above history reviewed by me today presents for follow up visit for chemotherapy for HER2 positive breast cancer  Patient is here by herself today. [place] interpreter online service was used. No new complaints. No new nausea vomiting diarrhea. + hair loss  Review of Systems  Unable to perform ROS: Other (intellectual/Cognitive impairment)  Constitutional:  Negative for appetite change, chills, fatigue and fever.  HENT:   Negative for hearing loss and voice change.   Eyes:  Negative for eye problems.  Respiratory:  Negative for chest tightness and cough.   Cardiovascular:  Negative for chest pain.  Gastrointestinal:  Negative for abdominal distention, abdominal pain and blood in stool.  Endocrine: Negative for hot flashes.  Genitourinary:  Negative for difficulty urinating and frequency.   Musculoskeletal:  Negative for arthralgias.  Skin:  Negative for itching and rash.       + hair loss  Neurological:  Negative for extremity weakness.  Hematological:  Negative for adenopathy.  Psychiatric/Behavioral:  Negative for confusion.    MEDICAL HISTORY:  Past Medical History:  Diagnosis Date   Down syndrome    Seizure [facility])     SURGICAL HISTORY: Past Surgical History:  Procedure Laterality Date   BREAST BIOPSY Left 03/10/2021   [place] BX,ribbon clip, [place]   BREAST LUMPECTOMY,RADIO FREQ LOCALIZER,AXILLARY SENTINEL LYMPH NODE BIOPSY Left 04/09/2021   Procedure: BREAST LUMPECTOMY,RADIO FREQ LOCALIZER,AXILLARY SENTINEL LYMPH NODE BIOPSY;  Surgeon: Benjamine Sprague,  DO;  Location: [facility] ORS;  Service: General;  Laterality: Left;    SOCIAL HISTORY: Social History   Socioeconomic History   Marital status: Single     Spouse name: Not on file   Number of children: Not on file   Years of education: Not on file   Highest education level: Not on file  Occupational History   Not on file  Tobacco Use   Smoking status: Never   Smokeless tobacco: Never  Vaping Use   Vaping Use: Never used  Substance and Sexual Activity   Alcohol use: Never   Drug use: Not Currently   Sexual activity: Not on file  Other Topics Concern   Not on file  Social History Narrative   Not on file   Social Determinants of Health   Financial Resource Strain: Not on file  Food Insecurity: Not on file  Transportation Needs: Not on file  Physical Activity: Not on file  Stress: Not on file  Social Connections: Not on file  Intimate Partner Violence: Not on file    FAMILY HISTORY: History reviewed. No pertinent family history.  ALLERGIES:  has No Known Allergies.  MEDICATIONS:  Current Outpatient Medications  Medication Sig Dispense Refill   acetaminophen (TYLENOL) 325 MG tablet Take 325-650 mg by mouth every 6 (six) hours as needed for moderate pain.     ethosuximide (ZARONTIN) 250 MG capsule Take 250 mg by mouth 2 (two) times daily.     VITAMIN D PO Take 1 capsule by mouth daily.     HYDROcodone-acetaminophen (NORCO) 5-325 MG tablet Take 1 tablet by mouth every 6 (six) hours as needed for up to 6 doses for moderate pain. (Patient not taking: No sig reported) 6 tablet 0   ibuprofen (ADVIL) 800 MG tablet Take 1 tablet (800 mg total) by mouth every 8 (eight) hours as needed for mild pain or moderate pain. (Patient not taking: No sig reported) 30 tablet 0   ondansetron (ZOFRAN) 8 MG tablet Take 1 tablet (8 mg total) by mouth 2 (two) times daily as needed (Nausea or vomiting). (Patient not taking: No sig reported) 30 tablet 1   No current facility-administered medications for this visit.     PHYSICAL EXAMINATION: ECOG PERFORMANCE STATUS: 0 - Asymptomatic Vitals:   06/05/21 0852  BP: 124/85  Pulse: 95  Resp: 16  Temp:  97.6 F (36.4 C)  SpO2: 100%   Filed Weights   06/05/21 0852  Weight: 116 lb (52.6 kg)    Physical Exam Constitutional:      General: She is not in acute distress. HENT:     Head: Normocephalic and atraumatic.  Eyes:     General: No scleral icterus. Cardiovascular:     Rate and Rhythm: Normal rate and regular rhythm.     Heart sounds: Normal heart sounds.  Pulmonary:     Effort: Pulmonary effort is normal. No respiratory distress.     Breath sounds: No wheezing.  Abdominal:     General: Bowel sounds are normal. There is no distension.     Palpations: Abdomen is soft.  Musculoskeletal:        General: No deformity. Normal range of motion.     Cervical back: Normal range of motion and neck supple.  Skin:    General: Skin is warm and dry.     Findings: No erythema or rash.  Neurological:     Mental Status: She is alert. Mental status is at baseline.  Cranial Nerves: No cranial nerve deficit.     Coordination: Coordination normal.  Psychiatric:        Mood and Affect: Mood normal.   .   LABORATORY DATA:  I have reviewed the data as listed Lab Results  Component Value Date   WBC 4.8 06/05/2021   HGB 13.1 06/05/2021   HCT 39.5 06/05/2021   MCV 96.3 06/05/2021   PLT 397 06/05/2021   Recent Labs    05/22/21 0820 05/29/21 0821 06/05/21 0828  NA 136 136 135  K 3.7 3.7 3.4*  CL 105 101 102  CO2 24 26 25   GLUCOSE 142* 161* 135*  BUN 10 13 13   CREATININE 0.56 0.68 0.61  CALCIUM 9.2 9.2 9.2  GFRNONAA >60 >60 >60  PROT 6.9 6.9 7.4  ALBUMIN 3.9 3.9 4.2  AST 26 28 29   ALT 27 28 36  ALKPHOS 47 48 49  BILITOT 0.5 0.4 0.6    Iron/TIBC/Ferritin/ %Sat No results found for: IRON, TIBC, FERRITIN, IRONPCTSAT    RADIOGRAPHIC STUDIES: I have personally reviewed the radiological images as listed and agreed with the findings in the report. ECHOCARDIOGRAM COMPLETE  Result Date: 05/06/2021    ECHOCARDIOGRAM REPORT   Patient Name:   [facility] [place] Date of Exam:  05/06/2021 Medical Rec #:  [id number]   Height:       61.0 in Accession #:    [id number]  Weight:       115.0 lb Date of Birth:  06-22-1976   BSA:          1.493 m Patient Age:    88 years    BP:           127/84 mmHg Patient Gender: F           HR:           98 bpm. Exam Location:  [facility] Procedure: 2D Echo, Color Doppler, Cardiac Doppler and Strain Analysis Indications:     Z09 Chemotherapy  History:         Patient has no prior history of Echocardiogram examinations.                  Down's syndrome                  Invasive carcinoma of breast                  Encounter for monitoring cardiotoxic drug therapy.  Sonographer:     Charmayne Sheer Referring Phys:  [id number] Korrie Hofbauer Diagnosing Phys: Kate Sable MD  Sonographer Comments: Global longitudinal strain was attempted. IMPRESSIONS  1. Left ventricular ejection fraction, by estimation, is 65 to 70%. The left ventricle has normal function. The left ventricle has no regional wall motion abnormalities. Left ventricular diastolic parameters were normal.  2. Right ventricular systolic function is normal. The right ventricular size is normal.  3. The mitral valve is normal in structure. No evidence of mitral valve regurgitation.  4. The aortic valve is grossly normal. Aortic valve regurgitation is not visualized.  5. The inferior vena cava is normal in size with greater than 50% respiratory variability, suggesting right atrial pressure of 3 mmHg. FINDINGS  Left Ventricle: Left ventricular ejection fraction, by estimation, is 65 to 70%. The left ventricle has normal function. The left ventricle has no regional wall motion abnormalities. Global longitudinal strain performed but not reported based on interpreter judgement due to suboptimal tracking. The left ventricular internal cavity size was  normal in size. There is no left ventricular hypertrophy. Left ventricular diastolic parameters were normal. Right Ventricle: The right ventricular size is normal. No increase in  right ventricular wall thickness. Right ventricular systolic function is normal. Left Atrium: Left atrial size was normal in size. Right Atrium: Right atrial size was normal in size. Pericardium: There is no evidence of pericardial effusion. Mitral Valve: The mitral valve is normal in structure. No evidence of mitral valve regurgitation. MV peak gradient, 3.2 mmHg. The mean mitral valve gradient is 2.0 mmHg. Tricuspid Valve: The tricuspid valve is normal in structure. Tricuspid valve regurgitation is not demonstrated. Aortic Valve: The aortic valve is grossly normal. Aortic valve regurgitation is not visualized. Aortic valve mean gradient measures 5.0 mmHg. Aortic valve peak gradient measures 8.5 mmHg. Aortic valve area, by VTI measures 1.69 cm. Pulmonic Valve: The pulmonic valve was not well visualized. Pulmonic valve regurgitation is not visualized. Aorta: The aortic root is normal in size and structure. Venous: The inferior vena cava is normal in size with greater than 50% respiratory variability, suggesting right atrial pressure of 3 mmHg. IAS/Shunts: No atrial level shunt detected by color flow Doppler.  LEFT VENTRICLE PLAX 2D LVIDd:         3.97 cm  Diastology LVIDs:         2.43 cm  LV e' medial:    8.70 cm/s LV PW:         0.63 cm  LV E/e' medial:  10.5 LV IVS:        0.70 cm  LV e' lateral:   12.60 cm/s LVOT diam:     1.70 cm  LV E/e' lateral: 7.2 LV SV:         44 LV SV Index:   30 LVOT Area:     2.27 cm  RIGHT VENTRICLE RV Basal diam:  2.29 cm RV S prime:     14.30 cm/s LEFT ATRIUM           Index       RIGHT ATRIUM          Index LA diam:      2.40 cm 1.61 cm/m  RA Area:     7.06 cm LA Vol (A4C): 15.1 ml 10.11 ml/m RA Volume:   12.40 ml 8.31 ml/m  AORTIC VALVE                    PULMONIC VALVE AV Area (Vmax):    1.65 cm     PV Vmax:       0.96 m/s AV Area (Vmean):   1.56 cm     PV Vmean:      73.600 cm/s AV Area (VTI):     1.69 cm     PV VTI:        0.191 m AV Vmax:           146.00 cm/s  PV  Peak grad:  3.7 mmHg AV Vmean:          101.000 cm/s PV Mean grad:  2.0 mmHg AV VTI:            0.264 m AV Peak Grad:      8.5 mmHg AV Mean Grad:      5.0 mmHg LVOT Vmax:         106.00 cm/s LVOT Vmean:        69.400 cm/s LVOT VTI:          0.196 m LVOT/AV VTI  ratio: 0.74  AORTA Ao Root diam: 2.90 cm MITRAL VALVE MV Area (PHT): 3.70 cm    SHUNTS MV Area VTI:   2.00 cm    Systemic VTI:  0.20 m MV Peak grad:  3.2 mmHg    Systemic Diam: 1.70 cm MV Mean grad:  2.0 mmHg MV Vmax:       0.89 m/s MV Vmean:      65.5 cm/s MV Decel Time: 205 msec MV E velocity: 91.30 cm/s MV A velocity: 83.60 cm/s MV E/A ratio:  1.09 Kate Sable MD Electronically signed by Kate Sable MD Signature Date/Time: 05/06/2021/7:01:02 PM    Final       ASSESSMENT & PLAN:  1. Invasive carcinoma of breast ([place])   2. Encounter for monitoring cardiotoxic drug therapy   3. Encounter for antineoplastic chemotherapy   4. Hypokalemia   Cancer Staging Invasive carcinoma of breast ([place]) Staging form: Breast, AJCC 8th Edition - Pathologic stage from 04/22/2021: Stage IA (pT1c, pN0, cM0, G2, ER+, PR+, HER2+) - Signed by Earlie Server, MD on 04/22/2021   Cancer Staging Invasive carcinoma of breast [facility]) Staging form: Breast, AJCC 8th Edition - Pathologic stage from 04/22/2021: Stage IA (pT1c, pN0, cM0, G2, ER+, PR+, HER2+) - Signed by Earlie Server, MD on 04/22/2021  #Left invasive carcinoma of breast,pT1c pN0 cM0 HER2 positive ER 11-50% positive,  PR 1-10% positive.   Labs are reviewed and discussed with patient. Proceed with cycle 5 Taxol and Transtuzumab  Hypokalemia, mild. K is 3.4. recommend potassium enriched food.  We spent sufficient time to discuss many aspect of care, questions were answered to patient's satisfaction.     All questions were answered. The patient knows to call the clinic with any problems questions or concerns.  cc Kirk Ruths, MD   Follow-up in 1 week for lab MD Taxol and trastuzumab  treatments. Earlie Server, MD, PhD. 06/05/2021

## 2021-06-15 ENCOUNTER — Inpatient Hospital Stay (HOSPITAL_BASED_OUTPATIENT_CLINIC_OR_DEPARTMENT_OTHER): Payer: Medicare Other | Admitting: Oncology

## 2021-06-15 ENCOUNTER — Encounter: Payer: Self-pay | Admitting: Oncology

## 2021-06-15 ENCOUNTER — Inpatient Hospital Stay: Payer: Medicare Other

## 2021-06-15 ENCOUNTER — Inpatient Hospital Stay: Payer: Medicare Other | Attending: Oncology

## 2021-06-15 VITALS — BP 110/71

## 2021-06-15 VITALS — BP 132/83 | HR 92 | Temp 99.0°F | Resp 18 | Wt 118.0 lb

## 2021-06-15 DIAGNOSIS — R000 Tachycardia, unspecified: Secondary | ICD-10-CM | POA: Insufficient documentation

## 2021-06-15 DIAGNOSIS — E876 Hypokalemia: Secondary | ICD-10-CM | POA: Insufficient documentation

## 2021-06-15 DIAGNOSIS — Z5111 Encounter for antineoplastic chemotherapy: Secondary | ICD-10-CM | POA: Insufficient documentation

## 2021-06-15 DIAGNOSIS — C50919 Malignant neoplasm of unspecified site of unspecified female breast: Secondary | ICD-10-CM

## 2021-06-15 DIAGNOSIS — Z170 Estrogen receptor positive status [ER+]: Secondary | ICD-10-CM | POA: Insufficient documentation

## 2021-06-15 DIAGNOSIS — C50412 Malignant neoplasm of upper-outer quadrant of left female breast: Secondary | ICD-10-CM | POA: Diagnosis present

## 2021-06-15 DIAGNOSIS — R4189 Other symptoms and signs involving cognitive functions and awareness: Secondary | ICD-10-CM | POA: Diagnosis not present

## 2021-06-15 DIAGNOSIS — Z5112 Encounter for antineoplastic immunotherapy: Secondary | ICD-10-CM | POA: Diagnosis not present

## 2021-06-15 LAB — CBC WITH DIFFERENTIAL/PLATELET
Abs Immature Granulocytes: 0.03 10*3/uL (ref 0.00–0.07)
Basophils Absolute: 0 10*3/uL (ref 0.0–0.1)
Basophils Relative: 1 %
Eosinophils Absolute: 0.3 10*3/uL (ref 0.0–0.5)
Eosinophils Relative: 6 %
HCT: 35.5 % — ABNORMAL LOW (ref 36.0–46.0)
Hemoglobin: 11.7 g/dL — ABNORMAL LOW (ref 12.0–15.0)
Immature Granulocytes: 1 %
Lymphocytes Relative: 33 %
Lymphs Abs: 1.5 10*3/uL (ref 0.7–4.0)
MCH: 32.1 pg (ref 26.0–34.0)
MCHC: 33 g/dL (ref 30.0–36.0)
MCV: 97.5 fL (ref 80.0–100.0)
Monocytes Absolute: 0.5 10*3/uL (ref 0.1–1.0)
Monocytes Relative: 11 %
Neutro Abs: 2.2 10*3/uL (ref 1.7–7.7)
Neutrophils Relative %: 48 %
Platelets: 355 10*3/uL (ref 150–400)
RBC: 3.64 MIL/uL — ABNORMAL LOW (ref 3.87–5.11)
RDW: 12.9 % (ref 11.5–15.5)
WBC: 4.6 10*3/uL (ref 4.0–10.5)
nRBC: 0 % (ref 0.0–0.2)

## 2021-06-15 LAB — COMPREHENSIVE METABOLIC PANEL
ALT: 25 U/L (ref 0–44)
AST: 23 U/L (ref 15–41)
Albumin: 3.9 g/dL (ref 3.5–5.0)
Alkaline Phosphatase: 46 U/L (ref 38–126)
Anion gap: 6 (ref 5–15)
BUN: 14 mg/dL (ref 6–20)
CO2: 25 mmol/L (ref 22–32)
Calcium: 8.9 mg/dL (ref 8.9–10.3)
Chloride: 104 mmol/L (ref 98–111)
Creatinine, Ser: 0.64 mg/dL (ref 0.44–1.00)
GFR, Estimated: >60 mL/min (ref >60)
Glucose, Bld: 135 mg/dL — ABNORMAL HIGH (ref 70–99)
Potassium: 3.4 mmol/L — ABNORMAL LOW (ref 3.5–5.1)
Sodium: 135 mmol/L (ref 135–145)
Total Bilirubin: 0.4 mg/dL (ref 0.3–1.2)
Total Protein: 7 g/dL (ref 6.5–8.1)

## 2021-06-15 LAB — PREGNANCY, URINE: Preg Test, Ur: NEGATIVE

## 2021-06-15 MED ORDER — POTASSIUM CHLORIDE CRYS ER 10 MEQ PO TBCR: 10.0000 meq | EXTENDED_RELEASE_TABLET | Freq: Every day | ORAL | 0 refills | Status: DC

## 2021-06-15 MED ORDER — SODIUM CHLORIDE 0.9 % IV SOLN
80.0000 mg/m2 | Freq: Once | INTRAVENOUS | Status: AC
  Administered 2021-06-15: 120 mg via INTRAVENOUS
  Filled 2021-06-15: qty 20

## 2021-06-15 MED ORDER — SODIUM CHLORIDE 0.9 % IV SOLN
Freq: Once | INTRAVENOUS | Status: AC
  Filled 2021-06-15: qty 250

## 2021-06-15 MED ORDER — FAMOTIDINE 20 MG IN NS 100 ML IVPB
20.0000 mg | Freq: Once | INTRAVENOUS | Status: AC
  Administered 2021-06-15: 20 mg via INTRAVENOUS
  Filled 2021-06-15: qty 20

## 2021-06-15 MED ORDER — DIPHENHYDRAMINE HCL 50 MG/ML IJ SOLN
50.0000 mg | Freq: Once | INTRAMUSCULAR | Status: AC
  Administered 2021-06-15: 50 mg via INTRAVENOUS
  Filled 2021-06-15: qty 1

## 2021-06-15 MED ORDER — SODIUM CHLORIDE 0.9 % IV SOLN
10.0000 mg | Freq: Once | INTRAVENOUS | Status: AC
  Administered 2021-06-15: 10 mg via INTRAVENOUS
  Filled 2021-06-15: qty 10

## 2021-06-15 MED ORDER — ACETAMINOPHEN 325 MG PO TABS
650.0000 mg | ORAL_TABLET | Freq: Once | ORAL | Status: AC
  Administered 2021-06-15: 650 mg via ORAL
  Filled 2021-06-15: qty 2

## 2021-06-15 MED ORDER — TRASTUZUMAB-DKST CHEMO 150 MG IV SOLR
2.0000 mg/kg | Freq: Once | INTRAVENOUS | Status: AC
  Administered 2021-06-15: 105 mg via INTRAVENOUS
  Filled 2021-06-15: qty 5

## 2021-06-15 NOTE — Progress Notes (Signed)
Hematology/Oncology follow up  note [facility] Telephone:(336) [phone] Fax:(336) [phone]   Patient Care Team: Kirk Ruths, MD as PCP - General (Internal Medicine)  REFERRING PROVIDER: Kirk Ruths, MD  CHIEF COMPLAINTS/REASON FOR VISIT:  Follow up for treatment of breast cancer  HISTORY OF PRESENTING ILLNESS:   Anna Banks is a  45 y.o.  female with PMH listed below was seen in consultation at the request of  Kirk Ruths, MD  for evaluation of breast cancer.   Patient was accompanied by her half sister today. Per sister, patient has history of seizure since childhood and has intellectual/cognitive impairment. She is illiterate. She follows instructions from her family members.  She lives with her mother and her half sister who takes of her and helps her to make medical decision.   They waived interpretor. Both patient and her half sister speak [place].  Menarche 46 years of age. G0P0. No child, no previous pregnancy. Per her sister, patient is not sexually active No prior use of birth control pills or hormone replacement therapy. Denies previous history of biopsy.  02/16/2021, screening mammogram showed calcification in the right breast needs further evaluation.  Left breast possible mass with distortion requires further evaluation. 02/26/2021, left breast showed a 10 mm indeterminate mass, 2:00, 8 cm from nipple.  No mammographic evidence of malignancy in the right breast.  No left axillary lymphadenopathy. 03/10/2021, patient underwent ultrasound-guided biopsy of left breast mass.  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  ER low positive [1-10%], PR positive [1-10%], HER2 positive by IHC 3+  # 04/09/2021, status postlumpectomy and sentinel lymph node biopsy. Invasive mammary carcinoma, no special type, 4 lymph nodes all negative for metastatic carcinoma.   DCIS present.  LVI not identified.Grade 2, all margins negative for invasive carcinoma. pT1c pN0, ER[staining was repeated on surgical specimen] [11-50%]+ PR [1-10%]+, HER 2+  Baseline echocardiogram showed normal LVEF.  INTERVAL HISTORY Anna Banks is a 45 y.o. female who has above history reviewed by me today presents for follow up visit for chemotherapy for HER2 positive breast cancer  Patient is here by herself today. [place] interpreter online service was used. No new complaints. No new nausea vomiting diarrhea. + hair loss  Review of Systems  Unable to perform ROS: Other (intellectual/Cognitive impairment)  Constitutional:  Negative for appetite change, chills, fatigue and fever.  HENT:   Negative for hearing loss and voice change.   Eyes:  Negative for eye problems.  Respiratory:  Negative for chest tightness and cough.   Cardiovascular:  Negative for chest pain.  Gastrointestinal:  Negative for abdominal distention, abdominal pain and blood in stool.  Endocrine: Negative for hot flashes.  Genitourinary:  Negative for difficulty urinating and frequency.   Musculoskeletal:  Negative for arthralgias.  Skin:  Negative for itching and rash.       + hair loss  Neurological:  Negative for extremity weakness.  Hematological:  Negative for adenopathy.  Psychiatric/Behavioral:  Negative for confusion.    MEDICAL HISTORY:  Past Medical History:  Diagnosis Date   Down syndrome    Seizure [facility])     SURGICAL HISTORY: Past Surgical History:  Procedure Laterality Date   BREAST BIOPSY Left 03/10/2021   [place] BX,ribbon clip, [place]   BREAST LUMPECTOMY,RADIO FREQ LOCALIZER,AXILLARY SENTINEL LYMPH NODE BIOPSY Left 04/09/2021   Procedure: BREAST LUMPECTOMY,RADIO FREQ LOCALIZER,AXILLARY SENTINEL LYMPH NODE BIOPSY;  Surgeon: Benjamine Sprague,  DO;  Location: [facility] ORS;  Service: General;  Laterality: Left;    SOCIAL HISTORY: Social History   Socioeconomic History   Marital status: Single     Spouse name: Not on file   Number of children: Not on file   Years of education: Not on file   Highest education level: Not on file  Occupational History   Not on file  Tobacco Use   Smoking status: Never   Smokeless tobacco: Never  Vaping Use   Vaping Use: Never used  Substance and Sexual Activity   Alcohol use: Never   Drug use: Not Currently   Sexual activity: Not on file  Other Topics Concern   Not on file  Social History Narrative   Not on file   Social Determinants of Health   Financial Resource Strain: Not on file  Food Insecurity: Not on file  Transportation Needs: Not on file  Physical Activity: Not on file  Stress: Not on file  Social Connections: Not on file  Intimate Partner Violence: Not on file    FAMILY HISTORY: History reviewed. No pertinent family history.  ALLERGIES:  has No Known Allergies.  MEDICATIONS:  Current Outpatient Medications  Medication Sig Dispense Refill   acetaminophen (TYLENOL) 325 MG tablet Take 325-650 mg by mouth every 6 (six) hours as needed for moderate pain.     ethosuximide (ZARONTIN) 250 MG capsule Take 250 mg by mouth 2 (two) times daily.     ibuprofen (ADVIL) 800 MG tablet Take 1 tablet (800 mg total) by mouth every 8 (eight) hours as needed for mild pain or moderate pain. 30 tablet 0   potassium chloride (KLOR-CON) 10 MEQ tablet Take 1 tablet (10 mEq total) by mouth daily. 30 tablet 0   HYDROcodone-acetaminophen (NORCO) 5-325 MG tablet Take 1 tablet by mouth every 6 (six) hours as needed for up to 6 doses for moderate pain. (Patient not taking: No sig reported) 6 tablet 0   ondansetron (ZOFRAN) 8 MG tablet Take 1 tablet (8 mg total) by mouth 2 (two) times daily as needed (Nausea or vomiting). (Patient not taking: No sig reported) 30 tablet 1   VITAMIN D PO Take 1 capsule by mouth daily.     No current facility-administered medications for this visit.     PHYSICAL EXAMINATION: ECOG PERFORMANCE STATUS: 0 -  Asymptomatic Vitals:   06/15/21 0907  BP: 132/83  Pulse: 92  Resp: 18  Temp: 99 F (37.2 C)  SpO2: 100%   Filed Weights   06/15/21 0907  Weight: 118 lb (53.5 kg)    Physical Exam Constitutional:      General: She is not in acute distress. HENT:     Head: Normocephalic and atraumatic.  Eyes:     General: No scleral icterus. Cardiovascular:     Rate and Rhythm: Normal rate and regular rhythm.     Heart sounds: Normal heart sounds.  Pulmonary:     Effort: Pulmonary effort is normal. No respiratory distress.     Breath sounds: No wheezing.  Abdominal:     General: Bowel sounds are normal. There is no distension.     Palpations: Abdomen is soft.  Musculoskeletal:        General: No deformity. Normal range of motion.     Cervical back: Normal range of motion and neck supple.  Skin:    General: Skin is warm and dry.     Findings: No erythema or rash.  Neurological:  Mental Status: She is alert. Mental status is at baseline.     Cranial Nerves: No cranial nerve deficit.     Coordination: Coordination normal.  Psychiatric:        Mood and Affect: Mood normal.   .   LABORATORY DATA:  I have reviewed the data as listed Lab Results  Component Value Date   WBC 4.6 06/15/2021   HGB 11.7 (L) 06/15/2021   HCT 35.5 (L) 06/15/2021   MCV 97.5 06/15/2021   PLT 355 06/15/2021   Recent Labs    05/29/21 0821 06/05/21 0828 06/15/21 0827  NA 136 135 135  K 3.7 3.4* 3.4*  CL 101 102 104  CO2 26 25 25   GLUCOSE 161* 135* 135*  BUN 13 13 14   CREATININE 0.68 0.61 0.64  CALCIUM 9.2 9.2 8.9  GFRNONAA >60 >60 >60  PROT 6.9 7.4 7.0  ALBUMIN 3.9 4.2 3.9  AST 28 29 23   ALT 28 36 25  ALKPHOS 48 49 46  BILITOT 0.4 0.6 0.4    Iron/TIBC/Ferritin/ %Sat No results found for: IRON, TIBC, FERRITIN, IRONPCTSAT    RADIOGRAPHIC STUDIES: I have personally reviewed the radiological images as listed and agreed with the findings in the report. No results found.    ASSESSMENT  & PLAN:  1. Invasive carcinoma of breast ([place])   2. Encounter for antineoplastic chemotherapy   3. Hypokalemia   Cancer Staging Invasive carcinoma of breast ([place]) Staging form: Breast, AJCC 8th Edition - Pathologic stage from 04/22/2021: Stage IA (pT1c, pN0, cM0, G2, ER+, PR+, HER2+) - Signed by Earlie Server, MD on 04/22/2021   Cancer Staging Invasive carcinoma of breast [facility]) Staging form: Breast, AJCC 8th Edition - Pathologic stage from 04/22/2021: Stage IA (pT1c, pN0, cM0, G2, ER+, PR+, HER2+) - Signed by Earlie Server, MD on 04/22/2021  #Left invasive carcinoma of breast,pT1c pN0 cM0 HER2 positive ER 11-50% positive,  PR 1-10% positive.   Labs are reviewed and discussed with patient. Proceed with cycle 6 Taxol and Transtuzumab   Hypokalemia, mild. K is 3.4. recommend patient to start oral potassium chloride 24mq daily.  We spent sufficient time to discuss many aspect of care, questions were answered to patient's satisfaction.     All questions were answered. The patient knows to call the clinic with any problems questions or concerns.  cc AKirk Ruths MD   Follow-up in 1 week for lab MD Taxol and trastuzumab treatments. ZEarlie Server MD, PhD. 06/15/2021

## 2021-06-15 NOTE — Progress Notes (Signed)
Patient doing well. Offers no complaints today. No side effects from chemo accept alopecia.

## 2021-06-22 ENCOUNTER — Inpatient Hospital Stay: Payer: Medicare Other

## 2021-06-22 ENCOUNTER — Other Ambulatory Visit: Payer: Self-pay

## 2021-06-22 ENCOUNTER — Encounter: Payer: Self-pay | Admitting: Oncology

## 2021-06-22 ENCOUNTER — Inpatient Hospital Stay (HOSPITAL_BASED_OUTPATIENT_CLINIC_OR_DEPARTMENT_OTHER): Payer: Medicare Other | Admitting: Oncology

## 2021-06-22 VITALS — BP 114/86 | HR 100 | Temp 98.4°F | Resp 16 | Wt 118.0 lb

## 2021-06-22 DIAGNOSIS — C50919 Malignant neoplasm of unspecified site of unspecified female breast: Secondary | ICD-10-CM

## 2021-06-22 DIAGNOSIS — Z5111 Encounter for antineoplastic chemotherapy: Secondary | ICD-10-CM

## 2021-06-22 DIAGNOSIS — Z5112 Encounter for antineoplastic immunotherapy: Secondary | ICD-10-CM | POA: Diagnosis not present

## 2021-06-22 DIAGNOSIS — E876 Hypokalemia: Secondary | ICD-10-CM | POA: Diagnosis not present

## 2021-06-22 LAB — COMPREHENSIVE METABOLIC PANEL
ALT: 26 U/L (ref 0–44)
AST: 28 U/L (ref 15–41)
Albumin: 3.9 g/dL (ref 3.5–5.0)
Alkaline Phosphatase: 49 U/L (ref 38–126)
Anion gap: 5 (ref 5–15)
BUN: 8 mg/dL (ref 6–20)
CO2: 25 mmol/L (ref 22–32)
Calcium: 9 mg/dL (ref 8.9–10.3)
Chloride: 105 mmol/L (ref 98–111)
Creatinine, Ser: 0.68 mg/dL (ref 0.44–1.00)
GFR, Estimated: >60 mL/min (ref >60)
Glucose, Bld: 147 mg/dL — ABNORMAL HIGH (ref 70–99)
Potassium: 3.9 mmol/L (ref 3.5–5.1)
Sodium: 135 mmol/L (ref 135–145)
Total Bilirubin: 0.3 mg/dL (ref 0.3–1.2)
Total Protein: 7 g/dL (ref 6.5–8.1)

## 2021-06-22 LAB — CBC WITH DIFFERENTIAL/PLATELET
Abs Immature Granulocytes: 0.03 10*3/uL (ref 0.00–0.07)
Basophils Absolute: 0.1 10*3/uL (ref 0.0–0.1)
Basophils Relative: 1 %
Eosinophils Absolute: 0.4 10*3/uL (ref 0.0–0.5)
Eosinophils Relative: 8 %
HCT: 35.2 % — ABNORMAL LOW (ref 36.0–46.0)
Hemoglobin: 11.8 g/dL — ABNORMAL LOW (ref 12.0–15.0)
Immature Granulocytes: 1 %
Lymphocytes Relative: 34 %
Lymphs Abs: 1.6 10*3/uL (ref 0.7–4.0)
MCH: 32.4 pg (ref 26.0–34.0)
MCHC: 33.5 g/dL (ref 30.0–36.0)
MCV: 96.7 fL (ref 80.0–100.0)
Monocytes Absolute: 0.4 10*3/uL (ref 0.1–1.0)
Monocytes Relative: 7 %
Neutro Abs: 2.4 10*3/uL (ref 1.7–7.7)
Neutrophils Relative %: 49 %
Platelets: 339 10*3/uL (ref 150–400)
RBC: 3.64 MIL/uL — ABNORMAL LOW (ref 3.87–5.11)
RDW: 12.6 % (ref 11.5–15.5)
WBC: 4.8 10*3/uL (ref 4.0–10.5)
nRBC: 0 % (ref 0.0–0.2)

## 2021-06-22 MED ORDER — FAMOTIDINE 20 MG IN NS 100 ML IVPB
20.0000 mg | Freq: Once | INTRAVENOUS | Status: AC
  Administered 2021-06-22: 20 mg via INTRAVENOUS
  Filled 2021-06-22: qty 20

## 2021-06-22 MED ORDER — TRASTUZUMAB-DKST CHEMO 150 MG IV SOLR
2.0000 mg/kg | Freq: Once | INTRAVENOUS | Status: AC
  Administered 2021-06-22: 105 mg via INTRAVENOUS
  Filled 2021-06-22: qty 5

## 2021-06-22 MED ORDER — SODIUM CHLORIDE 0.9 % IV SOLN
80.0000 mg/m2 | Freq: Once | INTRAVENOUS | Status: AC
  Administered 2021-06-22: 120 mg via INTRAVENOUS
  Filled 2021-06-22: qty 20

## 2021-06-22 MED ORDER — SODIUM CHLORIDE 0.9 % IV SOLN
Freq: Once | INTRAVENOUS | Status: AC
  Filled 2021-06-22: qty 250

## 2021-06-22 MED ORDER — DIPHENHYDRAMINE HCL 50 MG/ML IJ SOLN
50.0000 mg | Freq: Once | INTRAMUSCULAR | Status: AC
  Administered 2021-06-22: 50 mg via INTRAVENOUS
  Filled 2021-06-22: qty 1

## 2021-06-22 MED ORDER — ACETAMINOPHEN 325 MG PO TABS
650.0000 mg | ORAL_TABLET | Freq: Once | ORAL | Status: AC
  Administered 2021-06-22: 650 mg via ORAL
  Filled 2021-06-22: qty 2

## 2021-06-22 MED ORDER — SODIUM CHLORIDE 0.9 % IV SOLN
10.0000 mg | Freq: Once | INTRAVENOUS | Status: AC
  Administered 2021-06-22: 10 mg via INTRAVENOUS
  Filled 2021-06-22: qty 10

## 2021-06-22 NOTE — Progress Notes (Signed)
Hematology/Oncology follow up  note [facility] Telephone:([phone] Fax:(336) [phone]   Patient Care Team: Kirk Ruths, MD as PCP - General (Internal Medicine)  REFERRING PROVIDER: Kirk Ruths, MD  CHIEF COMPLAINTS/REASON FOR VISIT:  Follow up for treatment of breast cancer  HISTORY OF PRESENTING ILLNESS:   Anna Banks is a  45 y.o.  female with PMH listed below was seen in consultation at the request of  Kirk Ruths, MD  for evaluation of breast cancer.   Patient was accompanied by her half sister today. Per sister, patient has history of seizure since childhood and has intellectual/cognitive impairment. She is illiterate. She follows instructions from her family members.  She lives with her mother and her half sister who takes of her and helps her to make medical decision.   They waived interpretor. Both patient and her half sister speak [place].  Menarche 89 years of age. G0P0. No child, no previous pregnancy. Per her sister, patient is not sexually active No prior use of birth control pills or hormone replacement therapy. Denies previous history of biopsy.  02/16/2021, screening mammogram showed calcification in the right breast needs further evaluation.  Left breast possible mass with distortion requires further evaluation. 02/26/2021, left breast showed a 10 mm indeterminate mass, 2:00, 8 cm from nipple.  No mammographic evidence of malignancy in the right breast.  No left axillary lymphadenopathy. 03/10/2021, patient underwent ultrasound-guided biopsy of left breast mass.  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  ER low positive [1-10%], PR positive [1-10%], HER2 positive by IHC 3+  # 04/09/2021, status postlumpectomy and sentinel lymph node biopsy. Invasive mammary carcinoma, no special type, 4 lymph nodes all negative for metastatic carcinoma.   DCIS present.  LVI not identified.Grade 2, all margins negative for invasive carcinoma. pT1c pN0, ER[staining was repeated on surgical specimen] [11-50%]+ PR [1-10%]+, HER 2+  Baseline echocardiogram showed normal LVEF.  INTERVAL HISTORY Anna Banks is a 45 y.o. female who has above history reviewed by me today presents for follow up visit for chemotherapy for HER2 positive breast cancer  Patient is here by herself today. [place] interpreter online service was used. + hair loss + rash on her scalp, non itchy.  No nausea vomiting, numbness tingling.   Review of Systems  Unable to perform ROS: Other (intellectual/Cognitive impairment)  Constitutional:  Negative for appetite change, chills, fatigue and fever.  HENT:   Negative for hearing loss and voice change.   Eyes:  Negative for eye problems.  Respiratory:  Negative for chest tightness and cough.   Cardiovascular:  Negative for chest pain.  Gastrointestinal:  Negative for abdominal distention, abdominal pain and blood in stool.  Endocrine: Negative for hot flashes.  Genitourinary:  Negative for difficulty urinating and frequency.   Musculoskeletal:  Negative for arthralgias.  Skin:  Positive for rash. Negative for itching.       + hair loss  Neurological:  Negative for extremity weakness.  Hematological:  Negative for adenopathy.  Psychiatric/Behavioral:  Negative for confusion.    MEDICAL HISTORY:  Past Medical History:  Diagnosis Date   Down syndrome    Seizure [facility])     SURGICAL HISTORY: Past Surgical History:  Procedure Laterality Date   BREAST BIOPSY Left 03/10/2021   [place] BX,ribbon clip, [place]   BREAST LUMPECTOMY,RADIO FREQ LOCALIZER,AXILLARY SENTINEL LYMPH NODE BIOPSY Left 04/09/2021   Procedure: BREAST LUMPECTOMY,RADIO [place]  LYMPH NODE BIOPSY;  Surgeon: Benjamine Sprague, DO;  Location: [facility] ORS;  Service: General;  Laterality: Left;    SOCIAL HISTORY: Social History   Socioeconomic  History   Marital status: Single    Spouse name: Not on file   Number of children: Not on file   Years of education: Not on file   Highest education level: Not on file  Occupational History   Not on file  Tobacco Use   Smoking status: Never   Smokeless tobacco: Never  Vaping Use   Vaping Use: Never used  Substance and Sexual Activity   Alcohol use: Never   Drug use: Not Currently   Sexual activity: Not on file  Other Topics Concern   Not on file  Social History Narrative   Not on file   Social Determinants of Health   Financial Resource Strain: Not on file  Food Insecurity: Not on file  Transportation Needs: Not on file  Physical Activity: Not on file  Stress: Not on file  Social Connections: Not on file  Intimate Partner Violence: Not on file    FAMILY HISTORY: History reviewed. No pertinent family history.  ALLERGIES:  has No Known Allergies.  MEDICATIONS:  Current Outpatient Medications  Medication Sig Dispense Refill   acetaminophen (TYLENOL) 325 MG tablet Take 325-650 mg by mouth every 6 (six) hours as needed for moderate pain.     ethosuximide (ZARONTIN) 250 MG capsule Take 250 mg by mouth 2 (two) times daily.     potassium chloride (KLOR-CON) 10 MEQ tablet Take 1 tablet (10 mEq total) by mouth daily. 30 tablet 0   VITAMIN D PO Take 1 capsule by mouth daily.     HYDROcodone-acetaminophen (NORCO) 5-325 MG tablet Take 1 tablet by mouth every 6 (six) hours as needed for up to 6 doses for moderate pain. (Patient not taking: No sig reported) 6 tablet 0   ibuprofen (ADVIL) 800 MG tablet Take 1 tablet (800 mg total) by mouth every 8 (eight) hours as needed for mild pain or moderate pain. (Patient not taking: Reported on 06/22/2021) 30 tablet 0   ondansetron (ZOFRAN) 8 MG tablet Take 1 tablet (8 mg total) by mouth 2 (two) times daily as needed (Nausea or vomiting). (Patient not taking: No sig reported) 30 tablet 1   No current facility-administered medications for this  visit.   Facility-Administered Medications Ordered in Other Visits  Medication Dose Route Frequency Provider Last Rate Last Admin   PACLitaxel (TAXOL) 120 mg in sodium chloride 0.9 % 250 mL chemo infusion (</= 13m/m2)  80 mg/m2 (Order-Specific) Intravenous Once YEarlie Server MD 270 mL/hr at 06/22/21 1129 120 mg at 06/22/21 1129     PHYSICAL EXAMINATION: ECOG PERFORMANCE STATUS: 0 - Asymptomatic Vitals:   06/22/21 0839  BP: 114/86  Pulse: 100  Resp: 16  Temp: 98.4 F (36.9 C)  SpO2: 100%   Filed Weights   06/22/21 0933  Weight: 118 lb (53.5 kg)    Physical Exam Constitutional:      General: She is not in acute distress. HENT:     Head: Normocephalic and atraumatic.  Eyes:     General: No scleral icterus. Cardiovascular:     Rate and Rhythm: Normal rate and regular rhythm.     Heart sounds: Normal heart sounds.  Pulmonary:     Effort: Pulmonary effort is normal. No respiratory distress.     Breath sounds: No wheezing.  Abdominal:     General: Bowel sounds are normal.  There is no distension.     Palpations: Abdomen is soft.  Musculoskeletal:        General: No deformity. Normal range of motion.     Cervical back: Normal range of motion and neck supple.  Skin:    General: Skin is warm and dry.     Findings: No erythema or rash.  Neurological:     Mental Status: She is alert. Mental status is at baseline.     Cranial Nerves: No cranial nerve deficit.     Coordination: Coordination normal.  Psychiatric:        Mood and Affect: Mood normal.   .   LABORATORY DATA:  I have reviewed the data as listed Lab Results  Component Value Date   WBC 4.8 06/22/2021   HGB 11.8 (L) 06/22/2021   HCT 35.2 (L) 06/22/2021   MCV 96.7 06/22/2021   PLT 339 06/22/2021   Recent Labs    06/05/21 0828 06/15/21 0827 06/22/21 0823  NA 135 135 135  K 3.4* 3.4* 3.9  CL 102 104 105  CO2 _0 GLUCOSE 135* 135* 147*  BUN _1 CREATININE 0.61 0.64 0.68  CALCIUM 9.2 8.9 9.0   GFRNONAA >60 >60 >60  PROT 7.4 7.0 7.0  ALBUMIN 4.2 3.9 3.9  AST _2 ALT 36 25 26  ALKPHOS 49 46 49  BILITOT 0.6 0.4 0.3    Iron/TIBC/Ferritin/ %Sat No results found for: IRON, TIBC, FERRITIN, IRONPCTSAT    RADIOGRAPHIC STUDIES: I have personally reviewed the radiological images as listed and agreed with the findings in the report. No results found.    ASSESSMENT & PLAN:  1. Invasive carcinoma of breast ([place])   2. Encounter for antineoplastic chemotherapy   3. Hypokalemia   Cancer Staging Invasive carcinoma of breast ([place]) Staging form: Breast, AJCC 8th Edition - Pathologic stage from 04/22/2021: Stage IA (pT1c, pN0, cM0, G2, ER+, PR+, HER2+) - Signed by Earlie Server, MD on 04/22/2021   Cancer Staging Invasive carcinoma of breast [facility]) Staging form: Breast, AJCC 8th Edition - Pathologic stage from 04/22/2021: Stage IA (pT1c, pN0, cM0, G2, ER+, PR+, HER2+) - Signed by Earlie Server, MD on 04/22/2021  #Left invasive carcinoma of breast,pT1c pN0 cM0 HER2 positive ER 11-50% positive,  PR 1-10% positive.   Labs are reviewed and discussed with patient. Proceed with cycle 7 Taxol and Transtuzumab.   Hypokalemia, mild. K is 3.9. continue oral potassium chloride 51mq daily.  We spent sufficient time to discuss many aspect of care, questions were answered to patient's satisfaction.     All questions were answered. The patient knows to call the clinic with any problems questions or concerns.  cc AKirk Ruths MD   Follow-up in 1 week for lab MD Taxol and trastuzumab treatments. ZEarlie Server MD, PhD. 06/22/2021

## 2021-06-22 NOTE — Patient Instructions (Signed)
[facility]  Discharge Instructions: Thank you for choosing [facility] to provide your oncology and hematology care.  If you have a lab appointment with the [facility], please go directly to the [facility] and check in at the registration area.  Wear comfortable clothing and clothing appropriate for easy access to any Portacath or PICC line.   We strive to give you quality time with your provider. You may need to reschedule your appointment if you arrive late (15 or more minutes).  Arriving late affects you and other patients whose appointments are after yours.  Also, if you miss three or more appointments without notifying the office, you may be dismissed from the clinic at the provider's discretion.      For prescription refill requests, have your pharmacy contact our office and allow 72 hours for refills to be completed.    Today you received the following chemotherapy and/or immunotherapy agents [place] and Taxol       To help prevent nausea and vomiting after your treatment, we encourage you to take your nausea medication as directed.  BELOW ARE SYMPTOMS THAT SHOULD BE REPORTED IMMEDIATELY: *FEVER GREATER THAN 100.4 F (38 C) OR HIGHER *CHILLS OR SWEATING *NAUSEA AND VOMITING THAT IS NOT CONTROLLED WITH YOUR NAUSEA MEDICATION *UNUSUAL SHORTNESS OF BREATH *UNUSUAL BRUISING OR BLEEDING *URINARY PROBLEMS (pain or burning when urinating, or frequent urination) *BOWEL PROBLEMS (unusual diarrhea, constipation, pain near the anus) TENDERNESS IN MOUTH AND THROAT WITH OR WITHOUT PRESENCE OF ULCERS (sore throat, sores in mouth, or a toothache) UNUSUAL RASH, SWELLING OR PAIN  UNUSUAL VAGINAL DISCHARGE OR ITCHING   Items with * indicate a potential emergency and should be followed up as soon as possible or go to the Emergency Department if any problems should occur.  Please show the CHEMOTHERAPY ALERT CARD or IMMUNOTHERAPY ALERT CARD at  check-in to the Emergency Department and triage nurse.  Should you have questions after your visit or need to cancel or reschedule your appointment, please contact [facility]  [phone] and follow the prompts.  Office hours are 8:00 a.m. to 4:30 p.m. Monday - Friday. Please note that voicemails left after 4:00 p.m. may not be returned until the following business day.  We are closed weekends and major holidays. You have access to a nurse at all times for urgent questions. Please call the main number to the clinic [phone] and follow the prompts.  For any non-urgent questions, you may also contact your provider using MyChart. We now offer e-Visits for anyone 33 and older to request care online for non-urgent symptoms. For details visit [email or web address].   Also download the MyChart app! Go to the app store, search "MyChart", open the app, select [facility], and log in with your MyChart username and password.  Due to Covid, a mask is required upon entering the hospital/clinic. If you do not have a mask, one will be given to you upon arrival. For doctor visits, patients may have 1 support person aged 60 or older with them. For treatment visits, patients cannot have anyone with them due to current Covid guidelines and our immunocompromised population.

## 2021-06-22 NOTE — Progress Notes (Signed)
Follow up visit. No concerns. Doing well.

## 2021-06-29 ENCOUNTER — Inpatient Hospital Stay (HOSPITAL_BASED_OUTPATIENT_CLINIC_OR_DEPARTMENT_OTHER): Payer: Medicare Other | Admitting: Oncology

## 2021-06-29 ENCOUNTER — Encounter: Payer: Self-pay | Admitting: Oncology

## 2021-06-29 ENCOUNTER — Other Ambulatory Visit: Payer: Self-pay

## 2021-06-29 ENCOUNTER — Inpatient Hospital Stay: Payer: Medicare Other

## 2021-06-29 VITALS — BP 125/75 | HR 91 | Temp 97.2°F | Wt 118.6 lb

## 2021-06-29 DIAGNOSIS — E876 Hypokalemia: Secondary | ICD-10-CM

## 2021-06-29 DIAGNOSIS — C50919 Malignant neoplasm of unspecified site of unspecified female breast: Secondary | ICD-10-CM | POA: Diagnosis not present

## 2021-06-29 DIAGNOSIS — Z5111 Encounter for antineoplastic chemotherapy: Secondary | ICD-10-CM

## 2021-06-29 DIAGNOSIS — Z5112 Encounter for antineoplastic immunotherapy: Secondary | ICD-10-CM | POA: Diagnosis not present

## 2021-06-29 LAB — CBC WITH DIFFERENTIAL/PLATELET
Abs Immature Granulocytes: 0.03 10*3/uL (ref 0.00–0.07)
Basophils Absolute: 0.1 10*3/uL (ref 0.0–0.1)
Basophils Relative: 1 %
Eosinophils Absolute: 0.2 10*3/uL (ref 0.0–0.5)
Eosinophils Relative: 5 %
HCT: 35.1 % — ABNORMAL LOW (ref 36.0–46.0)
Hemoglobin: 11.6 g/dL — ABNORMAL LOW (ref 12.0–15.0)
Immature Granulocytes: 1 %
Lymphocytes Relative: 34 %
Lymphs Abs: 1.6 10*3/uL (ref 0.7–4.0)
MCH: 32 pg (ref 26.0–34.0)
MCHC: 33 g/dL (ref 30.0–36.0)
MCV: 96.7 fL (ref 80.0–100.0)
Monocytes Absolute: 0.4 10*3/uL (ref 0.1–1.0)
Monocytes Relative: 7 %
Neutro Abs: 2.4 10*3/uL (ref 1.7–7.7)
Neutrophils Relative %: 52 %
Platelets: 369 10*3/uL (ref 150–400)
RBC: 3.63 MIL/uL — ABNORMAL LOW (ref 3.87–5.11)
RDW: 12.8 % (ref 11.5–15.5)
WBC: 4.7 10*3/uL (ref 4.0–10.5)
nRBC: 0 % (ref 0.0–0.2)

## 2021-06-29 LAB — COMPREHENSIVE METABOLIC PANEL
ALT: 27 U/L (ref 0–44)
AST: 25 U/L (ref 15–41)
Albumin: 3.9 g/dL (ref 3.5–5.0)
Alkaline Phosphatase: 47 U/L (ref 38–126)
Anion gap: 6 (ref 5–15)
BUN: 12 mg/dL (ref 6–20)
CO2: 26 mmol/L (ref 22–32)
Calcium: 9.3 mg/dL (ref 8.9–10.3)
Chloride: 103 mmol/L (ref 98–111)
Creatinine, Ser: 0.67 mg/dL (ref 0.44–1.00)
GFR, Estimated: >60 mL/min (ref >60)
Glucose, Bld: 143 mg/dL — ABNORMAL HIGH (ref 70–99)
Potassium: 3.5 mmol/L (ref 3.5–5.1)
Sodium: 135 mmol/L (ref 135–145)
Total Bilirubin: 0.2 mg/dL — ABNORMAL LOW (ref 0.3–1.2)
Total Protein: 6.9 g/dL (ref 6.5–8.1)

## 2021-06-29 LAB — PREGNANCY, URINE: Preg Test, Ur: NEGATIVE

## 2021-06-29 MED ORDER — SODIUM CHLORIDE 0.9 % IV SOLN
Freq: Once | INTRAVENOUS | Status: AC
  Filled 2021-06-29: qty 250

## 2021-06-29 MED ORDER — DIPHENHYDRAMINE HCL 50 MG/ML IJ SOLN
50.0000 mg | Freq: Once | INTRAMUSCULAR | Status: AC
  Administered 2021-06-29: 50 mg via INTRAVENOUS
  Filled 2021-06-29: qty 1

## 2021-06-29 MED ORDER — FAMOTIDINE 20 MG IN NS 100 ML IVPB
20.0000 mg | Freq: Once | INTRAVENOUS | Status: AC
  Administered 2021-06-29: 20 mg via INTRAVENOUS
  Filled 2021-06-29: qty 20

## 2021-06-29 MED ORDER — ACETAMINOPHEN 325 MG PO TABS
650.0000 mg | ORAL_TABLET | Freq: Once | ORAL | Status: AC
  Administered 2021-06-29: 650 mg via ORAL
  Filled 2021-06-29: qty 2

## 2021-06-29 MED ORDER — SODIUM CHLORIDE 0.9 % IV SOLN
80.0000 mg/m2 | Freq: Once | INTRAVENOUS | Status: AC
  Administered 2021-06-29: 120 mg via INTRAVENOUS
  Filled 2021-06-29: qty 20

## 2021-06-29 MED ORDER — TRASTUZUMAB-DKST CHEMO 150 MG IV SOLR
2.0000 mg/kg | Freq: Once | INTRAVENOUS | Status: AC
  Administered 2021-06-29: 105 mg via INTRAVENOUS
  Filled 2021-06-29: qty 5

## 2021-06-29 MED ORDER — SODIUM CHLORIDE 0.9 % IV SOLN
10.0000 mg | Freq: Once | INTRAVENOUS | Status: AC
  Administered 2021-06-29: 10 mg via INTRAVENOUS
  Filled 2021-06-29: qty 10

## 2021-06-29 NOTE — Progress Notes (Signed)
Hematology/Oncology follow up  note [facility] Telephone:(336) [phone] Fax:(336) [phone]   Patient Care Team: Kirk Ruths, MD as PCP - General (Internal Medicine)  REFERRING PROVIDER: Kirk Ruths, MD  CHIEF COMPLAINTS/REASON FOR VISIT:  Follow up for treatment of breast cancer  HISTORY OF PRESENTING ILLNESS:   Anna Banks is a  45 y.o.  female with PMH listed below was seen in consultation at the request of  Kirk Ruths, MD  for evaluation of breast cancer.   Patient was accompanied by her half sister today. Per sister, patient has history of seizure since childhood and has intellectual/cognitive impairment. She is illiterate. She follows instructions from her family members.  She lives with her mother and her half sister who takes of her and helps her to make medical decision.   They waived interpretor. Both patient and her half sister speak [place].  Menarche 63 years of age. G0P0. No child, no previous pregnancy. Per her sister, patient is not sexually active No prior use of birth control pills or hormone replacement therapy. Denies previous history of biopsy.  02/16/2021, screening mammogram showed calcification in the right breast needs further evaluation.  Left breast possible mass with distortion requires further evaluation. 02/26/2021, left breast showed a 10 mm indeterminate mass, 2:00, 8 cm from nipple.  No mammographic evidence of malignancy in the right breast.  No left axillary lymphadenopathy. 03/10/2021, patient underwent ultrasound-guided biopsy of left breast mass.  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  ER low positive [1-10%], PR positive [1-10%], HER2 positive by IHC 3+  # 04/09/2021, status postlumpectomy and sentinel lymph node biopsy. Invasive mammary carcinoma, no special type, 4 lymph nodes all negative for metastatic carcinoma.   DCIS present.  LVI not identified.Grade 2, all margins negative for invasive carcinoma. pT1c pN0, ER[staining was repeated on surgical specimen] [11-50%]+ PR [1-10%]+, HER 2+  Baseline echocardiogram showed normal LVEF.  INTERVAL HISTORY Anna Banks is a 45 y.o. female who has above history reviewed by me today presents for follow up visit for chemotherapy for HER2 positive breast cancer  Patient is here by herself today. [place] interpreter online service was used. + hair loss + rash on her scalp, non itchy.  Patient denies any nausea vomiting diarrhea.  She tolerates treatment well.  No new complaints.  Review of Systems  Unable to perform ROS: Other (intellectual/Cognitive impairment)  Constitutional:  Negative for appetite change, chills, fatigue and fever.  HENT:   Negative for hearing loss and voice change.   Eyes:  Negative for eye problems.  Respiratory:  Negative for chest tightness and cough.   Cardiovascular:  Negative for chest pain.  Gastrointestinal:  Negative for abdominal distention, abdominal pain and blood in stool.  Endocrine: Negative for hot flashes.  Genitourinary:  Negative for difficulty urinating and frequency.   Musculoskeletal:  Negative for arthralgias.  Skin:  Positive for rash. Negative for itching.       + hair loss  Neurological:  Negative for extremity weakness.  Hematological:  Negative for adenopathy.  Psychiatric/Behavioral:  Negative for confusion.    MEDICAL HISTORY:  Past Medical History:  Diagnosis Date   Down syndrome    Seizure [facility])     SURGICAL HISTORY: Past Surgical History:  Procedure Laterality Date   BREAST BIOPSY Left 03/10/2021   [place] BX,ribbon clip, [facility]   BREAST LUMPECTOMY,RADIO FREQ LOCALIZER,AXILLARY SENTINEL LYMPH NODE BIOPSY Left  04/09/2021   Procedure: BREAST LUMPECTOMY,RADIO FREQ LOCALIZER,AXILLARY SENTINEL LYMPH NODE BIOPSY;  Surgeon: Benjamine Sprague, DO;  Location: [facility] ORS;  Service: General;  Laterality: Left;     SOCIAL HISTORY: Social History   Socioeconomic History   Marital status: Single    Spouse name: Not on file   Number of children: Not on file   Years of education: Not on file   Highest education level: Not on file  Occupational History   Not on file  Tobacco Use   Smoking status: Never   Smokeless tobacco: Never  Vaping Use   Vaping Use: Never used  Substance and Sexual Activity   Alcohol use: Never   Drug use: Not Currently   Sexual activity: Not on file  Other Topics Concern   Not on file  Social History Narrative   Not on file   Social Determinants of Health   Financial Resource Strain: Not on file  Food Insecurity: Not on file  Transportation Needs: Not on file  Physical Activity: Not on file  Stress: Not on file  Social Connections: Not on file  Intimate Partner Violence: Not on file    FAMILY HISTORY: History reviewed. No pertinent family history.  ALLERGIES:  has No Known Allergies.  MEDICATIONS:  Current Outpatient Medications  Medication Sig Dispense Refill   acetaminophen (TYLENOL) 325 MG tablet Take 325-650 mg by mouth every 6 (six) hours as needed for moderate pain.     ethosuximide (ZARONTIN) 250 MG capsule Take 250 mg by mouth 2 (two) times daily.     HYDROcodone-acetaminophen (NORCO) 5-325 MG tablet Take 1 tablet by mouth every 6 (six) hours as needed for up to 6 doses for moderate pain. (Patient not taking: No sig reported) 6 tablet 0   ibuprofen (ADVIL) 800 MG tablet Take 1 tablet (800 mg total) by mouth every 8 (eight) hours as needed for mild pain or moderate pain. (Patient not taking: No sig reported) 30 tablet 0   ondansetron (ZOFRAN) 8 MG tablet Take 1 tablet (8 mg total) by mouth 2 (two) times daily as needed (Nausea or vomiting). (Patient not taking: No sig reported) 30 tablet 1   potassium chloride (KLOR-CON) 10 MEQ tablet Take 1 tablet (10 mEq total) by mouth daily. 30 tablet 0   VITAMIN D PO Take 1 capsule by mouth daily.     No  current facility-administered medications for this visit.     PHYSICAL EXAMINATION: ECOG PERFORMANCE STATUS: 0 - Asymptomatic Vitals:   06/29/21 0940  BP: 125/75  Pulse: 91  Temp: (!) 97.2 F (36.2 C)   Filed Weights   06/29/21 0940  Weight: 118 lb 9.6 oz (53.8 kg)    Physical Exam Constitutional:      General: She is not in acute distress. HENT:     Head: Normocephalic and atraumatic.  Eyes:     General: No scleral icterus. Cardiovascular:     Rate and Rhythm: Normal rate and regular rhythm.     Heart sounds: Normal heart sounds.  Pulmonary:     Effort: Pulmonary effort is normal. No respiratory distress.     Breath sounds: No wheezing.  Abdominal:     General: Bowel sounds are normal. There is no distension.     Palpations: Abdomen is soft.  Musculoskeletal:        General: No deformity. Normal range of motion.     Cervical back: Normal range of motion and neck supple.  Skin:    General: Skin  is warm and dry.     Findings: No erythema or rash.  Neurological:     Mental Status: She is alert. Mental status is at baseline.     Cranial Nerves: No cranial nerve deficit.     Coordination: Coordination normal.  Psychiatric:        Mood and Affect: Mood normal.   .   LABORATORY DATA:  I have reviewed the data as listed Lab Results  Component Value Date   WBC 4.7 06/29/2021   HGB 11.6 (L) 06/29/2021   HCT 35.1 (L) 06/29/2021   MCV 96.7 06/29/2021   PLT 369 06/29/2021   Recent Labs    06/15/21 0827 06/22/21 0823 06/29/21 0826  NA 135 135 135  K 3.4* 3.9 3.5  CL 104 105 103  CO2 _0 GLUCOSE 135* 147* 143*  BUN _1 CREATININE 0.64 0.68 0.67  CALCIUM 8.9 9.0 9.3  GFRNONAA >60 >60 >60  PROT 7.0 7.0 6.9  ALBUMIN 3.9 3.9 3.9  AST _2 ALT _3 ALKPHOS 46 49 47  BILITOT 0.4 0.3 0.2*    Iron/TIBC/Ferritin/ %Sat No results found for: IRON, TIBC, FERRITIN, IRONPCTSAT    RADIOGRAPHIC STUDIES: I have personally reviewed the  radiological images as listed and agreed with the findings in the report. No results found.    ASSESSMENT & PLAN:  1. Encounter for antineoplastic chemotherapy   2. Invasive carcinoma of breast ([place])   3. Hypokalemia   Cancer Staging Invasive carcinoma of breast ([place]) Staging form: Breast, AJCC 8th Edition - Pathologic stage from 04/22/2021: Stage IA (pT1c, pN0, cM0, G2, ER+, PR+, HER2+) - Signed by Earlie Server, MD on 04/22/2021   Cancer Staging Invasive carcinoma of breast [facility]) Staging form: Breast, AJCC 8th Edition - Pathologic stage from 04/22/2021: Stage IA (pT1c, pN0, cM0, G2, ER+, PR+, HER2+) - Signed by Earlie Server, MD on 04/22/2021  #Left invasive carcinoma of breast,pT1c pN0 cM0 HER2 positive ER 11-50% positive,  PR 1-10% positive.   Labs reviewed and discussed with patient Proceed with cycle 8 Taxol and trastuzumab  Hypokalemia, mild. K is 3.5. continue oral potassium chloride 13mq daily.  We spent sufficient time to discuss many aspect of care, questions were answered to patient's satisfaction.     All questions were answered. The patient knows to call the clinic with any problems questions or concerns.  cc AKirk Ruths MD   Follow-up in 1 week for lab MD Taxol and trastuzumab treatments. ZEarlie Server MD, PhD. 06/29/2021

## 2021-06-29 NOTE — Patient Instructions (Signed)
[facility]  Discharge Instructions: Thank you for choosing [facility] to provide your oncology and hematology care.  If you have a lab appointment with the [facility], please go directly to the [facility] and check in at the registration area.  Wear comfortable clothing and clothing appropriate for easy access to any Portacath or PICC line.   We strive to give you quality time with your provider. You may need to reschedule your appointment if you arrive late (15 or more minutes).  Arriving late affects you and other patients whose appointments are after yours.  Also, if you miss three or more appointments without notifying the office, you may be dismissed from the clinic at the provider's discretion.      For prescription refill requests, have your pharmacy contact our office and allow 72 hours for refills to be completed.    Today you received the following chemotherapy and/or immunotherapy agents: Ogivri, Taxol      To help prevent nausea and vomiting after your treatment, we encourage you to take your nausea medication as directed.  BELOW ARE SYMPTOMS THAT SHOULD BE REPORTED IMMEDIATELY: *FEVER GREATER THAN 100.4 F (38 C) OR HIGHER *CHILLS OR SWEATING *NAUSEA AND VOMITING THAT IS NOT CONTROLLED WITH YOUR NAUSEA MEDICATION *UNUSUAL SHORTNESS OF BREATH *UNUSUAL BRUISING OR BLEEDING *URINARY PROBLEMS (pain or burning when urinating, or frequent urination) *BOWEL PROBLEMS (unusual diarrhea, constipation, pain near the anus) TENDERNESS IN MOUTH AND THROAT WITH OR WITHOUT PRESENCE OF ULCERS (sore throat, sores in mouth, or a toothache) UNUSUAL RASH, SWELLING OR PAIN  UNUSUAL VAGINAL DISCHARGE OR ITCHING   Items with * indicate a potential emergency and should be followed up as soon as possible or go to the Emergency Department if any problems should occur.  Please show the CHEMOTHERAPY ALERT CARD or IMMUNOTHERAPY ALERT CARD at  check-in to the Emergency Department and triage nurse.  Should you have questions after your visit or need to cancel or reschedule your appointment, please contact [facility]  [phone] and follow the prompts.  Office hours are 8:00 a.m. to 4:30 p.m. Monday - Friday. Please note that voicemails left after 4:00 p.m. may not be returned until the following business day.  We are closed weekends and major holidays. You have access to a nurse at all times for urgent questions. Please call the main number to the clinic [phone] and follow the prompts.  For any non-urgent questions, you may also contact your provider using MyChart. We now offer e-Visits for anyone 46 and older to request care online for non-urgent symptoms. For details visit [email or web address].   Also download the MyChart app! Go to the app store, search "MyChart", open the app, select [facility], and log in with your MyChart username and password.  Due to Covid, a mask is required upon entering the hospital/clinic. If you do not have a mask, one will be given to you upon arrival. For doctor visits, patients may have 1 support person aged 49 or older with them. For treatment visits, patients cannot have anyone with them due to current Covid guidelines and our immunocompromised population. Paclitaxel injection What is this medication? PACLITAXEL (PAK li TAX el) is a chemotherapy drug. It targets fast dividing cells, like cancer cells, and causes these cells to die. This medicine is used to treat ovarian cancer, breast cancer, lung cancer, Kaposi's sarcoma, and other cancers. This medicine may be used for other purposes; ask your  health care provider or pharmacist if you have questions. COMMON BRAND NAME(S): Onxol, Taxol What should I tell my care team before I take this medication? They need to know if you have any of these conditions: history of irregular heartbeat liver disease low blood  counts, like low white cell, platelet, or red cell counts lung or breathing disease, like asthma tingling of the fingers or toes, or other nerve disorder an unusual or allergic reaction to paclitaxel, alcohol, polyoxyethylated castor oil, other chemotherapy, other medicines, foods, dyes, or preservatives pregnant or trying to get pregnant breast-feeding How should I use this medication? This drug is given as an infusion into a vein. It is administered in a hospital or clinic by a specially trained health care professional. Talk to your pediatrician regarding the use of this medicine in children. Special care may be needed. Overdosage: If you think you have taken too much of this medicine contact a poison control center or emergency room at once. NOTE: This medicine is only for you. Do not share this medicine with others. What if I miss a dose? It is important not to miss your dose. Call your doctor or health care professional if you are unable to keep an appointment. What may interact with this medication? Do not take this medicine with any of the following medications: live virus vaccines This medicine may also interact with the following medications: antiviral medicines for hepatitis, HIV or AIDS certain antibiotics like erythromycin and clarithromycin certain medicines for fungal infections like ketoconazole and itraconazole certain medicines for seizures like carbamazepine, phenobarbital, phenytoin gemfibrozil nefazodone rifampin St. John's wort This list may not describe all possible interactions. Give your health care provider a list of all the medicines, herbs, non-prescription drugs, or dietary supplements you use. Also tell them if you smoke, drink alcohol, or use illegal drugs. Some items may interact with your medicine. What should I watch for while using this medication? Your condition will be monitored carefully while you are receiving this medicine. You will need important  blood work done while you are taking this medicine. This medicine can cause serious allergic reactions. To reduce your risk you will need to take other medicine(s) before treatment with this medicine. If you experience allergic reactions like skin rash, itching or hives, swelling of the face, lips, or tongue, tell your doctor or health care professional right away. In some cases, you may be given additional medicines to help with side effects. Follow all directions for their use. This drug may make you feel generally unwell. This is not uncommon, as chemotherapy can affect healthy cells as well as cancer cells. Report any side effects. Continue your course of treatment even though you feel ill unless your doctor tells you to stop. Call your doctor or health care professional for advice if you get a fever, chills or sore throat, or other symptoms of a cold or flu. Do not treat yourself. This drug decreases your body's ability to fight infections. Try to avoid being around people who are sick. This medicine may increase your risk to bruise or bleed. Call your doctor or health care professional if you notice any unusual bleeding. Be careful brushing and flossing your teeth or using a toothpick because you may get an infection or bleed more easily. If you have any dental work done, tell your dentist you are receiving this medicine. Avoid taking products that contain aspirin, acetaminophen, ibuprofen, naproxen, or ketoprofen unless instructed by your doctor. These medicines may hide a fever.  Do not become pregnant while taking this medicine. Women should inform their doctor if they wish to become pregnant or think they might be pregnant. There is a potential for serious side effects to an unborn child. Talk to your health care professional or pharmacist for more information. Do not breast-feed an infant while taking this medicine. Men are advised not to father a child while receiving this medicine. This product  may contain alcohol. Ask your pharmacist or healthcare provider if this medicine contains alcohol. Be sure to tell all healthcare providers you are taking this medicine. Certain medicines, like metronidazole and disulfiram, can cause an unpleasant reaction when taken with alcohol. The reaction includes flushing, headache, nausea, vomiting, sweating, and increased thirst. The reaction can last from 30 minutes to several hours. What side effects may I notice from receiving this medication? Side effects that you should report to your doctor or health care professional as soon as possible: allergic reactions like skin rash, itching or hives, swelling of the face, lips, or tongue breathing problems changes in vision fast, irregular heartbeat high or low blood pressure mouth sores pain, tingling, numbness in the hands or feet signs of decreased platelets or bleeding - bruising, pinpoint red spots on the skin, black, tarry stools, blood in the urine signs of decreased red blood cells - unusually weak or tired, feeling faint or lightheaded, falls signs of infection - fever or chills, cough, sore throat, pain or difficulty passing urine signs and symptoms of liver injury like dark yellow or brown urine; general ill feeling or flu-like symptoms; light-colored stools; loss of appetite; nausea; right upper belly pain; unusually weak or tired; yellowing of the eyes or skin swelling of the ankles, feet, hands unusually slow heartbeat Side effects that usually do not require medical attention (report to your doctor or health care professional if they continue or are bothersome): diarrhea hair loss loss of appetite muscle or joint pain nausea, vomiting pain, redness, or irritation at site where injected tiredness This list may not describe all possible side effects. Call your doctor for medical advice about side effects. You may report side effects to FDA at 1-800-FDA-1088. Where should I keep my  medication? This drug is given in a hospital or clinic and will not be stored at home. NOTE: This sheet is a summary. It may not cover all possible information. If you have questions about this medicine, talk to your doctor, pharmacist, or health care provider.  2022 Elsevier/Gold Standard (2019-08-01 13:37:23) Trastuzumab injection for infusion What is this medication? TRASTUZUMAB (tras TOO zoo mab) is a monoclonal antibody. It is used to treat breast cancer and stomach cancer. This medicine may be used for other purposes; ask your health care provider or pharmacist if you have questions. COMMON BRAND NAME(S): Herceptin, Janae Bridgeman, Ontruzant, Trazimera What should I tell my care team before I take this medication? They need to know if you have any of these conditions: heart disease heart failure lung or breathing disease, like asthma an unusual or allergic reaction to trastuzumab, benzyl alcohol, or other medications, foods, dyes, or preservatives pregnant or trying to get pregnant breast-feeding How should I use this medication? This drug is given as an infusion into a vein. It is administered in a hospital or clinic by a specially trained health care professional. Talk to your pediatrician regarding the use of this medicine in children. This medicine is not approved for use in children. Overdosage: If you think you have taken too much of  this medicine contact a poison control center or emergency room at once. NOTE: This medicine is only for you. Do not share this medicine with others. What if I miss a dose? It is important not to miss a dose. Call your doctor or health care professional if you are unable to keep an appointment. What may interact with this medication? This medicine may interact with the following medications: certain types of chemotherapy, such as daunorubicin, doxorubicin, epirubicin, and idarubicin This list may not describe all possible interactions.  Give your health care provider a list of all the medicines, herbs, non-prescription drugs, or dietary supplements you use. Also tell them if you smoke, drink alcohol, or use illegal drugs. Some items may interact with your medicine. What should I watch for while using this medication? Visit your doctor for checks on your progress. Report any side effects. Continue your course of treatment even though you feel ill unless your doctor tells you to stop. Call your doctor or health care professional for advice if you get a fever, chills or sore throat, or other symptoms of a cold or flu. Do not treat yourself. Try to avoid being around people who are sick. You may experience fever, chills and shaking during your first infusion. These effects are usually mild and can be treated with other medicines. Report any side effects during the infusion to your health care professional. Fever and chills usually do not happen with later infusions. Do not become pregnant while taking this medicine or for 7 months after stopping it. Women should inform their doctor if they wish to become pregnant or think they might be pregnant. Women of child-bearing potential will need to have a negative pregnancy test before starting this medicine. There is a potential for serious side effects to an unborn child. Talk to your health care professional or pharmacist for more information. Do not breast-feed an infant while taking this medicine or for 7 months after stopping it. Women must use effective birth control with this medicine. What side effects may I notice from receiving this medication? Side effects that you should report to your doctor or health care professional as soon as possible: allergic reactions like skin rash, itching or hives, swelling of the face, lips, or tongue chest pain or palpitations cough dizziness feeling faint or lightheaded, falls fever general ill feeling or flu-like symptoms signs of worsening heart  failure like breathing problems; swelling in your legs and feet unusually weak or tired Side effects that usually do not require medical attention (report to your doctor or health care professional if they continue or are bothersome): bone pain changes in taste diarrhea joint pain nausea/vomiting weight loss This list may not describe all possible side effects. Call your doctor for medical advice about side effects. You may report side effects to FDA at 1-800-FDA-1088. Where should I keep my medication? This drug is given in a hospital or clinic and will not be stored at home. NOTE: This sheet is a summary. It may not cover all possible information. If you have questions about this medicine, talk to your doctor, pharmacist, or health care provider.  2022 Elsevier/Gold Standard (2016-08-24 14:37:52)

## 2021-07-06 ENCOUNTER — Inpatient Hospital Stay: Payer: Medicare Other

## 2021-07-06 ENCOUNTER — Encounter: Payer: Self-pay | Admitting: Oncology

## 2021-07-06 ENCOUNTER — Other Ambulatory Visit: Payer: Self-pay

## 2021-07-06 ENCOUNTER — Inpatient Hospital Stay (HOSPITAL_BASED_OUTPATIENT_CLINIC_OR_DEPARTMENT_OTHER): Payer: Medicare Other | Admitting: Oncology

## 2021-07-06 VITALS — BP 120/84 | HR 111 | Temp 98.6°F | Wt 117.1 lb

## 2021-07-06 DIAGNOSIS — C50919 Malignant neoplasm of unspecified site of unspecified female breast: Secondary | ICD-10-CM

## 2021-07-06 DIAGNOSIS — E876 Hypokalemia: Secondary | ICD-10-CM | POA: Diagnosis not present

## 2021-07-06 DIAGNOSIS — Z5111 Encounter for antineoplastic chemotherapy: Secondary | ICD-10-CM

## 2021-07-06 DIAGNOSIS — R000 Tachycardia, unspecified: Secondary | ICD-10-CM | POA: Diagnosis not present

## 2021-07-06 DIAGNOSIS — Z5112 Encounter for antineoplastic immunotherapy: Secondary | ICD-10-CM | POA: Diagnosis not present

## 2021-07-06 LAB — COMPREHENSIVE METABOLIC PANEL
ALT: 30 U/L (ref 0–44)
AST: 36 U/L (ref 15–41)
Albumin: 4.1 g/dL (ref 3.5–5.0)
Alkaline Phosphatase: 50 U/L (ref 38–126)
Anion gap: 10 (ref 5–15)
BUN: 12 mg/dL (ref 6–20)
CO2: 22 mmol/L (ref 22–32)
Calcium: 9.6 mg/dL (ref 8.9–10.3)
Chloride: 103 mmol/L (ref 98–111)
Creatinine, Ser: 0.73 mg/dL (ref 0.44–1.00)
GFR, Estimated: >60 mL/min (ref >60)
Glucose, Bld: 142 mg/dL — ABNORMAL HIGH (ref 70–99)
Potassium: 3.7 mmol/L (ref 3.5–5.1)
Sodium: 135 mmol/L (ref 135–145)
Total Bilirubin: 0.6 mg/dL (ref 0.3–1.2)
Total Protein: 7.3 g/dL (ref 6.5–8.1)

## 2021-07-06 LAB — CBC WITH DIFFERENTIAL/PLATELET
Abs Immature Granulocytes: 0.04 10*3/uL (ref 0.00–0.07)
Basophils Absolute: 0 10*3/uL (ref 0.0–0.1)
Basophils Relative: 1 %
Eosinophils Absolute: 0.2 10*3/uL (ref 0.0–0.5)
Eosinophils Relative: 3 %
HCT: 35.9 % — ABNORMAL LOW (ref 36.0–46.0)
Hemoglobin: 12.4 g/dL (ref 12.0–15.0)
Immature Granulocytes: 1 %
Lymphocytes Relative: 34 %
Lymphs Abs: 1.6 10*3/uL (ref 0.7–4.0)
MCH: 32.9 pg (ref 26.0–34.0)
MCHC: 34.5 g/dL (ref 30.0–36.0)
MCV: 95.2 fL (ref 80.0–100.0)
Monocytes Absolute: 0.4 10*3/uL (ref 0.1–1.0)
Monocytes Relative: 9 %
Neutro Abs: 2.4 10*3/uL (ref 1.7–7.7)
Neutrophils Relative %: 52 %
Platelets: 420 10*3/uL — ABNORMAL HIGH (ref 150–400)
RBC: 3.77 MIL/uL — ABNORMAL LOW (ref 3.87–5.11)
RDW: 13.2 % (ref 11.5–15.5)
WBC: 4.6 10*3/uL (ref 4.0–10.5)
nRBC: 0 % (ref 0.0–0.2)

## 2021-07-06 MED ORDER — SODIUM CHLORIDE 0.9 % IV SOLN
Freq: Once | INTRAVENOUS | Status: AC
  Filled 2021-07-06: qty 250

## 2021-07-06 MED ORDER — POTASSIUM CHLORIDE CRYS ER 10 MEQ PO TBCR: 10.0000 meq | EXTENDED_RELEASE_TABLET | Freq: Every day | ORAL | 1 refills | Status: DC

## 2021-07-06 NOTE — Progress Notes (Signed)
Hematology/Oncology follow up  note [facility] [phone] [phone]   Patient Care Team: Kirk Ruths, MD as PCP - General (Internal Medicine)  REFERRING PROVIDER: Kirk Ruths, MD  CHIEF COMPLAINTS/REASON FOR VISIT:  Follow up for treatment of breast cancer  HISTORY OF PRESENTING ILLNESS:   Anna Banks is a  45 y.o.  female with PMH listed below was seen in consultation at the request of  Kirk Ruths, MD  for evaluation of breast cancer.   Patient was accompanied by her half sister today. Per sister, patient has history of seizure since childhood and has intellectual/cognitive impairment. She is illiterate. She follows instructions from her family members.  She lives with her mother and her half sister who takes of her and helps her to make medical decision.   They waived interpretor. Both patient and her half sister speak [place].  Menarche 85 years of age. G0P0. No child, no previous pregnancy. Per her sister, patient is not sexually active No prior use of birth control pills or hormone replacement therapy. Denies previous history of biopsy.  02/16/2021, screening mammogram showed calcification in the right breast needs further evaluation.  Left breast possible mass with distortion requires further evaluation. 02/26/2021, left breast showed a 10 mm indeterminate mass, 2:00, 8 cm from nipple.  No mammographic evidence of malignancy in the right breast.  No left axillary lymphadenopathy. 03/10/2021, patient underwent ultrasound-guided biopsy of left breast mass.  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  ER low positive [1-10%], PR positive [1-10%], HER2 positive by IHC 3+  # 04/09/2021, status postlumpectomy and sentinel lymph node biopsy. Invasive mammary carcinoma, no special type, 4 lymph nodes all negative for metastatic carcinoma.   DCIS present.  LVI not identified.Grade 2, all margins negative for invasive carcinoma. pT1c pN0, ER[staining was repeated on surgical specimen] [11-50%]+ PR [1-10%]+, HER 2+  Baseline echocardiogram showed normal LVEF.  INTERVAL HISTORY Anna Banks is a 45 y.o. female who has above history reviewed by me today presents for follow up visit for chemotherapy for HER2 positive breast cancer  Patient is here by herself today. [place] interpreter online service was used. + hair loss + rash on her scalp, non itchy.  Patient denies any nausea vomiting diarrhea.  Heart rate is high in the clinic.  Patient is a poor historian.  Review of Systems  Unable to perform ROS: Other (intellectual/Cognitive impairment)  Constitutional:  Negative for appetite change, chills, fatigue and fever.  HENT:   Negative for hearing loss and voice change.   Eyes:  Negative for eye problems.  Respiratory:  Negative for chest tightness and cough.   Cardiovascular:  Negative for chest pain.  Gastrointestinal:  Negative for abdominal distention, abdominal pain and blood in stool.  Endocrine: Negative for hot flashes.  Genitourinary:  Negative for difficulty urinating and frequency.   Musculoskeletal:  Negative for arthralgias.  Skin:  Positive for rash. Negative for itching.       + hair loss  Neurological:  Negative for extremity weakness.  Hematological:  Negative for adenopathy.  Psychiatric/Behavioral:  Negative for confusion.    MEDICAL HISTORY:  Past Medical History:  Diagnosis Date   Down syndrome    Seizure [facility])     SURGICAL HISTORY: Past Surgical History:  Procedure Laterality Date   BREAST BIOPSY Left 03/10/2021   [place] BX,ribbon clip, [place]   BREAST LUMPECTOMY,RADIO FREQ LOCALIZER,AXILLARY  SENTINEL LYMPH NODE BIOPSY Left 04/09/2021   Procedure: BREAST LUMPECTOMY,RADIO FREQ LOCALIZER,AXILLARY SENTINEL LYMPH NODE BIOPSY;  Surgeon: Benjamine Sprague, DO;  Location: [facility] ORS;  Service: General;   Laterality: Left;    SOCIAL HISTORY: Social History   Socioeconomic History   Marital status: Single    Spouse name: Not on file   Number of children: Not on file   Years of education: Not on file   Highest education level: Not on file  Occupational History   Not on file  Tobacco Use   Smoking status: Never   Smokeless tobacco: Never  Vaping Use   Vaping Use: Never used  Substance and Sexual Activity   Alcohol use: Never   Drug use: Not Currently   Sexual activity: Not on file  Other Topics Concern   Not on file  Social History Narrative   Not on file   Social Determinants of Health   Financial Resource Strain: Not on file  Food Insecurity: Not on file  Transportation Needs: Not on file  Physical Activity: Not on file  Stress: Not on file  Social Connections: Not on file  Intimate Partner Violence: Not on file    FAMILY HISTORY: History reviewed. No pertinent family history.  ALLERGIES:  has No Known Allergies.  MEDICATIONS:  Current Outpatient Medications  Medication Sig Dispense Refill   acetaminophen (TYLENOL) 325 MG tablet Take 325-650 mg by mouth every 6 (six) hours as needed for moderate pain.     ethosuximide (ZARONTIN) 250 MG capsule Take 250 mg by mouth 2 (two) times daily.     VITAMIN D PO Take 1 capsule by mouth daily.     HYDROcodone-acetaminophen (NORCO) 5-325 MG tablet Take 1 tablet by mouth every 6 (six) hours as needed for up to 6 doses for moderate pain. (Patient not taking: No sig reported) 6 tablet 0   ibuprofen (ADVIL) 800 MG tablet Take 1 tablet (800 mg total) by mouth every 8 (eight) hours as needed for mild pain or moderate pain. (Patient not taking: No sig reported) 30 tablet 0   ondansetron (ZOFRAN) 8 MG tablet Take 1 tablet (8 mg total) by mouth 2 (two) times daily as needed (Nausea or vomiting). (Patient not taking: No sig reported) 30 tablet 1   potassium chloride (KLOR-CON) 10 MEQ tablet Take 1 tablet (10 mEq total) by mouth daily. 30  tablet 1   No current facility-administered medications for this visit.     PHYSICAL EXAMINATION: ECOG PERFORMANCE STATUS: 0 - Asymptomatic Vitals:   07/06/21 0926  BP: 120/84  Pulse: (!) 111  Temp: 98.6 F (37 C)   Filed Weights   07/06/21 0926  Weight: 117 lb 1.6 oz (53.1 kg)    Physical Exam Constitutional:      General: She is not in acute distress. HENT:     Head: Normocephalic and atraumatic.  Eyes:     General: No scleral icterus. Cardiovascular:     Rate and Rhythm: Regular rhythm. Tachycardia present.     Heart sounds: Normal heart sounds.  Pulmonary:     Effort: Pulmonary effort is normal. No respiratory distress.     Breath sounds: No wheezing.  Abdominal:     General: Bowel sounds are normal. There is no distension.     Palpations: Abdomen is soft.  Musculoskeletal:        General: No deformity. Normal range of motion.     Cervical back: Normal range of motion and neck supple.  Skin:  General: Skin is warm and dry.     Findings: No erythema or rash.  Neurological:     Mental Status: She is alert. Mental status is at baseline.     Cranial Nerves: No cranial nerve deficit.     Coordination: Coordination normal.  Psychiatric:        Mood and Affect: Mood normal.   .   LABORATORY DATA:  I have reviewed the data as listed Lab Results  Component Value Date   WBC 4.6 07/06/2021   HGB 12.4 07/06/2021   HCT 35.9 (L) 07/06/2021   MCV 95.2 07/06/2021   PLT 420 (H) 07/06/2021   Recent Labs    06/22/21 0823 06/29/21 0826 07/06/21 0827  NA 135 135 135  K 3.9 3.5 3.7  CL 105 103 103  CO2 25 26 22   GLUCOSE 147* 143* 142*  BUN 8 12 12   CREATININE 0.68 0.67 0.73  CALCIUM 9.0 9.3 9.6  GFRNONAA >60 >60 >60  PROT 7.0 6.9 7.3  ALBUMIN 3.9 3.9 4.1  AST 28 25 36  ALT 26 27 30   ALKPHOS 49 47 50  BILITOT 0.3 0.2* 0.6    Iron/TIBC/Ferritin/ %Sat No results found for: IRON, TIBC, FERRITIN, IRONPCTSAT    RADIOGRAPHIC STUDIES: I have personally  reviewed the radiological images as listed and agreed with the findings in the report. No results found.    ASSESSMENT & PLAN:  1. Invasive carcinoma of breast ([place])   2. Encounter for antineoplastic chemotherapy   3. Hypokalemia   4. Tachycardia   Cancer Staging Invasive carcinoma of breast ([place]) Staging form: Breast, AJCC 8th Edition - Pathologic stage from 04/22/2021: Stage IA (pT1c, pN0, cM0, G2, ER+, PR+, HER2+) - Signed by Earlie Server, MD on 04/22/2021   Cancer Staging Invasive carcinoma of breast [facility]) Staging form: Breast, AJCC 8th Edition - Pathologic stage from 04/22/2021: Stage IA (pT1c, pN0, cM0, G2, ER+, PR+, HER2+) - Signed by Earlie Server, MD on 04/22/2021  #Left invasive carcinoma of breast,pT1c pN0 cM0 HER2 positive ER 11-50% positive,  PR 1-10% positive.   Labs reviewed and discussed with patient .  Hold chemotherapy due to tachycardia  #Tachycardia, etiology unknown. Rhythm is regular.  Recommend patient to increase oral hydration Patient receives 1 L of IV fluid normal saline x1 today. We may repeat echocardiogram if patient is persistently tachycardic.  Hypokalemia, mild. K is 3.7. continue oral potassium chloride 70mq daily.  We spent sufficient time to discuss many aspect of care, questions were answered to patient's satisfaction.     All questions were answered. The patient knows to call the clinic with any problems questions or concerns.  cc AKirk Ruths MD   Follow-up in 1 week for lab MD Taxol and trastuzumab treatments. ZEarlie Server MD, PhD. 07/06/2021

## 2021-07-07 ENCOUNTER — Other Ambulatory Visit: Payer: Self-pay | Admitting: Oncology

## 2021-07-13 ENCOUNTER — Encounter: Payer: Self-pay | Admitting: Oncology

## 2021-07-13 ENCOUNTER — Inpatient Hospital Stay: Payer: Medicare Other

## 2021-07-13 ENCOUNTER — Other Ambulatory Visit: Payer: Self-pay

## 2021-07-13 ENCOUNTER — Inpatient Hospital Stay (HOSPITAL_BASED_OUTPATIENT_CLINIC_OR_DEPARTMENT_OTHER): Payer: Medicare Other | Admitting: Oncology

## 2021-07-13 VITALS — BP 121/87 | HR 97 | Temp 99.3°F | Resp 18 | Wt 117.5 lb

## 2021-07-13 DIAGNOSIS — C50919 Malignant neoplasm of unspecified site of unspecified female breast: Secondary | ICD-10-CM

## 2021-07-13 DIAGNOSIS — Z5111 Encounter for antineoplastic chemotherapy: Secondary | ICD-10-CM

## 2021-07-13 DIAGNOSIS — E876 Hypokalemia: Secondary | ICD-10-CM | POA: Diagnosis not present

## 2021-07-13 DIAGNOSIS — Z5112 Encounter for antineoplastic immunotherapy: Secondary | ICD-10-CM | POA: Diagnosis not present

## 2021-07-13 LAB — COMPREHENSIVE METABOLIC PANEL
ALT: 22 U/L (ref 0–44)
AST: 29 U/L (ref 15–41)
Albumin: 4.2 g/dL (ref 3.5–5.0)
Alkaline Phosphatase: 54 U/L (ref 38–126)
Anion gap: 10 (ref 5–15)
BUN: 13 mg/dL (ref 6–20)
CO2: 23 mmol/L (ref 22–32)
Calcium: 9.4 mg/dL (ref 8.9–10.3)
Chloride: 99 mmol/L (ref 98–111)
Creatinine, Ser: 0.64 mg/dL (ref 0.44–1.00)
GFR, Estimated: >60 mL/min (ref >60)
Glucose, Bld: 135 mg/dL — ABNORMAL HIGH (ref 70–99)
Potassium: 4.1 mmol/L (ref 3.5–5.1)
Sodium: 132 mmol/L — ABNORMAL LOW (ref 135–145)
Total Bilirubin: 0.6 mg/dL (ref 0.3–1.2)
Total Protein: 7.7 g/dL (ref 6.5–8.1)

## 2021-07-13 LAB — CBC WITH DIFFERENTIAL/PLATELET
Abs Immature Granulocytes: 0.03 10*3/uL (ref 0.00–0.07)
Basophils Absolute: 0.1 10*3/uL (ref 0.0–0.1)
Basophils Relative: 1 %
Eosinophils Absolute: 0.3 10*3/uL (ref 0.0–0.5)
Eosinophils Relative: 5 %
HCT: 38.2 % (ref 36.0–46.0)
Hemoglobin: 13 g/dL (ref 12.0–15.0)
Immature Granulocytes: 1 %
Lymphocytes Relative: 36 %
Lymphs Abs: 1.9 10*3/uL (ref 0.7–4.0)
MCH: 32.6 pg (ref 26.0–34.0)
MCHC: 34 g/dL (ref 30.0–36.0)
MCV: 95.7 fL (ref 80.0–100.0)
Monocytes Absolute: 0.7 10*3/uL (ref 0.1–1.0)
Monocytes Relative: 12 %
Neutro Abs: 2.5 10*3/uL (ref 1.7–7.7)
Neutrophils Relative %: 45 %
Platelets: 373 10*3/uL (ref 150–400)
RBC: 3.99 MIL/uL (ref 3.87–5.11)
RDW: 13.2 % (ref 11.5–15.5)
WBC: 5.4 10*3/uL (ref 4.0–10.5)
nRBC: 0 % (ref 0.0–0.2)

## 2021-07-13 LAB — PREGNANCY, URINE: Preg Test, Ur: NEGATIVE

## 2021-07-13 MED ORDER — DIPHENHYDRAMINE HCL 50 MG/ML IJ SOLN
50.0000 mg | Freq: Once | INTRAMUSCULAR | Status: AC
  Administered 2021-07-13: 50 mg via INTRAVENOUS
  Filled 2021-07-13: qty 1

## 2021-07-13 MED ORDER — SODIUM CHLORIDE 0.9 % IV SOLN
10.0000 mg | Freq: Once | INTRAVENOUS | Status: AC
  Administered 2021-07-13: 10 mg via INTRAVENOUS
  Filled 2021-07-13: qty 10

## 2021-07-13 MED ORDER — SODIUM CHLORIDE 0.9 % IV SOLN
80.0000 mg/m2 | Freq: Once | INTRAVENOUS | Status: AC
  Administered 2021-07-13: 120 mg via INTRAVENOUS
  Filled 2021-07-13: qty 20

## 2021-07-13 MED ORDER — TRASTUZUMAB-DKST CHEMO 150 MG IV SOLR
2.0000 mg/kg | Freq: Once | INTRAVENOUS | Status: AC
  Administered 2021-07-13: 105 mg via INTRAVENOUS
  Filled 2021-07-13: qty 5

## 2021-07-13 MED ORDER — SODIUM CHLORIDE 0.9 % IV SOLN
Freq: Once | INTRAVENOUS | Status: AC
  Filled 2021-07-13: qty 250

## 2021-07-13 MED ORDER — FAMOTIDINE 20 MG IN NS 100 ML IVPB
20.0000 mg | Freq: Once | INTRAVENOUS | Status: AC
  Administered 2021-07-13: 20 mg via INTRAVENOUS
  Filled 2021-07-13: qty 20

## 2021-07-13 MED ORDER — ACETAMINOPHEN 325 MG PO TABS
650.0000 mg | ORAL_TABLET | Freq: Once | ORAL | Status: AC
  Administered 2021-07-13: 650 mg via ORAL
  Filled 2021-07-13: qty 2

## 2021-07-13 NOTE — Progress Notes (Signed)
Hematology/Oncology follow up  note [facility] Telephone:([phone] Fax:(336) [phone]   Patient Care Team: Kirk Ruths, MD as PCP - General (Internal Medicine)  REFERRING PROVIDER: Kirk Ruths, MD  CHIEF COMPLAINTS/REASON FOR VISIT:  Follow up for treatment of breast cancer  HISTORY OF PRESENTING ILLNESS:   Anna Banks is a  45 y.o.  female with PMH listed below was seen in consultation at the request of  Kirk Ruths, MD  for evaluation of breast cancer.   Patient was accompanied by her half sister today. Per sister, patient has history of seizure since childhood and has intellectual/cognitive impairment. She is illiterate. She follows instructions from her family members.  She lives with her mother and her half sister who takes of her and helps her to make medical decision.   They waived interpretor. Both patient and her half sister speak [place].  Menarche 59 years of age. G0P0. No child, no previous pregnancy. Per her sister, patient is not sexually active No prior use of birth control pills or hormone replacement therapy. Denies previous history of biopsy.  02/16/2021, screening mammogram showed calcification in the right breast needs further evaluation.  Left breast possible mass with distortion requires further evaluation. 02/26/2021, left breast showed a 10 mm indeterminate mass, 2:00, 8 cm from nipple.  No mammographic evidence of malignancy in the right breast.  No left axillary lymphadenopathy. 03/10/2021, patient underwent ultrasound-guided biopsy of left breast mass.  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  ER low positive [1-10%], PR positive [1-10%], HER2 positive by IHC 3+  # 04/09/2021, status postlumpectomy and sentinel lymph node biopsy. Invasive mammary carcinoma, no special type, 4 lymph nodes all negative for metastatic carcinoma.   DCIS present.  LVI not identified.Grade 2, all margins negative for invasive carcinoma. pT1c pN0, ER[staining was repeated on surgical specimen] [11-50%]+ PR [1-10%]+, HER 2+  Baseline echocardiogram showed normal LVEF.  INTERVAL HISTORY Anna dung [name] is a 45 y.o. female who has above history reviewed by me today presents for follow up visit for chemotherapy for HER2 positive breast cancer  Patient is here by herself today. [place] interpreter online service was used. + hair loss + rash on her scalp, non itchy, improved.  Denies any numbness tingling. Denies nausea vomiting diarrhea.    Review of Systems  Unable to perform ROS: Other (intellectual/Cognitive impairment)  Constitutional:  Negative for appetite change, chills, fatigue and fever.  HENT:   Negative for hearing loss and voice change.   Eyes:  Negative for eye problems.  Respiratory:  Negative for chest tightness and cough.   Cardiovascular:  Negative for chest pain.  Gastrointestinal:  Negative for abdominal distention, abdominal pain and blood in stool.  Endocrine: Negative for hot flashes.  Genitourinary:  Negative for difficulty urinating and frequency.   Musculoskeletal:  Negative for arthralgias.  Skin:  Negative for itching and rash.       + hair loss  Neurological:  Negative for extremity weakness.  Hematological:  Negative for adenopathy.  Psychiatric/Behavioral:  Negative for confusion.    MEDICAL HISTORY:  Past Medical History:  Diagnosis Date   Down syndrome    Seizure [facility])     SURGICAL HISTORY: Past Surgical History:  Procedure Laterality Date   BREAST BIOPSY Left 03/10/2021   [place] BX,ribbon clip, [facility]   BREAST LUMPECTOMY,RADIO FREQ LOCALIZER,AXILLARY SENTINEL LYMPH NODE BIOPSY Left 04/09/2021   Procedure: BREAST  [place] SENTINEL LYMPH NODE BIOPSY;  Surgeon: Benjamine Sprague, DO;  Location: [facility] ORS;  Service: General;  Laterality: Left;    SOCIAL HISTORY: Social  History   Socioeconomic History   Marital status: Single    Spouse name: Not on file   Number of children: Not on file   Years of education: Not on file   Highest education level: Not on file  Occupational History   Not on file  Tobacco Use   Smoking status: Never   Smokeless tobacco: Never  Vaping Use   Vaping Use: Never used  Substance and Sexual Activity   Alcohol use: Never   Drug use: Not Currently   Sexual activity: Not on file  Other Topics Concern   Not on file  Social History Narrative   Not on file   Social Determinants of Health   Financial Resource Strain: Not on file  Food Insecurity: Not on file  Transportation Needs: Not on file  Physical Activity: Not on file  Stress: Not on file  Social Connections: Not on file  Intimate Partner Violence: Not on file    FAMILY HISTORY: History reviewed. No pertinent family history.  ALLERGIES:  has No Known Allergies.  MEDICATIONS:  Current Outpatient Medications  Medication Sig Dispense Refill   acetaminophen (TYLENOL) 325 MG tablet Take 325-650 mg by mouth every 6 (six) hours as needed for moderate pain.     ethosuximide (ZARONTIN) 250 MG capsule Take 250 mg by mouth 2 (two) times daily.     potassium chloride (KLOR-CON) 10 MEQ tablet Take 1 tablet (10 mEq total) by mouth daily. 30 tablet 1   VITAMIN D PO Take 1 capsule by mouth daily.     HYDROcodone-acetaminophen (NORCO) 5-325 MG tablet Take 1 tablet by mouth every 6 (six) hours as needed for up to 6 doses for moderate pain. (Patient not taking: No sig reported) 6 tablet 0   ondansetron (ZOFRAN) 8 MG tablet Take 1 tablet (8 mg total) by mouth 2 (two) times daily as needed (Nausea or vomiting). (Patient not taking: No sig reported) 30 tablet 1   No current facility-administered medications for this visit.     PHYSICAL EXAMINATION: ECOG PERFORMANCE STATUS: 0 - Asymptomatic Vitals:   07/13/21 1008  BP: 121/87  Pulse: 97  Resp: 18  Temp: 99.3 F (37.4 C)    Filed Weights   07/13/21 1008  Weight: 117 lb 8 oz (53.3 kg)    Physical Exam Constitutional:      General: She is not in acute distress. HENT:     Head: Normocephalic and atraumatic.  Eyes:     General: No scleral icterus. Cardiovascular:     Rate and Rhythm: Regular rhythm. Tachycardia present.     Heart sounds: Normal heart sounds.  Pulmonary:     Effort: Pulmonary effort is normal. No respiratory distress.     Breath sounds: No wheezing.  Abdominal:     General: Bowel sounds are normal. There is no distension.     Palpations: Abdomen is soft.  Musculoskeletal:        General: No deformity. Normal range of motion.     Cervical back: Normal range of motion and neck supple.  Skin:    General: Skin is warm and dry.     Findings: No erythema or rash.  Neurological:     Mental Status: She is alert. Mental status is at baseline.     Cranial Nerves: No cranial nerve deficit.  Coordination: Coordination normal.  Psychiatric:        Mood and Affect: Mood normal.   .   LABORATORY DATA:  I have reviewed the data as listed Lab Results  Component Value Date   WBC 5.4 07/13/2021   HGB 13.0 07/13/2021   HCT 38.2 07/13/2021   MCV 95.7 07/13/2021   PLT 373 07/13/2021   Recent Labs    06/29/21 0826 07/06/21 0827 07/13/21 0843  NA 135 135 132*  K 3.5 3.7 4.1  CL 103 103 99  CO2 26 22 23   GLUCOSE 143* 142* 135*  BUN 12 12 13   CREATININE 0.67 0.73 0.64  CALCIUM 9.3 9.6 9.4  GFRNONAA >60 >60 >60  PROT 6.9 7.3 7.7  ALBUMIN 3.9 4.1 4.2  AST 25 36 29  ALT 27 30 22   ALKPHOS 47 50 54  BILITOT 0.2* 0.6 0.6    Iron/TIBC/Ferritin/ %Sat No results found for: IRON, TIBC, FERRITIN, IRONPCTSAT    RADIOGRAPHIC STUDIES: I have personally reviewed the radiological images as listed and agreed with the findings in the report. No results found.    ASSESSMENT & PLAN:  1. Invasive carcinoma of breast ([place])   2. Encounter for antineoplastic chemotherapy   3.  Hypokalemia   Cancer Staging Invasive carcinoma of breast ([place]) Staging form: Breast, AJCC 8th Edition - Pathologic stage from 04/22/2021: Stage IA (pT1c, pN0, cM0, G2, ER+, PR+, HER2+) - Signed by Earlie Server, MD on 04/22/2021   Cancer Staging Invasive carcinoma of breast [facility]) Staging form: Breast, AJCC 8th Edition - Pathologic stage from 04/22/2021: Stage IA (pT1c, pN0, cM0, G2, ER+, PR+, HER2+) - Signed by Earlie Server, MD on 04/22/2021  #Left invasive carcinoma of breast,pT1c pN0 cM0 HER2 positive ER 11-50% positive,  PR 1-10% positive.   Labs reviewed and discussed with patient Tachycardia has resolved She is doing well clinically. Proceed with Taxol and Herceptin  Hypokalemia, mild. K is 4.1. continue oral potassium chloride 56mq daily.  Refills were sent.  We spent sufficient time to discuss many aspect of care, questions were answered to patient's satisfaction.     All questions were answered. The patient knows to call the clinic with any problems questions or concerns.  cc AKirk Ruths MD   Follow-up in 1 week for lab MD Taxol and trastuzumab treatments. ZEarlie Server MD, PhD. 07/13/2021

## 2021-07-13 NOTE — Patient Instructions (Addendum)
[facility]   Discharge Instructions: Thank you for choosing [facility] to provide your oncology and hematology care.  If you have a lab appointment with the [facility], please go directly to the [facility] and check in at the registration area.  We strive to give you quality time with your provider. You may need to reschedule your appointment if you arrive late (15 or more minutes).  Arriving late affects you and other patients whose appointments are after yours.  Also, if you miss three or more appointments without notifying the office, you may be dismissed from the clinic at the provider's discretion.      For prescription refill requests, have your pharmacy contact our office and allow 72 hours for refills to be completed.    Today you received the following chemotherapy and/or immunotherapy agents: Ogivri and Taxol.      To help prevent nausea and vomiting after your treatment, we encourage you to take your nausea medication as directed.  BELOW ARE SYMPTOMS THAT SHOULD BE REPORTED IMMEDIATELY: *FEVER GREATER THAN 100.4 F (38 C) OR HIGHER *CHILLS OR SWEATING *NAUSEA AND VOMITING THAT IS NOT CONTROLLED WITH YOUR NAUSEA MEDICATION *UNUSUAL SHORTNESS OF BREATH *UNUSUAL BRUISING OR BLEEDING *URINARY PROBLEMS (pain or burning when urinating, or frequent urination) *BOWEL PROBLEMS (unusual diarrhea, constipation, pain near the anus) TENDERNESS IN MOUTH AND THROAT WITH OR WITHOUT PRESENCE OF ULCERS (sore throat, sores in mouth, or a toothache) UNUSUAL RASH, SWELLING OR PAIN  UNUSUAL VAGINAL DISCHARGE OR ITCHING   Items with * indicate a potential emergency and should be followed up as soon as possible or go to the Emergency Department if any problems should occur.  Please show the CHEMOTHERAPY ALERT CARD or IMMUNOTHERAPY ALERT CARD at check-in to the Emergency Department and triage nurse.  Should you have questions after your visit  or need to cancel or reschedule your appointment, please contact [facility]  [phone] and follow the prompts.  Office hours are 8:00 a.m. to 4:30 p.m. Monday - Friday. Please note that voicemails left after 4:00 p.m. may not be returned until the following business day.  We are closed weekends and major holidays. You have access to a nurse at all times for urgent questions. Please call the main number to the clinic [phone] and follow the prompts.  For any non-urgent questions, you may also contact your provider using MyChart. We now offer e-Visits for anyone 20 and older to request care online for non-urgent symptoms. For details visit [email or web address].   Also download the MyChart app! Go to the app store, search "MyChart", open the app, select [facility], and log in with your MyChart username and password.  Due to Covid, a mask is required upon entering the hospital/clinic. If you do not have a mask, one will be given to you upon arrival. For doctor visits, patients may have 1 support person aged 69 or older with them. For treatment visits, patients cannot have anyone with them due to current Covid guidelines and our immunocompromised population.

## 2021-07-13 NOTE — Progress Notes (Signed)
Pt here for follow up. No new concerns voiced.   

## 2021-07-20 ENCOUNTER — Encounter: Payer: Self-pay | Admitting: Oncology

## 2021-07-20 ENCOUNTER — Inpatient Hospital Stay: Payer: Medicare Other | Attending: Oncology

## 2021-07-20 ENCOUNTER — Inpatient Hospital Stay (HOSPITAL_BASED_OUTPATIENT_CLINIC_OR_DEPARTMENT_OTHER): Payer: Medicare Other | Admitting: Oncology

## 2021-07-20 ENCOUNTER — Inpatient Hospital Stay: Payer: Medicare Other

## 2021-07-20 ENCOUNTER — Other Ambulatory Visit: Payer: Self-pay

## 2021-07-20 VITALS — BP 127/81 | HR 111 | Temp 97.7°F | Wt 117.0 lb

## 2021-07-20 VITALS — BP 103/62 | HR 93

## 2021-07-20 DIAGNOSIS — R000 Tachycardia, unspecified: Secondary | ICD-10-CM

## 2021-07-20 DIAGNOSIS — Z5112 Encounter for antineoplastic immunotherapy: Secondary | ICD-10-CM | POA: Diagnosis not present

## 2021-07-20 DIAGNOSIS — Z79899 Other long term (current) drug therapy: Secondary | ICD-10-CM

## 2021-07-20 DIAGNOSIS — C50919 Malignant neoplasm of unspecified site of unspecified female breast: Secondary | ICD-10-CM

## 2021-07-20 DIAGNOSIS — C50412 Malignant neoplasm of upper-outer quadrant of left female breast: Secondary | ICD-10-CM | POA: Diagnosis present

## 2021-07-20 DIAGNOSIS — Z5111 Encounter for antineoplastic chemotherapy: Secondary | ICD-10-CM | POA: Diagnosis present

## 2021-07-20 DIAGNOSIS — E876 Hypokalemia: Secondary | ICD-10-CM | POA: Diagnosis not present

## 2021-07-20 DIAGNOSIS — Q909 Down syndrome, unspecified: Secondary | ICD-10-CM | POA: Diagnosis not present

## 2021-07-20 DIAGNOSIS — Z5181 Encounter for therapeutic drug level monitoring: Secondary | ICD-10-CM

## 2021-07-20 DIAGNOSIS — Z170 Estrogen receptor positive status [ER+]: Secondary | ICD-10-CM | POA: Diagnosis not present

## 2021-07-20 LAB — CBC WITH DIFFERENTIAL/PLATELET
Abs Immature Granulocytes: 0.05 10*3/uL (ref 0.00–0.07)
Basophils Absolute: 0.1 10*3/uL (ref 0.0–0.1)
Basophils Relative: 1 %
Eosinophils Absolute: 0.3 10*3/uL (ref 0.0–0.5)
Eosinophils Relative: 6 %
HCT: 38 % (ref 36.0–46.0)
Hemoglobin: 12.6 g/dL (ref 12.0–15.0)
Immature Granulocytes: 1 %
Lymphocytes Relative: 35 %
Lymphs Abs: 1.9 10*3/uL (ref 0.7–4.0)
MCH: 32.1 pg (ref 26.0–34.0)
MCHC: 33.2 g/dL (ref 30.0–36.0)
MCV: 96.9 fL (ref 80.0–100.0)
Monocytes Absolute: 0.4 10*3/uL (ref 0.1–1.0)
Monocytes Relative: 7 %
Neutro Abs: 2.7 10*3/uL (ref 1.7–7.7)
Neutrophils Relative %: 50 %
Platelets: 388 10*3/uL (ref 150–400)
RBC: 3.92 MIL/uL (ref 3.87–5.11)
RDW: 13.2 % (ref 11.5–15.5)
WBC: 5.5 10*3/uL (ref 4.0–10.5)
nRBC: 0 % (ref 0.0–0.2)

## 2021-07-20 LAB — COMPREHENSIVE METABOLIC PANEL
ALT: 26 U/L (ref 0–44)
AST: 34 U/L (ref 15–41)
Albumin: 4.4 g/dL (ref 3.5–5.0)
Alkaline Phosphatase: 57 U/L (ref 38–126)
Anion gap: 10 (ref 5–15)
BUN: 15 mg/dL (ref 6–20)
CO2: 26 mmol/L (ref 22–32)
Calcium: 9.6 mg/dL (ref 8.9–10.3)
Chloride: 101 mmol/L (ref 98–111)
Creatinine, Ser: 0.78 mg/dL (ref 0.44–1.00)
GFR, Estimated: >60 mL/min (ref >60)
Glucose, Bld: 175 mg/dL — ABNORMAL HIGH (ref 70–99)
Potassium: 3.8 mmol/L (ref 3.5–5.1)
Sodium: 137 mmol/L (ref 135–145)
Total Bilirubin: 0.6 mg/dL (ref 0.3–1.2)
Total Protein: 7.9 g/dL (ref 6.5–8.1)

## 2021-07-20 MED ORDER — TRASTUZUMAB-DKST CHEMO 150 MG IV SOLR
2.0000 mg/kg | Freq: Once | INTRAVENOUS | Status: AC
  Administered 2021-07-20: 105 mg via INTRAVENOUS
  Filled 2021-07-20: qty 5

## 2021-07-20 MED ORDER — SODIUM CHLORIDE 0.9 % IV SOLN
80.0000 mg/m2 | Freq: Once | INTRAVENOUS | Status: AC
  Administered 2021-07-20: 120 mg via INTRAVENOUS
  Filled 2021-07-20: qty 20

## 2021-07-20 MED ORDER — SODIUM CHLORIDE 0.9 % IV SOLN
10.0000 mg | Freq: Once | INTRAVENOUS | Status: AC
  Administered 2021-07-20: 10 mg via INTRAVENOUS
  Filled 2021-07-20: qty 10

## 2021-07-20 MED ORDER — SODIUM CHLORIDE 0.9 % IV SOLN
Freq: Once | INTRAVENOUS | Status: AC
  Filled 2021-07-20: qty 250

## 2021-07-20 MED ORDER — DIPHENHYDRAMINE HCL 50 MG/ML IJ SOLN
50.0000 mg | Freq: Once | INTRAMUSCULAR | Status: AC
  Administered 2021-07-20: 50 mg via INTRAVENOUS
  Filled 2021-07-20: qty 1

## 2021-07-20 MED ORDER — ACETAMINOPHEN 325 MG PO TABS
650.0000 mg | ORAL_TABLET | Freq: Once | ORAL | Status: AC
  Administered 2021-07-20: 650 mg via ORAL
  Filled 2021-07-20: qty 2

## 2021-07-20 MED ORDER — FAMOTIDINE 20 MG IN NS 100 ML IVPB
20.0000 mg | Freq: Once | INTRAVENOUS | Status: AC
  Administered 2021-07-20: 20 mg via INTRAVENOUS
  Filled 2021-07-20: qty 20

## 2021-07-20 NOTE — Progress Notes (Signed)
Hematology/Oncology follow up  note [facility] Telephone:(336) [phone] Fax:(336) [phone]   Patient Care Team: Kirk Ruths, MD as PCP - General (Internal Medicine)  REFERRING PROVIDER: Kirk Ruths, MD  CHIEF COMPLAINTS/REASON FOR VISIT:  Follow up for treatment of breast cancer  HISTORY OF PRESENTING ILLNESS:   Anna Banks is a  45 y.o.  female with PMH listed below was seen in consultation at the request of  Kirk Ruths, MD  for evaluation of breast cancer.   Patient was accompanied by her half sister today. Per sister, patient has history of seizure since childhood and has intellectual/cognitive impairment. She is illiterate. She follows instructions from her family members.  She lives with her mother and her half sister who takes of her and helps her to make medical decision.   They waived interpretor. Both patient and her half sister speak [place].  Menarche 89 years of age. G0P0. No child, no previous pregnancy. Per her sister, patient is not sexually active No prior use of birth control pills or hormone replacement therapy. Denies previous history of biopsy.  02/16/2021, screening mammogram showed calcification in the right breast needs further evaluation.  Left breast possible mass with distortion requires further evaluation. 02/26/2021, left breast showed a 10 mm indeterminate mass, 2:00, 8 cm from nipple.  No mammographic evidence of malignancy in the right breast.  No left axillary lymphadenopathy. 03/10/2021, patient underwent ultrasound-guided biopsy of left breast mass.  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  ER low positive [1-10%], PR positive [1-10%], HER2 positive by IHC 3+  # 04/09/2021, status postlumpectomy and sentinel lymph node biopsy. Invasive mammary carcinoma, no special type, 4 lymph nodes all negative for metastatic carcinoma.   DCIS present.  LVI not identified.Grade 2, all margins negative for invasive carcinoma. pT1c pN0, ER[staining was repeated on surgical specimen] [11-50%]+ PR [1-10%]+, HER 2+  Baseline echocardiogram showed normal LVEF.  INTERVAL HISTORY Anna dung Boughner is a 45 y.o. female who has above history reviewed by me today presents for follow up visit for chemotherapy for HER2 positive breast cancer  Patient is here by herself today. [place] interpreter online service was used. + hair loss + rash on her scalp, non itchy, improved.   Patient denies any numbness, tingling, diarrhea. No nausea vomiting, shortness of breath, leg swelling. She has no new complaints .   Review of Systems  Unable to perform ROS: Other (intellectual/Cognitive impairment)  Constitutional:  Negative for appetite change, chills, fatigue and fever.  HENT:   Negative for hearing loss and voice change.   Eyes:  Negative for eye problems.  Respiratory:  Negative for chest tightness and cough.   Cardiovascular:  Negative for chest pain.  Gastrointestinal:  Negative for abdominal distention, abdominal pain and blood in stool.  Endocrine: Negative for hot flashes.  Genitourinary:  Negative for difficulty urinating and frequency.   Musculoskeletal:  Negative for arthralgias.  Skin:  Negative for itching and rash.       + hair loss  Neurological:  Negative for extremity weakness.  Hematological:  Negative for adenopathy.  Psychiatric/Behavioral:  Negative for confusion.    MEDICAL HISTORY:  Past Medical History:  Diagnosis Date   Down syndrome    Seizure [facility])     SURGICAL HISTORY: Past Surgical History:  Procedure Laterality Date   BREAST BIOPSY Left 03/10/2021   [place] BX,ribbon clip, [place]   BREAST LUMPECTOMY,RADIO  FREQ LOCALIZER,AXILLARY SENTINEL LYMPH NODE BIOPSY Left 04/09/2021   Procedure: BREAST LUMPECTOMY,RADIO FREQ LOCALIZER,AXILLARY SENTINEL LYMPH NODE BIOPSY;  Surgeon: Benjamine Sprague, DO;  Location: [facility] ORS;   Service: General;  Laterality: Left;    SOCIAL HISTORY: Social History   Socioeconomic History   Marital status: Single    Spouse name: Not on file   Number of children: Not on file   Years of education: Not on file   Highest education level: Not on file  Occupational History   Not on file  Tobacco Use   Smoking status: Never   Smokeless tobacco: Never  Vaping Use   Vaping Use: Never used  Substance and Sexual Activity   Alcohol use: Never   Drug use: Not Currently   Sexual activity: Not on file  Other Topics Concern   Not on file  Social History Narrative   Not on file   Social Determinants of Health   Financial Resource Strain: Not on file  Food Insecurity: Not on file  Transportation Needs: Not on file  Physical Activity: Not on file  Stress: Not on file  Social Connections: Not on file  Intimate Partner Violence: Not on file    FAMILY HISTORY: History reviewed. No pertinent family history.  ALLERGIES:  has No Known Allergies.  MEDICATIONS:  Current Outpatient Medications  Medication Sig Dispense Refill   acetaminophen (TYLENOL) 325 MG tablet Take 325-650 mg by mouth every 6 (six) hours as needed for moderate pain.     ethosuximide (ZARONTIN) 250 MG capsule Take 250 mg by mouth 2 (two) times daily.     potassium chloride (KLOR-CON) 10 MEQ tablet Take 1 tablet (10 mEq total) by mouth daily. 30 tablet 1   VITAMIN D PO Take 1 capsule by mouth daily.     HYDROcodone-acetaminophen (NORCO) 5-325 MG tablet Take 1 tablet by mouth every 6 (six) hours as needed for up to 6 doses for moderate pain. (Patient not taking: No sig reported) 6 tablet 0   ondansetron (ZOFRAN) 8 MG tablet Take 1 tablet (8 mg total) by mouth 2 (two) times daily as needed (Nausea or vomiting). (Patient not taking: No sig reported) 30 tablet 1   No current facility-administered medications for this visit.     PHYSICAL EXAMINATION: ECOG PERFORMANCE STATUS: 0 - Asymptomatic Vitals:   07/20/21  0838  BP: 127/81  Pulse: (!) 111  Temp: 97.7 F (36.5 C)   Filed Weights   07/20/21 0838  Weight: 117 lb (53.1 kg)    Physical Exam Constitutional:      General: She is not in acute distress. HENT:     Head: Normocephalic and atraumatic.  Eyes:     General: No scleral icterus. Cardiovascular:     Rate and Rhythm: Regular rhythm. Tachycardia present.     Heart sounds: Normal heart sounds.  Pulmonary:     Effort: Pulmonary effort is normal. No respiratory distress.     Breath sounds: No wheezing.  Abdominal:     General: Bowel sounds are normal. There is no distension.     Palpations: Abdomen is soft.  Musculoskeletal:        General: No deformity. Normal range of motion.     Cervical back: Normal range of motion and neck supple.  Skin:    General: Skin is warm and dry.     Findings: No erythema or rash.  Neurological:     Mental Status: She is alert. Mental status is at baseline.  Cranial Nerves: No cranial nerve deficit.     Coordination: Coordination normal.  Psychiatric:        Mood and Affect: Mood normal.   .   LABORATORY DATA:  I have reviewed the data as listed Lab Results  Component Value Date   WBC 5.5 07/20/2021   HGB 12.6 07/20/2021   HCT 38.0 07/20/2021   MCV 96.9 07/20/2021   PLT 388 07/20/2021   Recent Labs    07/06/21 0827 07/13/21 0843 07/20/21 0823  NA 135 132* 137  K 3.7 4.1 3.8  CL 103 99 101  CO2 22 23 26   GLUCOSE 142* 135* 175*  BUN 12 13 15   CREATININE 0.73 0.64 0.78  CALCIUM 9.6 9.4 9.6  GFRNONAA >60 >60 >60  PROT 7.3 7.7 7.9  ALBUMIN 4.1 4.2 4.4  AST 36 29 34  ALT 30 22 26   ALKPHOS 50 54 57  BILITOT 0.6 0.6 0.6    Iron/TIBC/Ferritin/ %Sat No results found for: IRON, TIBC, FERRITIN, IRONPCTSAT    RADIOGRAPHIC STUDIES: I have personally reviewed the radiological images as listed and agreed with the findings in the report. No results found.    ASSESSMENT & PLAN:  1. Tachycardia   2. Encounter for  antineoplastic chemotherapy   3. Invasive carcinoma of breast ([place])   4. Hypokalemia   5. Encounter for monitoring cardiotoxic drug therapy   Cancer Staging Invasive carcinoma of breast ([place]) Staging form: Breast, AJCC 8th Edition - Pathologic stage from 04/22/2021: Stage IA (pT1c, pN0, cM0, G2, ER+, PR+, HER2+) - Signed by Earlie Server, MD on 04/22/2021   Cancer Staging Invasive carcinoma of breast [facility]) Staging form: Breast, AJCC 8th Edition - Pathologic stage from 04/22/2021: Stage IA (pT1c, pN0, cM0, G2, ER+, PR+, HER2+) - Signed by Earlie Server, MD on 04/22/2021  #Left invasive carcinoma of breast,pT1c pN0 cM0 HER2 positive ER 11-50% positive,  PR 1-10% positive.   Labs reviewed and discussed with patient Proceed with Taxol and Herceptin after 1 L of IV fluid.  Tachycardia, will repeat echocardiogram.  Her heart rate came down to 101 after receiving 1 L of normal saline.  Encourage oral hydration. Hypokalemia, mild. K is 3.8. continue oral potassium chloride 59mq daily.    We spent sufficient time to discuss many aspect of care, questions were answered to patient's satisfaction.     All questions were answered. The patient knows to call the clinic with any problems questions or concerns.  cc AKirk Ruths MD   Follow-up in 1 week for lab MD Taxol and trastuzumab treatments. ZEarlie Server MD, PhD. 07/20/2021

## 2021-07-20 NOTE — Patient Instructions (Signed)
[facility]  Discharge Instructions: Thank you for choosing [facility] to provide your oncology and hematology care.  If you have a lab appointment with the [facility], please go directly to the [facility] and check in at the registration area.  Wear comfortable clothing and clothing appropriate for easy access to any Portacath or PICC line.   We strive to give you quality time with your provider. You may need to reschedule your appointment if you arrive late (15 or more minutes).  Arriving late affects you and other patients whose appointments are after yours.  Also, if you miss three or more appointments without notifying the office, you may be dismissed from the clinic at the provider's discretion.      For prescription refill requests, have your pharmacy contact our office and allow 72 hours for refills to be completed.    Today you received the following chemotherapy and/or immunotherapy agents OPDIVO and TAXOL      To help prevent nausea and vomiting after your treatment, we encourage you to take your nausea medication as directed.  BELOW ARE SYMPTOMS THAT SHOULD BE REPORTED IMMEDIATELY: *FEVER GREATER THAN 100.4 F (38 C) OR HIGHER *CHILLS OR SWEATING *NAUSEA AND VOMITING THAT IS NOT CONTROLLED WITH YOUR NAUSEA MEDICATION *UNUSUAL SHORTNESS OF BREATH *UNUSUAL BRUISING OR BLEEDING *URINARY PROBLEMS (pain or burning when urinating, or frequent urination) *BOWEL PROBLEMS (unusual diarrhea, constipation, pain near the anus) TENDERNESS IN MOUTH AND THROAT WITH OR WITHOUT PRESENCE OF ULCERS (sore throat, sores in mouth, or a toothache) UNUSUAL RASH, SWELLING OR PAIN  UNUSUAL VAGINAL DISCHARGE OR ITCHING   Items with * indicate a potential emergency and should be followed up as soon as possible or go to the Emergency Department if any problems should occur.  Please show the CHEMOTHERAPY ALERT CARD or IMMUNOTHERAPY ALERT CARD at  check-in to the Emergency Department and triage nurse.  Should you have questions after your visit or need to cancel or reschedule your appointment, please contact [facility]  [phone] and follow the prompts.  Office hours are 8:00 a.m. to 4:30 p.m. Monday - Friday. Please note that voicemails left after 4:00 p.m. may not be returned until the following business day.  We are closed weekends and major holidays. You have access to a nurse at all times for urgent questions. Please call the main number to the clinic [phone] and follow the prompts.  For any non-urgent questions, you may also contact your provider using MyChart. We now offer e-Visits for anyone 34 and older to request care online for non-urgent symptoms. For details visit [email or web address].   Also download the MyChart app! Go to the app store, search "MyChart", open the app, select [facility], and log in with your MyChart username and password.  Due to Covid, a mask is required upon entering the hospital/clinic. If you do not have a mask, one will be given to you upon arrival. For doctor visits, patients may have 1 support person aged 45 or older with them. For treatment visits, patients cannot have anyone with them due to current Covid guidelines and our immunocompromised population.   Nivolumab injection ?y l thu?c g? NIVOLUMAB l m?t khng th? ??n dng. Thu?c ???c s? d?ng ?? ?i?u tr? ung th? ru?t gi, ung th? th?c qu?n, ung th? ??u v c?, b??u b?ch huy?t [place], ung th? th?n, ung th? gan, ung th? ph?i, b??u trung bi?u m (mesothelioma), b??u h?c  t? v ung th? ni?u b (urothelial cancer). Thu?c ny c th? ???c dng cho nh?ng m?c ?ch khc; hy h?i ng??i cung c?p d?ch v? y t? ho?c d??c s? c?a mnh, n?u qu v? c th?c m?c. (CC) NHN HI?U PH? BI?N: Opdivo Ti c?n ph?i bo cho ng??i cung c?p d?ch v? y t? c?a mnh ?i?u g tr??c khi dng thu?c ny? H? c?n bi?t li?u qu v? c b?t k? tnh  tr?ng no sau ?y khng: cc b?nh t? mi?n, ch?ng h?n nh? b?nh [place], vim lot ru?t gi, ho?c b?nh lupus ? c?y ghp ho?c c k? ho?ch c?y ghp t? bo g?c d? sinh (s? d?ng t? bo g?c c?a ng??i khc) ti?n s? x? tr? ng?c c ti?n s? ???c ghp t?ng cc v?n ?? c?a h? th?n kinh, ch?ng h?n nh? b?nh nh??c c? n?ng ho?c h?i ch?ng Guillain-Barre pha?n ??ng b?t th???ng ho??c di? ??ng v??i nivolumab pha?n ??ng b?t th???ng ho??c di? ??ng v??i ca?c d??c ph?m kha?c pha?n ??ng b?t th???ng ho??c di? ??ng v??i th??c ph?m, thu?c nhu?m, ho??c ch?t ba?o qua?n ?ang c thai ho??c ??nh co? thai ?ang cho con bu? Ti nn s? d?ng thu?c ny nh? th? no? Thu?c ny ?? truy?n vo t?nh m?ch. Thu?c ny ???c s? d?ng b?i chuyn vin y t? ? b?nh vi?n ho?c ? phng m?ch. Qu v? s? ???c nh?n m?t B?n H??ng D?n v? D??c Ph?m (MedGuide) tr??c m?i l?n ?i?u tr?. Hy b?o ??m ??c k? thng tin ny m?i l?n. Hy bn v?i bc s? nhi khoa c?a qu v? v? vi?c dng thu?c ny ? tr? em. Thu?c ny c th? ???c k toa cho tr? em ch? m?i 12 tu?i trong nh?ng tr??ng h?p ch?n l?c, nh?ng c?n ph?i th?n tr?ng. Qu li?u: N?u qu v? cho r?ng mnh ? dng qu nhi?u thu?c ny, th hy lin l?c v?i trung tm ki?m sot ch?t ??c ho?c phng c?p c?u ngay l?p t?c. L?U : Thu?c ny ch? dnh ring cho qu v?. Khng chia s? thu?c ny v?i nh?ng ng??i khc. N?u ti l? qun m?t li?u th sao? ?i?u quan tr?ng l khng nn b? l? li?u thu?c no. Hy lin l?c v?i bc s? ho?c [place] vin y t? c?a mnh, n?u qu v? khng th? gi? ?ng cu?c h?n khm. Nh?ng g c th? t??ng tc v?i thu?c ny? Ch?a co? nghin c??u v? t??ng ta?c thu?c. Danh sch ny c th? khng m t? ?? h?t cc t??ng tc c th? x?y ra. Hy ??a cho ng??i cung c?p d?ch v? y t? c?a mnh danh sch t?t c? cc thu?c, th?o d??c, cc thu?c khng c?n toa, ho?c cc ch? ph?m b? sung m qu v? dng. C?ng nn bo cho h? bi?t r?ng qu v? c ht thu?c, u?ng r??u, ho?c c s? d?ng ma ty tri php hay khng. Vi th? c th? t??ng tc  v?i thu?c c?a qu v?. Ti c?n ph?i theo di ?i?u g trong khi dng thu?c ny? Thu?c ny c th? lm cho qu v? c?m th?y khng ???c kh?e nh? th??ng l?. Hy ti?p t?c ??t ?i?u tr? c?a mnh ngay c? khi qu v? c?m th?y m?t, tr? khi bc s? yu c?u qu v? ng?ng ?i?u tr?Sander Nephew v? s? c?n ?i lm cc xt nghi?m mu ??nh k? trong khi qu v? dng thu?c ny. Khng ???c c thai trong th?i gian dng thu?c ny ho?c trong vng 5 thng sau khi ng?ng dng thu?c. Ph? n? c?n ph?i thng bo cho  bc s? c?a mnh, n?u mu?n c thai ho?c ngh? r?ng c th? mnh ? c [place]. C nguy c? v? cc tc d?ng ph? nghim tr?ng ??i v?i [place] nhi. Hy th?o lu?n v?i bc s? ho?c chuyn vin y t? ho?c d??c s? ?? bi?t thm thng tin. Khng ???c cho tr? b m? khi ?ang dng thu?c ny ho?c trong vng 5 thng sau khi ng?ng dng thu?c. Ti c th? nh?n th?y nh?ng tc d?ng ph? no khi dng thu?c ny? Nh?ng tc d?ng ph? qu v? c?n ph?i bo cho bc s? ho?c chuyn vin y t? cng s?m cng t?t: cc ph?n ?ng d? ?ng, ch?ng h?n nh? da b? m?n ??, ng?a, n?i my ?ay, s?ng ? m?t, mi, ho?c l??i kh th? mu trong n??c ti?u b? tiu ch?y x?i x? hay c mu ho?c phn c mu ?en, h?c n cc thay ??i c?m xc ho?c tm tr?ng thay ??i th? l?c ?au ng?c ho chng m?t c?m th?y chong vng, ng?t x?u, b? t s?t ho?c ?n l?nh b? ?au ??u km v?i s?t, c?ng c?, l l?n, m?t tr nh?, nh?y c?m v?i nh sng, ?o gic, m?t lin h? v?i th?c t?i, ho?c co gi?t ?au ho?c nh?c kh?p lot mi?ng m?n ??, r?p da, bong ho?c trc da, bao g?m bn trong mi?ng. b? y?u hay ?au c? tr?m tr?ng cc d?u hi?u v tri?u ch?ng ???ng huy?t cao nh? chng m?t; kh mi?ng; kh da; h?i th? c mi tri cy; bu?n i; ?au bao t?; ?i ho?t kht h?n; ?i ti?u nhi?u h?n cc d?u hi?u v tri?u ch?ng t?n th??ng th?n, nh? kh ?i ti?u ho?c thay ??i l??ng n??c ti?u cc d?u hi?u v tri?u ch?ng t?n th??ng gan, ch?ng h?n nh? n??c ti?u mu nu ho?c vng s?m; c?m gic b? b?nh ki?u chung chung ho?c cc tri?u ch?ng gi?ng nh? cm; phn  b?c mu; m?t c?m gic ngon mi?ng; bu?n i; ?au vng b?ng trn; y?u ?t ho?c m?t m?i khc th??ng; vng da ho?c m?t s?ng ? m?t c chn, bn chn, ho?c bn tay kh ?i ti?u ho?c thay ??i l??ng n??c ti?u ???c bi ti?t m?t m?i ho?c y?u ?t b?t th??ng t?ng cn ho?c s?t cn Cc tc d?ng ph? khng c?n ph?i ch?m [place] y t? (hy bo cho bc s? ho?c chuyn vin y t?, n?u cc tc d?ng ph? ny ti?p di?n ho?c gy phi?n toi): ?au x??ng to bn m?t c?m gic ngon mi?ng tiu ch?y ?au ho?c nh?c c? b?p bu?n i ho?c i m?a c?m th?y m?t m?i Danh sch ny c th? khng m t? ?? h?t cc tc d?ng ph? c th? x?y ra. Xin g?i t?i bc s? c?a mnh ?? ???c c? v?n chuyn mn v? cc tc d?ng ph?Sander Nephew v? c th? t??ng trnh cc tc d?ng ph? cho FDA theo s? [phone]. Ti nn c?t gi? thu?c c?a mnh ? ?u? Thu?c ny ???c s? d?ng b?i chuyn vin y t? ? b?nh vi?n ho?c ? phng m?ch. Qu v? s? khng ???c c?p thu?c ny ?? c?t gi? t?i nh. L?U : ?y l b?n tm t?t. N c th? khng bao hm t?t c? thng tin c th? c. N?u qu v? th?c m?c v? thu?c ny, xin trao ??i v?i bc s?, d??c s?, ho?c ng??i cung c?p d?ch v? y t? c?a mnh.  2022 Elsevier/Gold Standard (2020-01-03 00:00:00)  Paclitaxel injection ?y l thu?c g? PACLITAXEL l thu?c ha tr? li?u. N nh?m ??n cc t? bo phn  chia nhanh, nh? cc t? bo ung th?, v lm cho cc t? bo ? ch?t. Thu?c ny ???c dng ?? ?i?u tr? ung th? bu?ng tr?ng, ung th? v, ung th? ph?i, ung th? Kaposi (Kaposi's sarcoma) v cc b?nh ung th? khc. Thu?c ny c th? ???c dng cho nh?ng m?c ?ch khc; hy h?i ng??i cung c?p d?ch v? y t? ho?c d??c s? c?a mnh, n?u qu v? c th?c m?c. (CC) NHN HI?U PH? BI?N: Onxol, Taxol Ti c?n ph?i bo cho ng??i cung c?p d?ch v? y t? c?a mnh ?i?u g tr??c khi dng thu?c ny? H? c?n bi?t li?u qu v? c b?t k? tnh tr?ng no sau ?y khng: ti?n s? tim ??p khng ??u b?nh gan s? l??ng t? bo mu th?p, ch?ng h?n nh? s? l??ng b?ch c?u, ti?u c?u, ho?c h?ng c?u th?p b?nh  ph?i ho??c h h?p, ch??ng ha?n nh? hen suy?n c?m gic nh? b? ki?n b ? ngn tay ho?c ngn chn, ho?c cc r?i lo?n th?n kinh khc c pha?n ??ng b?t th???ng ho??c di? ??ng v??i paclitaxel, c?n, d?u castor nh? ha (polyoxyethylated) ho?c ha tr? li?u khc pha?n ??ng b?t th???ng ho??c di? ??ng v??i ca?c d??c ph?m kha?c, th?c ph?m, thu?c nhu?m, ho??c ch?t ba?o qua?n ?ang c thai ho??c ??nh co? thai ?ang cho con bu? Ti nn s? d?ng thu?c ny nh? th? no? Thu?c ny ?? truy?n vo t?nh m?ch. Thu?c ny ???c cho trong b?nh vi?n ho?c phng m?ch b?i chuyn vin y t? ???c hu?n luy?n ??c bi?t. Hy bn v?i bc s? nhi khoa c?a qu v? v? vi?c dng thu?c ny ? tr? em. C th? c?n ch?m [place] ??c bi?t. Qu li?u: N?u qu v? cho r?ng mnh ? dng qu nhi?u thu?c ny, th hy lin l?c v?i trung tm ki?m sot ch?t ??c ho?c phng c?p c?u ngay l?p t?c. L?U : Thu?c ny ch? dnh ring cho qu v?. Khng chia s? thu?c ny v?i nh?ng ng??i khc. N?u ti l? qun m?t li?u th sao? ?i?u quan tr?ng l khng nn b? l? li?u thu?c no. Hy lin l?c v?i bc s? ho?c [place] vin y t? c?a mnh, n?u qu v? khng th? gi? ?ng cu?c h?n khm. Nh?ng g c th? t??ng tc v?i thu?c ny? Khng ???c dng thu?c ny cng v?i b?t k? th? no sau ?y: cc thu?c ch?ng ng?a d?ng virus s?ng Thu?c ny c?ng c th? t??ng tc v?i cc thu?c sau ?y: ca?c thu?c kha?ng virus dng ?? tr? vim gan, nhi?m HIV ho?c AIDS m?t s? thu?c khng sinh, ch?ng h?n nh? erythromycin v clarithromycin m?t s? thu?c dng ?? tr? cc b?nh nhi?m n?m, ch?ng h?n nh? itraconazole, ketoconazole m?t s? thu?c dng cho cc ch?ng co gi?t, ch?ng h?n nh? carbamazepine, phenobarbital, phenytoin gemfibrozil nefazodone rifampicin cy [facility] (c? [facility]/cy n?c s?i/cy l?nh) Danh sch ny c th? khng m t? ?? h?t cc t??ng tc c th? x?y ra. Hy ??a cho ng??i cung c?p d?ch v? y t? c?a mnh danh sch t?t c? cc thu?c, th?o d??c, cc thu?c khng c?n toa, ho?c cc ch? ph?m b? sung  m qu v? dng. C?ng nn bo cho h? bi?t r?ng qu v? c ht thu?c, u?ng r??u, ho?c c s? d?ng ma ty tri php hay khng. Vi th? c th? t??ng tc v?i thu?c c?a qu v?. Ti c?n ph?i theo di ?i?u g trong khi dng thu?c ny? Qu v? s? ???c theo di ch?t ch? trong khi dng  thu?c ny. Qu v? s? c?n ph?i ?i lm cc xt nghi?m mu quan tr?ng trong th?i gian dng thu?c ny. Thu?c ny c th? gy ra nh?ng ph?n ?ng d? ?ng nghim tr?ng. ?? gi?m nguy c?, qu v? s? c?n ph?i dng (cc) thu?c khc tr??c khi ?i?u tr? b?ng thu?c ny. N?u qu v? b? cc ph?n ?ng d? ?ng, ch?ng h?n nh? da m?n ??, ng?a ho?c n?i my ?ay, s?ng ? m?t, mi ho?c l??i, th hy bo ngay cho bc s? ho?c [place] vin y t? c?a mnh. Trong m?t s? tr??ng h?p, qu v? c th? ???c cho dng cc thu?c ph? thm ?? gip gi?m tc d?ng ph?. Hy lm theo t?t c? cc h??ng d?n v? vi?c s? d?ng chng. Thu?c ny c th? lm cho qu v? c?m th?y khng ???c kh?e nh? th??ng l?. ?i?u ny khng ph?i khng ph? bi?n, b?i v thu?c ha tr? li?u c th? ?nh h??ng ??n c? t? bo lnh l?n t? bo ung th?. Hy t??ng trnh m?i tc d?ng ph?. Hy ti?p t?c ??t ?i?u tr? c?a mnh ngay c? khi qu v? c?m th?y m?t, tr? khi bc s? yu c?u qu v? ng?ng ?i?u tr?. Hy h?i  ki?n bc s? ho?c chuyn vin y t?, n?u qu v? b? s?t, ?n l?nh ho?c ?au h?ng, ho?c c cc tri?u ch?ng khc c?a c?m l?nh ho?c cm. Khng ???c t? ?i?u tr? cho mnh. Thu?c ny c th? lm gi?m kh? n?ng ch?ng l?i cc b?nh nhi?m trng c?a c? th?. Hy c? trnh ? g?n nh?ng ng??i b? b?nh. Thu?c ny c th? lm t?ng nguy c? b? b?m tm ho?c ch?y mu. Hy lin l?c v?i bc s? ho?c chuyn vin y t?, n?u qu v? th?y ch?y mu b?t th??ng. Hy c?n th?n khi ?nh r?ng ho?c x?a r?ng b?ng ch? nha khoa ho?c b?ng t?m, b?i v qu v? c th? d? b? nhi?m trng ho?c d? b? ch?y mu h?n. N?u qu v? c ?i lm r?ng, th hy bo v?i nha s? r?ng qu v? ?ang dng thu?c ny. Trnh dng cc thu?c c ch?a aspirin, acetaminophen, ibuprofen, naproxen, ho?c ketoprofen, tr?  khi ? ???c bc s? ch? d?n. Cc thu?c ny c th? che l?p tri?u ch?ng s?t. Khng ???c ?? c thai trong khi dng thu?c ny. Ph? n? c?n ph?i thng bo cho bc s? c?a mnh, n?u mu?n c thai ho?c ngh? r?ng c th? mnh ? c [place]. C nguy c? v? cc tc d?ng ph? nghim tr?ng ??i v?i [place] nhi. Hy th?o lu?n v?i bc s? ho?c chuyn vin y t? ho?c d??c s? ?? bi?t thm thng tin. Khng ???c nui con b?ng s?a m? trong khi dng thu?c ny. Nam gi?i ???c khuy?n co khng nn gy th? [place] trong khi dng thu?c ny. Ch? ph?m ny c th? c ch?a r??u. Hy h?i d??c s? ho?c chuyn vin y t? c?a mnh xem thu?c ny c ch?a r??u hay khng. Hy ch?c ch?n bo cho t?t c? cc chuyn vin y t? c?a mnh bi?t r?ng qu v? ?ang dng thu?c ny. M?t s? thu?c, ch?ng h?n nh? metronidazole v disulfiram, c th? gy ra ph?n ?ng kh ch?u khi ???c u?ng cng v?i r??u. Ph?n ?ng ny bao g?m tnh tr?ng ph?ng m?t, ?au ??u, bu?n i, i, v m? hi, v kht n??c nhi?u h?n. Ph?n ?ng c th? ko di t? 30 pht ??n vi gi? ??ng h?. Ti c th? nh?n th?y nh?ng tc d?ng ph? no khi  dng thu?c ny? Nh?ng tc d?ng ph? qu v? c?n ph?i bo cho bc s? ho?c chuyn vin y t? cng s?m cng t?t: cc ph?n ?ng d? ?ng, ch?ng h?n nh? da b? m?n ??, ng?a, n?i my ?ay, s?ng ? m?t, mi, ho?c l??i kh th? thay ??i th? l?c tim ??p nhanh ho?c khng ??u huy?t p cao ho?c th?p lot mi?ng ?au, t ho?c c?m gic nh? b? ki?n b ? bn tay ho?c bn chn cc d?u hi?u gi?m ti?u c?u ho?c xu?t huy?t - b?m tm, cc n?t l?m t?m ?? trn da, phn c mu ?en, mu h?c n, c mu trong n??c ti?u cc d?u hi?u gi?m s? l??ng t? bo h?ng c?u - c?m th?y y?u ?t ho?c m?t m?i m?t cch b?t th??ng, cc c?n ng?t x?u, chong vng cc d?u hi?u nhi?m trng - s?t ho?c ?n l?nh, ho, ?au h?ng, kh ?i ti?u ho?c ?i ti?u ?au cc d?u hi?u v tri?u ch?ng t?n th??ng gan, ch?ng h?n nh? n??c ti?u mu nu ho?c vng s?m; c?m gic b? b?nh ki?u chung chung ho?c cc tri?u ch?ng gi?ng nh? cm; phn b?c mu; m?t c?m gic ngon  mi?ng; bu?n i; ?au vng b?ng trn; y?u ?t ho?c m?t m?i khc th??ng; vng da ho?c m?t s?ng ? m?t c chn, bn chn, ho?c bn tay tim ??p ch?m m?t cch b?t th??ng Cc tc d?ng ph? khng c?n ph?i ch?m [place] y t? (hy bo cho bc s? ho?c chuyn vin y t?, n?u cc tc d?ng ph? ny ti?p di?n ho?c gy phi?n toi): tiu ch?y r?ng tc m?t c?m gic ngon mi?ng ?au ? kh?p ho?c c? b?p bu?n i ho?c i m?a ?au, ??, ng?a, ho?c kch ?ng ? ch? tim c?m th?y m?t m?i Danh sch ny c th? khng m t? ?? h?t cc tc d?ng ph? c th? x?y ra. Xin g?i t?i bc s? c?a mnh ?? ???c c? v?n chuyn mn v? cc tc d?ng ph?Sander Nephew v? c th? t??ng trnh cc tc d?ng ph? cho FDA theo s? [phone]. Ti nn c?t gi? thu?c c?a mnh ? ?u? Thu?c ny ???c s? d?ng b?i chuyn vin y t? ? b?nh vi?n ho?c ? phng m?ch. Qu v? s? khng ???c c?p thu?c ny ?? c?t gi? t?i nh. L?U : ?y l b?n tm t?t. N c th? khng bao hm t?t c? thng tin c th? c. N?u qu v? th?c m?c v? thu?c ny, xin trao ??i v?i bc s?, d??c s?, ho?c ng??i cung c?p d?ch v? y t? c?a mnh.  2022 Elsevier/Gold Standard (2020-01-03 00:00:00)

## 2021-07-27 ENCOUNTER — Other Ambulatory Visit: Payer: Self-pay

## 2021-07-27 ENCOUNTER — Encounter: Payer: Self-pay | Admitting: Oncology

## 2021-07-27 ENCOUNTER — Inpatient Hospital Stay: Payer: Medicare Other

## 2021-07-27 ENCOUNTER — Inpatient Hospital Stay (HOSPITAL_BASED_OUTPATIENT_CLINIC_OR_DEPARTMENT_OTHER): Payer: Medicare Other | Admitting: Oncology

## 2021-07-27 VITALS — BP 120/68 | HR 100 | Resp 18

## 2021-07-27 VITALS — BP 128/82 | HR 98 | Temp 97.6°F | Wt 117.8 lb

## 2021-07-27 DIAGNOSIS — Z5111 Encounter for antineoplastic chemotherapy: Secondary | ICD-10-CM | POA: Diagnosis not present

## 2021-07-27 DIAGNOSIS — R000 Tachycardia, unspecified: Secondary | ICD-10-CM | POA: Diagnosis not present

## 2021-07-27 DIAGNOSIS — Z5112 Encounter for antineoplastic immunotherapy: Secondary | ICD-10-CM | POA: Diagnosis not present

## 2021-07-27 DIAGNOSIS — E876 Hypokalemia: Secondary | ICD-10-CM | POA: Diagnosis not present

## 2021-07-27 DIAGNOSIS — C50919 Malignant neoplasm of unspecified site of unspecified female breast: Secondary | ICD-10-CM | POA: Diagnosis not present

## 2021-07-27 LAB — COMPREHENSIVE METABOLIC PANEL
ALT: 24 U/L (ref 0–44)
AST: 28 U/L (ref 15–41)
Albumin: 4.2 g/dL (ref 3.5–5.0)
Alkaline Phosphatase: 55 U/L (ref 38–126)
Anion gap: 8 (ref 5–15)
BUN: 12 mg/dL (ref 6–20)
CO2: 27 mmol/L (ref 22–32)
Calcium: 9.4 mg/dL (ref 8.9–10.3)
Chloride: 101 mmol/L (ref 98–111)
Creatinine, Ser: 0.62 mg/dL (ref 0.44–1.00)
GFR, Estimated: >60 mL/min (ref >60)
Glucose, Bld: 102 mg/dL — ABNORMAL HIGH (ref 70–99)
Potassium: 3.2 mmol/L — ABNORMAL LOW (ref 3.5–5.1)
Sodium: 136 mmol/L (ref 135–145)
Total Bilirubin: 0.3 mg/dL (ref 0.3–1.2)
Total Protein: 7.6 g/dL (ref 6.5–8.1)

## 2021-07-27 LAB — CBC WITH DIFFERENTIAL/PLATELET
Abs Immature Granulocytes: 0.04 10*3/uL (ref 0.00–0.07)
Basophils Absolute: 0 10*3/uL (ref 0.0–0.1)
Basophils Relative: 1 %
Eosinophils Absolute: 0.3 10*3/uL (ref 0.0–0.5)
Eosinophils Relative: 6 %
HCT: 37 % (ref 36.0–46.0)
Hemoglobin: 12.2 g/dL (ref 12.0–15.0)
Immature Granulocytes: 1 %
Lymphocytes Relative: 36 %
Lymphs Abs: 1.9 10*3/uL (ref 0.7–4.0)
MCH: 31.9 pg (ref 26.0–34.0)
MCHC: 33 g/dL (ref 30.0–36.0)
MCV: 96.6 fL (ref 80.0–100.0)
Monocytes Absolute: 0.5 10*3/uL (ref 0.1–1.0)
Monocytes Relative: 10 %
Neutro Abs: 2.4 10*3/uL (ref 1.7–7.7)
Neutrophils Relative %: 46 %
Platelets: 375 10*3/uL (ref 150–400)
RBC: 3.83 MIL/uL — ABNORMAL LOW (ref 3.87–5.11)
RDW: 13 % (ref 11.5–15.5)
WBC: 5.2 10*3/uL (ref 4.0–10.5)
nRBC: 0 % (ref 0.0–0.2)

## 2021-07-27 LAB — PREGNANCY, URINE: Preg Test, Ur: NEGATIVE

## 2021-07-27 MED ORDER — POTASSIUM CHLORIDE CRYS ER 20 MEQ PO TBCR: 20.0000 meq | EXTENDED_RELEASE_TABLET | Freq: Every day | ORAL | 0 refills | Status: DC

## 2021-07-27 MED ORDER — FAMOTIDINE 20 MG IN NS 100 ML IVPB
20.0000 mg | Freq: Once | INTRAVENOUS | Status: AC
  Administered 2021-07-27: 20 mg via INTRAVENOUS
  Filled 2021-07-27: qty 20

## 2021-07-27 MED ORDER — DIPHENHYDRAMINE HCL 50 MG/ML IJ SOLN
50.0000 mg | Freq: Once | INTRAMUSCULAR | Status: AC
  Administered 2021-07-27: 50 mg via INTRAVENOUS
  Filled 2021-07-27: qty 1

## 2021-07-27 MED ORDER — ACETAMINOPHEN 325 MG PO TABS
650.0000 mg | ORAL_TABLET | Freq: Once | ORAL | Status: AC
  Administered 2021-07-27: 650 mg via ORAL
  Filled 2021-07-27: qty 2

## 2021-07-27 MED ORDER — SODIUM CHLORIDE 0.9 % IV SOLN
10.0000 mg | Freq: Once | INTRAVENOUS | Status: AC
  Administered 2021-07-27: 10 mg via INTRAVENOUS
  Filled 2021-07-27: qty 10

## 2021-07-27 MED ORDER — POTASSIUM CHLORIDE 10 MEQ/100ML IV SOLN
10.0000 meq | Freq: Once | INTRAVENOUS | Status: AC
  Administered 2021-07-27: 10 meq via INTRAVENOUS
  Filled 2021-07-27: qty 100

## 2021-07-27 MED ORDER — TRASTUZUMAB-DKST CHEMO 150 MG IV SOLR
2.0000 mg/kg | Freq: Once | INTRAVENOUS | Status: AC
  Administered 2021-07-27: 105 mg via INTRAVENOUS
  Filled 2021-07-27: qty 5

## 2021-07-27 MED ORDER — SODIUM CHLORIDE 0.9 % IV SOLN
Freq: Once | INTRAVENOUS | Status: AC
  Filled 2021-07-27: qty 250

## 2021-07-27 MED ORDER — SODIUM CHLORIDE 0.9 % IV SOLN
80.0000 mg/m2 | Freq: Once | INTRAVENOUS | Status: AC
  Administered 2021-07-27: 120 mg via INTRAVENOUS
  Filled 2021-07-27: qty 20

## 2021-07-27 NOTE — Progress Notes (Signed)
Hematology/Oncology follow up  note [phone] [phone]   Patient Care Team: Kirk Ruths, MD as PCP - General (Internal Medicine)  REFERRING PROVIDER: Kirk Ruths, MD  CHIEF COMPLAINTS/REASON FOR VISIT:  Follow up for treatment of breast cancer  HISTORY OF PRESENTING ILLNESS:   Anna Banks is a  45 y.o.  female with PMH listed below was seen in consultation at the request of  Kirk Ruths, MD  for evaluation of breast cancer.   Patient was accompanied by her half sister today. Per sister, patient has history of seizure since childhood and has intellectual/cognitive impairment. She is illiterate. She follows instructions from her family members.  She lives with her mother and her half sister who takes of her and helps her to make medical decision.   They waived interpretor. Both patient and her half sister speak [place].  Menarche 38 years of age. G0P0. No child, no previous pregnancy. Per her sister, patient is not sexually active No prior use of birth control pills or hormone replacement therapy. Denies previous history of biopsy.  02/16/2021, screening mammogram showed calcification in the right breast needs further evaluation.  Left breast possible mass with distortion requires further evaluation. 02/26/2021, left breast showed a 10 mm indeterminate mass, 2:00, 8 cm from nipple.  No mammographic evidence of malignancy in the right breast.  No left axillary lymphadenopathy. 03/10/2021, patient underwent ultrasound-guided biopsy of left breast mass.  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  ER low positive [1-10%], PR positive [1-10%], HER2 positive by IHC 3+  # 04/09/2021, status postlumpectomy and sentinel lymph node biopsy. Invasive mammary carcinoma, no special type, 4 lymph nodes all negative for metastatic carcinoma.  DCIS present.  LVI not  identified.Grade 2, all margins negative for invasive carcinoma. pT1c pN0, ER[staining was repeated on surgical specimen] [11-50%]+ PR [1-10%]+, HER 2+  Baseline echocardiogram showed normal LVEF.  INTERVAL HISTORY Anna Banks is a 45 y.o. female who has above history reviewed by me today presents for follow up visit for chemotherapy for HER2 positive breast cancer  Patient is here by herself today. [place] interpreter online service was used. + hair loss + rash on her scalp, non itchy, improved.   Patient denies any nausea vomiting diarrhea. No shortness of breath, leg swelling. .   Review of Systems  Unable to perform ROS: Other (intellectual/Cognitive impairment)  Constitutional:  Negative for appetite change, chills, fatigue and fever.  HENT:   Negative for hearing loss and voice change.   Eyes:  Negative for eye problems.  Respiratory:  Negative for chest tightness and cough.   Cardiovascular:  Negative for chest pain.  Gastrointestinal:  Negative for abdominal distention, abdominal pain and blood in stool.  Endocrine: Negative for hot flashes.  Genitourinary:  Negative for difficulty urinating and frequency.   Musculoskeletal:  Negative for arthralgias.  Skin:  Negative for itching and rash.       + hair loss  Neurological:  Negative for extremity weakness.  Hematological:  Negative for adenopathy.  Psychiatric/Behavioral:  Negative for confusion.    MEDICAL HISTORY:  Past Medical History:  Diagnosis Date   Down syndrome    Seizure [facility])     SURGICAL HISTORY: Past Surgical History:  Procedure Laterality Date   BREAST BIOPSY Left 03/10/2021   [place] BX,ribbon clip, IMC   BREAST LUMPECTOMY,RADIO FREQ LOCALIZER,AXILLARY SENTINEL LYMPH NODE BIOPSY Left 04/09/2021   Procedure:  BREAST LUMPECTOMY,RADIO FREQ LOCALIZER,AXILLARY SENTINEL LYMPH NODE BIOPSY;  Surgeon: Benjamine Sprague, DO;  Location: [facility] ORS;  Service: General;  Laterality: Left;    SOCIAL HISTORY: Social  History   Socioeconomic History   Marital status: Single    Spouse name: Not on file   Number of children: Not on file   Years of education: Not on file   Highest education level: Not on file  Occupational History   Not on file  Tobacco Use   Smoking status: Never   Smokeless tobacco: Never  Vaping Use   Vaping Use: Never used  Substance and Sexual Activity   Alcohol use: Never   Drug use: Not Currently   Sexual activity: Not on file  Other Topics Concern   Not on file  Social History Narrative   Not on file   Social Determinants of Health   Financial Resource Strain: Not on file  Food Insecurity: Not on file  Transportation Needs: Not on file  Physical Activity: Not on file  Stress: Not on file  Social Connections: Not on file  Intimate Partner Violence: Not on file    FAMILY HISTORY: History reviewed. No pertinent family history.  ALLERGIES:  has No Known Allergies.  MEDICATIONS:  Current Outpatient Medications  Medication Sig Dispense Refill   acetaminophen (TYLENOL) 325 MG tablet Take 325-650 mg by mouth every 6 (six) hours as needed for moderate pain.     ethosuximide (ZARONTIN) 250 MG capsule Take 250 mg by mouth 2 (two) times daily.     potassium chloride SA (KLOR-CON) 20 MEQ tablet Take 1 tablet (20 mEq total) by mouth daily. 30 tablet 0   VITAMIN D PO Take 1 capsule by mouth daily.     HYDROcodone-acetaminophen (NORCO) 5-325 MG tablet Take 1 tablet by mouth every 6 (six) hours as needed for up to 6 doses for moderate pain. (Patient not taking: No sig reported) 6 tablet 0   ondansetron (ZOFRAN) 8 MG tablet Take 1 tablet (8 mg total) by mouth 2 (two) times daily as needed (Nausea or vomiting). (Patient not taking: No sig reported) 30 tablet 1   No current facility-administered medications for this visit.     PHYSICAL EXAMINATION: ECOG PERFORMANCE STATUS: 0 - Asymptomatic Vitals:   07/27/21 0924  BP: 128/82  Pulse: 98  Temp: 97.6 F (36.4 C)    Filed Weights   07/27/21 0924  Weight: 117 lb 12.8 oz (53.4 kg)    Physical Exam Constitutional:      General: She is not in acute distress. HENT:     Head: Normocephalic and atraumatic.  Eyes:     General: No scleral icterus. Cardiovascular:     Rate and Rhythm: Regular rhythm.     Heart sounds: Normal heart sounds.  Pulmonary:     Effort: Pulmonary effort is normal. No respiratory distress.     Breath sounds: No wheezing.  Abdominal:     General: Bowel sounds are normal. There is no distension.     Palpations: Abdomen is soft.  Musculoskeletal:        General: No deformity. Normal range of motion.     Cervical back: Normal range of motion and neck supple.  Skin:    General: Skin is warm and dry.     Findings: No erythema or rash.  Neurological:     Mental Status: She is alert. Mental status is at baseline.     Cranial Nerves: No cranial nerve deficit.  Coordination: Coordination normal.  Psychiatric:        Mood and Affect: Mood normal.   .   LABORATORY DATA:  I have reviewed the data as listed Lab Results  Component Value Date   WBC 5.2 07/27/2021   HGB 12.2 07/27/2021   HCT 37.0 07/27/2021   MCV 96.6 07/27/2021   PLT 375 07/27/2021   Recent Labs    07/13/21 0843 07/20/21 0823 07/27/21 0845  NA 132* 137 136  K 4.1 3.8 3.2*  CL 99 101 101  CO2 23 26 27   GLUCOSE 135* 175* 102*  BUN 13 15 12   CREATININE 0.64 0.78 0.62  CALCIUM 9.4 9.6 9.4  GFRNONAA >60 >60 >60  PROT 7.7 7.9 7.6  ALBUMIN 4.2 4.4 4.2  AST 29 34 28  ALT 22 26 24   ALKPHOS 54 57 55  BILITOT 0.6 0.6 0.3    Iron/TIBC/Ferritin/ %Sat No results found for: IRON, TIBC, FERRITIN, IRONPCTSAT    RADIOGRAPHIC STUDIES: I have personally reviewed the radiological images as listed and agreed with the findings in the report. No results found.    ASSESSMENT & PLAN:  1. Encounter for antineoplastic chemotherapy   2. Invasive carcinoma of breast ([place])   3. Hypokalemia   4.  Tachycardia   Cancer Staging Invasive carcinoma of breast ([place]) Staging form: Breast, AJCC 8th Edition - Pathologic stage from 04/22/2021: Stage IA (pT1c, pN0, cM0, G2, ER+, PR+, HER2+) - Signed by Earlie Server, MD on 04/22/2021   Cancer Staging Invasive carcinoma of breast [facility]) Staging form: Breast, AJCC 8th Edition - Pathologic stage from 04/22/2021: Stage IA (pT1c, pN0, cM0, G2, ER+, PR+, HER2+) - Signed by Earlie Server, MD on 04/22/2021  #Left invasive carcinoma of breast,pT1c pN0 cM0 HER2 positive ER 11-50% positive,  PR 1-10% positive.   Labs are reviewed and discussed with patient. Proceed with Taxol and trastuzumab.  Tachycardia, improved today patient has echocardiogram scheduled.  Hypokalemia, mild. K is 3.2. continue oral potassium chloride, advised patient to increase potassium chloride to 20 M EQ daily.  Prescription was sent to pharmacy. Proceed with IV potassium 10 meq x 1 today  We spent sufficient time to discuss many aspect of care, questions were answered to patient's satisfaction.   All questions were answered. The patient knows to call the clinic with any problems questions or concerns.  cc Kirk Ruths, MD   Follow-up in 1 week for lab NP Taxol and trastuzumab treatments. 2 weeks lab MD trastuzumab maintenance.  Earlie Server, MD, PhD. 07/27/2021

## 2021-07-27 NOTE — Progress Notes (Signed)
Patient here for follow up. Patient voices no concerns.

## 2021-07-27 NOTE — Patient Instructions (Signed)
[facility]ENTER [place] [facility]  Discharge Instructions: Thank you for choosing [facility] to provide your oncology and hematology care.  If you have a lab appointment with the Cancer Center, please go directly to the Cancer Center and check in at the registration area.  Wear comfortable clothing and clothing appropriate for easy access to any Portacath or PICC line.   We strive to give you quality time with your provider. You may need to reschedule your appointment if you arrive late (15 or more minutes).  Arriving late affects you and other patients whose appointments are after yours.  Also, if you miss three or more appointments without notifying the office, you may be dismissed from the clinic at the provider's discretion.      For prescription refill requests, have your pharmacy contact our office and allow 72 hours for refills to be completed.    Today you received the following chemotherapy and/or immunotherapy agents       To help prevent nausea and vomiting after your treatment, we encourage you to take your nausea medication as directed.  BELOW ARE SYMPTOMS THAT SHOULD BE REPORTED IMMEDIATELY: *FEVER GREATER THAN 100.4 F (38 C) OR HIGHER *CHILLS OR SWEATING *NAUSEA AND VOMITING THAT IS NOT CONTROLLED WITH YOUR NAUSEA MEDICATION *UNUSUAL SHORTNESS OF BREATH *UNUSUAL BRUISING OR BLEEDING *URINARY PROBLEMS (pain or burning when urinating, or frequent urination) *BOWEL PROBLEMS (unusual diarrhea, constipation, pain near the anus) TENDERNESS IN MOUTH AND THROAT WITH OR WITHOUT PRESENCE OF ULCERS (sore throat, sores in mouth, or a toothache) UNUSUAL RASH, SWELLING OR PAIN  UNUSUAL VAGINAL DISCHARGE OR ITCHING   Items with * indicate a potential emergency and should be followed up as soon as possible or go to the Emergency Department if any problems should occur.  Please show the CHEMOTHERAPY ALERT CARD or IMMUNOTHERAPY ALERT CARD at check-in to the  Emergency Department and triage nurse.  Should you have questions after your visit or need to cancel or reschedule your appointment, please contact [facility] [place] [facility]  [phone] and follow the prompts.  Office hours are 8:00 a.m. to 4:30 p.m. Monday - Friday. Please note that voicemails left after 4:00 p.m. may not be returned until the following business day.  We are closed weekends and major holidays. You have access to a nurse at all times for urgent questions. Please call the main number to the clinic [phone] and follow the prompts.  For any non-urgent questions, you may also contact your provider using MyChart. We now offer e-Visits for anyone 18 and older to request care online for non-urgent symptoms. For details visit [email or web address].   Also download the MyChart app! Go to the app store, search "MyChart", open the app, select [facility], and log in with your MyChart username and password.  Due to Covid, a mask is required upon entering the hospital/clinic. If you do not have a mask, one will be given to you upon arrival. For doctor visits, patients may have 1 support person aged 18 or older with them. For treatment visits, patients cannot have anyone with them due to current Covid guidelines and our immunocompromised population.  

## 2021-07-28 ENCOUNTER — Ambulatory Visit
Admission: RE | Admit: 2021-07-28 | Discharge: 2021-07-28 | Disposition: A | Discharge status: Home / Self Care | Payer: Medicare Other | Source: Ambulatory Visit | Attending: Oncology | Admitting: Oncology

## 2021-07-28 DIAGNOSIS — C50919 Malignant neoplasm of unspecified site of unspecified female breast: Secondary | ICD-10-CM

## 2021-07-28 DIAGNOSIS — Z0189 Encounter for other specified special examinations: Secondary | ICD-10-CM

## 2021-07-28 DIAGNOSIS — Z79899 Other long term (current) drug therapy: Secondary | ICD-10-CM | POA: Diagnosis not present

## 2021-07-28 DIAGNOSIS — Z01818 Encounter for other preprocedural examination: Secondary | ICD-10-CM | POA: Diagnosis present

## 2021-07-28 DIAGNOSIS — Z5181 Encounter for therapeutic drug level monitoring: Secondary | ICD-10-CM

## 2021-07-28 DIAGNOSIS — Z5111 Encounter for antineoplastic chemotherapy: Secondary | ICD-10-CM | POA: Diagnosis not present

## 2021-07-28 DIAGNOSIS — R000 Tachycardia, unspecified: Secondary | ICD-10-CM | POA: Diagnosis not present

## 2021-07-28 LAB — ECHOCARDIOGRAM COMPLETE
AR max vel: 2.26 cm2
AV Area VTI: 2.15 cm2
AV Area mean vel: 2.32 cm2
AV Mean grad: 4 mmHg
AV Peak grad: 8.1 mmHg
Ao pk vel: 1.42 m/s
Area-P 1/2: 4.83 cm2
MV VTI: 2.19 cm2
S' Lateral: 2.6 cm

## 2021-07-28 NOTE — Progress Notes (Signed)
*  PRELIMINARY RESULTS* Echocardiogram 2D Echocardiogram has been performed.  Anna Banks Anna Banks 07/28/2021, 9:49 AM

## 2021-08-03 ENCOUNTER — Inpatient Hospital Stay: Payer: Medicare Other

## 2021-08-03 ENCOUNTER — Inpatient Hospital Stay (HOSPITAL_BASED_OUTPATIENT_CLINIC_OR_DEPARTMENT_OTHER): Payer: Medicare Other | Admitting: Nurse Practitioner

## 2021-08-03 ENCOUNTER — Other Ambulatory Visit: Payer: Self-pay

## 2021-08-03 ENCOUNTER — Encounter: Payer: Self-pay | Admitting: Nurse Practitioner

## 2021-08-03 VITALS — BP 130/80 | HR 101 | Temp 98.2°F | Wt 115.0 lb

## 2021-08-03 DIAGNOSIS — Z5181 Encounter for therapeutic drug level monitoring: Secondary | ICD-10-CM

## 2021-08-03 DIAGNOSIS — Z79899 Other long term (current) drug therapy: Secondary | ICD-10-CM

## 2021-08-03 DIAGNOSIS — C50919 Malignant neoplasm of unspecified site of unspecified female breast: Secondary | ICD-10-CM

## 2021-08-03 DIAGNOSIS — Z5112 Encounter for antineoplastic immunotherapy: Secondary | ICD-10-CM | POA: Diagnosis not present

## 2021-08-03 DIAGNOSIS — Z5111 Encounter for antineoplastic chemotherapy: Secondary | ICD-10-CM | POA: Diagnosis not present

## 2021-08-03 LAB — COMPREHENSIVE METABOLIC PANEL
ALT: 20 U/L (ref 0–44)
AST: 25 U/L (ref 15–41)
Albumin: 4 g/dL (ref 3.5–5.0)
Alkaline Phosphatase: 56 U/L (ref 38–126)
Anion gap: 8 (ref 5–15)
BUN: 15 mg/dL (ref 6–20)
CO2: 25 mmol/L (ref 22–32)
Calcium: 9.6 mg/dL (ref 8.9–10.3)
Chloride: 103 mmol/L (ref 98–111)
Creatinine, Ser: 0.66 mg/dL (ref 0.44–1.00)
GFR, Estimated: >60 mL/min (ref >60)
Glucose, Bld: 159 mg/dL — ABNORMAL HIGH (ref 70–99)
Potassium: 3.7 mmol/L (ref 3.5–5.1)
Sodium: 136 mmol/L (ref 135–145)
Total Bilirubin: 0.3 mg/dL (ref 0.3–1.2)
Total Protein: 7.4 g/dL (ref 6.5–8.1)

## 2021-08-03 LAB — CBC WITH DIFFERENTIAL/PLATELET
Abs Immature Granulocytes: 0.04 10*3/uL (ref 0.00–0.07)
Basophils Absolute: 0.1 10*3/uL (ref 0.0–0.1)
Basophils Relative: 1 %
Eosinophils Absolute: 0.3 10*3/uL (ref 0.0–0.5)
Eosinophils Relative: 5 %
HCT: 37.6 % (ref 36.0–46.0)
Hemoglobin: 12.3 g/dL (ref 12.0–15.0)
Immature Granulocytes: 1 %
Lymphocytes Relative: 32 %
Lymphs Abs: 1.7 10*3/uL (ref 0.7–4.0)
MCH: 32 pg (ref 26.0–34.0)
MCHC: 32.7 g/dL (ref 30.0–36.0)
MCV: 97.9 fL (ref 80.0–100.0)
Monocytes Absolute: 0.5 10*3/uL (ref 0.1–1.0)
Monocytes Relative: 9 %
Neutro Abs: 2.7 10*3/uL (ref 1.7–7.7)
Neutrophils Relative %: 52 %
Platelets: 459 10*3/uL — ABNORMAL HIGH (ref 150–400)
RBC: 3.84 MIL/uL — ABNORMAL LOW (ref 3.87–5.11)
RDW: 13.6 % (ref 11.5–15.5)
WBC: 5.2 10*3/uL (ref 4.0–10.5)
nRBC: 0 % (ref 0.0–0.2)

## 2021-08-03 MED ORDER — FAMOTIDINE 20 MG IN NS 100 ML IVPB
20.0000 mg | Freq: Once | INTRAVENOUS | Status: AC
  Administered 2021-08-03: 20 mg via INTRAVENOUS
  Filled 2021-08-03: qty 20

## 2021-08-03 MED ORDER — SODIUM CHLORIDE 0.9 % IV SOLN
Freq: Once | INTRAVENOUS | Status: AC
  Filled 2021-08-03: qty 250

## 2021-08-03 MED ORDER — SODIUM CHLORIDE 0.9 % IV SOLN
10.0000 mg | Freq: Once | INTRAVENOUS | Status: AC
  Administered 2021-08-03: 10 mg via INTRAVENOUS
  Filled 2021-08-03: qty 10

## 2021-08-03 MED ORDER — SODIUM CHLORIDE 0.9 % IV SOLN
80.0000 mg/m2 | Freq: Once | INTRAVENOUS | Status: AC
  Administered 2021-08-03: 120 mg via INTRAVENOUS
  Filled 2021-08-03: qty 20

## 2021-08-03 MED ORDER — ACETAMINOPHEN 325 MG PO TABS
650.0000 mg | ORAL_TABLET | Freq: Once | ORAL | Status: AC
  Administered 2021-08-03: 650 mg via ORAL
  Filled 2021-08-03: qty 2

## 2021-08-03 MED ORDER — DIPHENHYDRAMINE HCL 50 MG/ML IJ SOLN
50.0000 mg | Freq: Once | INTRAMUSCULAR | Status: AC
  Administered 2021-08-03: 50 mg via INTRAVENOUS
  Filled 2021-08-03: qty 1

## 2021-08-03 MED ORDER — TRASTUZUMAB-DKST CHEMO 150 MG IV SOLR
2.0000 mg/kg | Freq: Once | INTRAVENOUS | Status: AC
  Administered 2021-08-03: 105 mg via INTRAVENOUS
  Filled 2021-08-03: qty 5

## 2021-08-03 NOTE — Patient Instructions (Signed)
[facility]  Discharge Instructions: Thank you for choosing [facility] to provide your oncology and hematology care.  If you have a lab appointment with the [facility], please go directly to the [facility] and check in at the registration area.  Wear comfortable clothing and clothing appropriate for easy access to any Portacath or PICC line.   We strive to give you quality time with your provider. You may need to reschedule your appointment if you arrive late (15 or more minutes).  Arriving late affects you and other patients whose appointments are after yours.  Also, if you miss three or more appointments without notifying the office, you may be dismissed from the clinic at the provider's discretion.      For prescription refill requests, have your pharmacy contact our office and allow 72 hours for refills to be completed.    Today you received the following chemotherapy and/or immunotherapy agents Taxol, Herceptin      To help prevent nausea and vomiting after your treatment, we encourage you to take your nausea medication as directed.  BELOW ARE SYMPTOMS THAT SHOULD BE REPORTED IMMEDIATELY: *FEVER GREATER THAN 100.4 F (38 C) OR HIGHER *CHILLS OR SWEATING *NAUSEA AND VOMITING THAT IS NOT CONTROLLED WITH YOUR NAUSEA MEDICATION *UNUSUAL SHORTNESS OF BREATH *UNUSUAL BRUISING OR BLEEDING *URINARY PROBLEMS (pain or burning when urinating, or frequent urination) *BOWEL PROBLEMS (unusual diarrhea, constipation, pain near the anus) TENDERNESS IN MOUTH AND THROAT WITH OR WITHOUT PRESENCE OF ULCERS (sore throat, sores in mouth, or a toothache) UNUSUAL RASH, SWELLING OR PAIN  UNUSUAL VAGINAL DISCHARGE OR ITCHING   Items with * indicate a potential emergency and should be followed up as soon as possible or go to the Emergency Department if any problems should occur.  Please show the CHEMOTHERAPY ALERT CARD or IMMUNOTHERAPY ALERT CARD at  check-in to the Emergency Department and triage nurse.  Should you have questions after your visit or need to cancel or reschedule your appointment, please contact [facility]  [phone] and follow the prompts.  Office hours are 8:00 a.m. to 4:30 p.m. Monday - Friday. Please note that voicemails left after 4:00 p.m. may not be returned until the following business day.  We are closed weekends and major holidays. You have access to a nurse at all times for urgent questions. Please call the main number to the clinic [phone] and follow the prompts.  For any non-urgent questions, you may also contact your provider using MyChart. We now offer e-Visits for anyone 22 and older to request care online for non-urgent symptoms. For details visit [email or web address].   Also download the MyChart app! Go to the app store, search "MyChart", open the app, select [facility], and log in with your MyChart username and password.  Due to Covid, a mask is required upon entering the hospital/clinic. If you do not have a mask, one will be given to you upon arrival. For doctor visits, patients may have 1 support person aged 34 or older with them. For treatment visits, patients cannot have anyone with them due to current Covid guidelines and our immunocompromised population. Trastuzumab injection for infusion What is this medication? TRASTUZUMAB (tras TOO zoo mab) is a monoclonal antibody. It is used to treat breast cancer and stomach cancer. This medicine may be used for other purposes; ask your health care provider or pharmacist if you have questions. COMMON BRAND NAME(S): Herceptin, Donnald Garre What  should I tell my care team before I take this medication? They need to know if you have any of these conditions: heart disease heart failure lung or breathing disease, like asthma an unusual or allergic reaction to trastuzumab, benzyl  alcohol, or other medications, foods, dyes, or preservatives pregnant or trying to get pregnant breast-feeding How should I use this medication? This drug is given as an infusion into a vein. It is administered in a hospital or clinic by a specially trained health care professional. Talk to your pediatrician regarding the use of this medicine in children. This medicine is not approved for use in children. Overdosage: If you think you have taken too much of this medicine contact a poison control center or emergency room at once. NOTE: This medicine is only for you. Do not share this medicine with others. What if I miss a dose? It is important not to miss a dose. Call your doctor or health care professional if you are unable to keep an appointment. What may interact with this medication? This medicine may interact with the following medications: certain types of chemotherapy, such as daunorubicin, doxorubicin, epirubicin, and idarubicin This list may not describe all possible interactions. Give your health care provider a list of all the medicines, herbs, non-prescription drugs, or dietary supplements you use. Also tell them if you smoke, drink alcohol, or use illegal drugs. Some items may interact with your medicine. What should I watch for while using this medication? Visit your doctor for checks on your progress. Report any side effects. Continue your course of treatment even though you feel ill unless your doctor tells you to stop. Call your doctor or health care professional for advice if you get a fever, chills or sore throat, or other symptoms of a cold or flu. Do not treat yourself. Try to avoid being around people who are sick. You may experience fever, chills and shaking during your first infusion. These effects are usually mild and can be treated with other medicines. Report any side effects during the infusion to your health care professional. Fever and chills usually do not happen with  later infusions. Do not become pregnant while taking this medicine or for 7 months after stopping it. Women should inform their doctor if they wish to become pregnant or think they might be pregnant. Women of child-bearing potential will need to have a negative pregnancy test before starting this medicine. There is a potential for serious side effects to an unborn child. Talk to your health care professional or pharmacist for more information. Do not breast-feed an infant while taking this medicine or for 7 months after stopping it. Women must use effective birth control with this medicine. What side effects may I notice from receiving this medication? Side effects that you should report to your doctor or health care professional as soon as possible: allergic reactions like skin rash, itching or hives, swelling of the face, lips, or tongue chest pain or palpitations cough dizziness feeling faint or lightheaded, falls fever general ill feeling or flu-like symptoms signs of worsening heart failure like breathing problems; swelling in your legs and feet unusually weak or tired Side effects that usually do not require medical attention (report to your doctor or health care professional if they continue or are bothersome): bone pain changes in taste diarrhea joint pain nausea/vomiting weight loss This list may not describe all possible side effects. Call your doctor for medical advice about side effects. You may report side effects to FDA  at 1-800-FDA-1088. Where should I keep my medication? This drug is given in a hospital or clinic and will not be stored at home. NOTE: This sheet is a summary. It may not cover all possible information. If you have questions about this medicine, talk to your doctor, pharmacist, or health care provider.  2022 Elsevier/Gold Standard (2016-09-14 00:00:00) Paclitaxel injection What is this medication? PACLITAXEL (PAK li TAX el) is a chemotherapy drug. It targets  fast dividing cells, like cancer cells, and causes these cells to die. This medicine is used to treat ovarian cancer, breast cancer, lung cancer, Kaposi's sarcoma, and other cancers. This medicine may be used for other purposes; ask your health care provider or pharmacist if you have questions. COMMON BRAND NAME(S): Onxol, Taxol What should I tell my care team before I take this medication? They need to know if you have any of these conditions: history of irregular heartbeat liver disease low blood counts, like low white cell, platelet, or red cell counts lung or breathing disease, like asthma tingling of the fingers or toes, or other nerve disorder an unusual or allergic reaction to paclitaxel, alcohol, polyoxyethylated castor oil, other chemotherapy, other medicines, foods, dyes, or preservatives pregnant or trying to get pregnant breast-feeding How should I use this medication? This drug is given as an infusion into a vein. It is administered in a hospital or clinic by a specially trained health care professional. Talk to your pediatrician regarding the use of this medicine in children. Special care may be needed. Overdosage: If you think you have taken too much of this medicine contact a poison control center or emergency room at once. NOTE: This medicine is only for you. Do not share this medicine with others. What if I miss a dose? It is important not to miss your dose. Call your doctor or health care professional if you are unable to keep an appointment. What may interact with this medication? Do not take this medicine with any of the following medications: live virus vaccines This medicine may also interact with the following medications: antiviral medicines for hepatitis, HIV or AIDS certain antibiotics like erythromycin and clarithromycin certain medicines for fungal infections like ketoconazole and itraconazole certain medicines for seizures like carbamazepine, phenobarbital,  phenytoin gemfibrozil nefazodone rifampin St. John's wort This list may not describe all possible interactions. Give your health care provider a list of all the medicines, herbs, non-prescription drugs, or dietary supplements you use. Also tell them if you smoke, drink alcohol, or use illegal drugs. Some items may interact with your medicine. What should I watch for while using this medication? Your condition will be monitored carefully while you are receiving this medicine. You will need important blood work done while you are taking this medicine. This medicine can cause serious allergic reactions. To reduce your risk you will need to take other medicine(s) before treatment with this medicine. If you experience allergic reactions like skin rash, itching or hives, swelling of the face, lips, or tongue, tell your doctor or health care professional right away. In some cases, you may be given additional medicines to help with side effects. Follow all directions for their use. This drug may make you feel generally unwell. This is not uncommon, as chemotherapy can affect healthy cells as well as cancer cells. Report any side effects. Continue your course of treatment even though you feel ill unless your doctor tells you to stop. Call your doctor or health care professional for advice if you get a fever,  chills or sore throat, or other symptoms of a cold or flu. Do not treat yourself. This drug decreases your body's ability to fight infections. Try to avoid being around people who are sick. This medicine may increase your risk to bruise or bleed. Call your doctor or health care professional if you notice any unusual bleeding. Be careful brushing and flossing your teeth or using a toothpick because you may get an infection or bleed more easily. If you have any dental work done, tell your dentist you are receiving this medicine. Avoid taking products that contain aspirin, acetaminophen, ibuprofen, naproxen,  or ketoprofen unless instructed by your doctor. These medicines may hide a fever. Do not become pregnant while taking this medicine. Women should inform their doctor if they wish to become pregnant or think they might be pregnant. There is a potential for serious side effects to an unborn child. Talk to your health care professional or pharmacist for more information. Do not breast-feed an infant while taking this medicine. Men are advised not to father a child while receiving this medicine. This product may contain alcohol. Ask your pharmacist or healthcare provider if this medicine contains alcohol. Be sure to tell all healthcare providers you are taking this medicine. Certain medicines, like metronidazole and disulfiram, can cause an unpleasant reaction when taken with alcohol. The reaction includes flushing, headache, nausea, vomiting, sweating, and increased thirst. The reaction can last from 30 minutes to several hours. What side effects may I notice from receiving this medication? Side effects that you should report to your doctor or health care professional as soon as possible: allergic reactions like skin rash, itching or hives, swelling of the face, lips, or tongue breathing problems changes in vision fast, irregular heartbeat high or low blood pressure mouth sores pain, tingling, numbness in the hands or feet signs of decreased platelets or bleeding - bruising, pinpoint red spots on the skin, black, tarry stools, blood in the urine signs of decreased red blood cells - unusually weak or tired, feeling faint or lightheaded, falls signs of infection - fever or chills, cough, sore throat, pain or difficulty passing urine signs and symptoms of liver injury like dark yellow or brown urine; general ill feeling or flu-like symptoms; light-colored stools; loss of appetite; nausea; right upper belly pain; unusually weak or tired; yellowing of the eyes or skin swelling of the ankles, feet,  hands unusually slow heartbeat Side effects that usually do not require medical attention (report to your doctor or health care professional if they continue or are bothersome): diarrhea hair loss loss of appetite muscle or joint pain nausea, vomiting pain, redness, or irritation at site where injected tiredness This list may not describe all possible side effects. Call your doctor for medical advice about side effects. You may report side effects to FDA at 1-800-FDA-1088. Where should I keep my medication? This drug is given in a hospital or clinic and will not be stored at home. NOTE: This sheet is a summary. It may not cover all possible information. If you have questions about this medicine, talk to your doctor, pharmacist, or health care provider.  2022 Elsevier/Gold Standard (2021-05-19 00:00:00)

## 2021-08-03 NOTE — Progress Notes (Signed)
Per Beckey Rutter, NP okay to proceed with today's treatment with CMP results from 07/27/2021 and with HR of 101

## 2021-08-03 NOTE — Progress Notes (Signed)
Hematology/Oncology Progress Note [phone] [phone]   Patient Care Team: Kirk Ruths, MD as PCP - General (Internal Medicine)  REFERRING PROVIDER: Kirk Ruths, MD   CHIEF COMPLAINTS/REASON FOR VISIT:  Follow up for treatment of breast cancer  HISTORY OF PRESENTING ILLNESS: Anna Banks is a  45 y.o.  female with PMH listed below was seen in consultation at the request of  Kirk Ruths, MD  for evaluation of breast cancer.   Patient was accompanied by her half sister today. Per sister, patient has history of seizure since childhood and has intellectual/cognitive impairment. She is illiterate. She follows instructions from her family members.  She lives with her mother and her half sister who takes of her and helps her to make medical decision.   They waived interpretor. Both patient and her half sister speak [place].  Menarche 65 years of age. G0P0. No child, no previous pregnancy. Per her sister, patient is not sexually active No prior use of birth control pills or hormone replacement therapy. Denies previous history of biopsy.  02/16/2021, screening mammogram showed calcification in the right breast needs further evaluation.  Left breast possible mass with distortion requires further evaluation. 02/26/2021, left breast showed a 10 mm indeterminate mass, 2:00, 8 cm from nipple.  No mammographic evidence of malignancy in the right breast.  No left axillary lymphadenopathy. 03/10/2021, patient underwent ultrasound-guided biopsy of left breast mass.  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  ER low positive [1-10%], PR positive [1-10%], HER2 positive by IHC 3+  # 04/09/2021, status postlumpectomy and sentinel lymph node biopsy. Invasive mammary carcinoma, no special type, 4 lymph nodes all negative for metastatic carcinoma.  DCIS present.  LVI not  identified.Grade 2, all margins negative for invasive carcinoma. pT1c pN0, ER[staining was repeated on surgical specimen] [11-50%]+ PR [1-10%]+, HER 2+  Baseline echocardiogram showed normal LVEF.  INTERVAL HISTORY: Anna Banks is a 45 y.o. female with above history of HER2 positive breast cancer who returns to clinic for labs, further evaluation, and consideration of taxol and trastuzumab. She had one episode of abdominal pain and indigestion on saturation. Took tums and symptoms resolved. Today she feels well and denies complaints.  Due to language barrier, a vietnamese interpreter was utilized for all patient interactions.   Review of Systems  Constitutional:  Negative for appetite change, chills, fatigue and fever.  HENT:   Negative for hearing loss and voice change.   Eyes:  Negative for eye problems.  Respiratory:  Negative for chest tightness and cough.   Cardiovascular:  Negative for chest pain.  Gastrointestinal:  Negative for abdominal distention, abdominal pain and blood in stool.  Endocrine: Negative for hot flashes.  Genitourinary:  Negative for difficulty urinating and frequency.   Musculoskeletal:  Negative for arthralgias.  Skin:  Negative for itching and rash.       + hair loss  Neurological:  Negative for extremity weakness.  Hematological:  Negative for adenopathy. Does not bruise/bleed easily.  Psychiatric/Behavioral:  Negative for confusion and depression. The patient is not nervous/anxious.    MEDICAL HISTORY:  Past Medical History:  Diagnosis Date   Down syndrome    Seizure [facility])     SURGICAL HISTORY: Past Surgical History:  Procedure Laterality Date   BREAST BIOPSY Left 03/10/2021   [place] BX,ribbon clip, IMC   BREAST LUMPECTOMY,RADIO FREQ LOCALIZER,AXILLARY SENTINEL LYMPH NODE BIOPSY Left 04/09/2021   Procedure:  BREAST LUMPECTOMY,RADIO FREQ LOCALIZER,AXILLARY SENTINEL LYMPH NODE BIOPSY;  Surgeon: Benjamine Sprague, DO;  Location: [facility] ORS;  Service: General;   Laterality: Left;    SOCIAL HISTORY: Social History   Socioeconomic History   Marital status: Single    Spouse name: Not on file   Number of children: Not on file   Years of education: Not on file   Highest education level: Not on file  Occupational History   Not on file  Tobacco Use   Smoking status: Never   Smokeless tobacco: Never  Vaping Use   Vaping Use: Never used  Substance and Sexual Activity   Alcohol use: Never   Drug use: Not Currently   Sexual activity: Not on file  Other Topics Concern   Not on file  Social History Narrative   Not on file   Social Determinants of Health   Financial Resource Strain: Not on file  Food Insecurity: Not on file  Transportation Needs: Not on file  Physical Activity: Not on file  Stress: Not on file  Social Connections: Not on file  Intimate Partner Violence: Not on file    FAMILY HISTORY: History reviewed. No pertinent family history.  ALLERGIES:  has No Known Allergies.  MEDICATIONS:  Current Outpatient Medications  Medication Sig Dispense Refill   acetaminophen (TYLENOL) 325 MG tablet Take 325-650 mg by mouth every 6 (six) hours as needed for moderate pain.     ethosuximide (ZARONTIN) 250 MG capsule Take 250 mg by mouth 2 (two) times daily.     potassium chloride SA (KLOR-CON) 20 MEQ tablet Take 1 tablet (20 mEq total) by mouth daily. 30 tablet 0   VITAMIN D PO Take 1 capsule by mouth daily.     HYDROcodone-acetaminophen (NORCO) 5-325 MG tablet Take 1 tablet by mouth every 6 (six) hours as needed for up to 6 doses for moderate pain. (Patient not taking: Reported on 05/15/2021) 6 tablet 0   ondansetron (ZOFRAN) 8 MG tablet Take 1 tablet (8 mg total) by mouth 2 (two) times daily as needed (Nausea or vomiting). (Patient not taking: Reported on 05/15/2021) 30 tablet 1   No current facility-administered medications for this visit.     PHYSICAL EXAMINATION: ECOG PERFORMANCE STATUS: 0 - Asymptomatic Vitals:   08/03/21 0858   BP: 130/80  Pulse: (!) 101  Temp: 98.2 F (36.8 C)   Filed Weights   08/03/21 0858  Weight: 115 lb (52.2 kg)    Physical Exam Constitutional:      General: She is not in acute distress. HENT:     Head: Normocephalic and atraumatic.  Eyes:     General: No scleral icterus. Cardiovascular:     Rate and Rhythm: Regular rhythm.     Heart sounds: Normal heart sounds.  Pulmonary:     Effort: Pulmonary effort is normal. No respiratory distress.     Breath sounds: No wheezing.  Abdominal:     General: Bowel sounds are normal. There is no distension.     Palpations: Abdomen is soft.  Musculoskeletal:        General: No deformity. Normal range of motion.     Cervical back: Normal range of motion and neck supple.  Skin:    General: Skin is warm and dry.     Findings: No erythema or rash.  Neurological:     Mental Status: She is alert. Mental status is at baseline.     Cranial Nerves: No cranial nerve deficit.     Coordination:  Coordination normal.  Psychiatric:        Mood and Affect: Mood normal.    LABORATORY DATA:  I have reviewed the data as listed Lab Results  Component Value Date   WBC 5.2 08/03/2021   HGB 12.3 08/03/2021   HCT 37.6 08/03/2021   MCV 97.9 08/03/2021   PLT 459 (H) 08/03/2021   Recent Labs    07/13/21 0843 07/20/21 0823 07/27/21 0845  NA 132* 137 136  K 4.1 3.8 3.2*  CL 99 101 101  CO2 23 26 27   GLUCOSE 135* 175* 102*  BUN 13 15 12   CREATININE 0.64 0.78 0.62  CALCIUM 9.4 9.6 9.4  GFRNONAA >60 >60 >60  PROT 7.7 7.9 7.6  ALBUMIN 4.2 4.4 4.2  AST 29 34 28  ALT 22 26 24   ALKPHOS 54 57 55  BILITOT 0.6 0.6 0.3    Iron/TIBC/Ferritin/ %Sat No results found for: IRON, TIBC, FERRITIN, IRONPCTSAT    RADIOGRAPHIC STUDIES: I have personally reviewed the radiological images as listed and agreed with the findings in the report. ECHOCARDIOGRAM COMPLETE  Result Date: 07/28/2021    ECHOCARDIOGRAM REPORT   Patient Name:   Anna Banks Date of  Exam: 07/28/2021 Medical Rec #:  [id number]    Height:       61.0 in Accession #:    [id number]   Weight:       117.8 lb Date of Birth:  [birth year]    BSA:          1.508 m Patient Age:    43 years     BP:           120/68 mmHg Patient Gender: F            HR:           85 bpm. Exam Location:  [facility] Procedure: 2D Echo, Color Doppler and Cardiac Doppler Indications:     Encounter for antineoplastic chemotherapy  History:         Patient has prior history of Echocardiogram examinations, most                  recent 05/06/2021. Invasive carcinoma of breast                  Z51.81 Tachycardia                  Encounter for monitoring cardiotoxic drug therapy.  Sonographer:     Charmayne Sheer Referring Phys:  [id number] ZHOU YU Diagnosing Phys: Nelva Bush MD  Sonographer Comments: Suboptimal apical window. Global longitudinal strain was attempted. IMPRESSIONS  1. Left ventricular ejection fraction, by estimation, is 60 to 65%. The left ventricle has normal function. Left ventricular endocardial border not optimally defined to evaluate regional wall motion. Left ventricular diastolic parameters were normal.  2. Right ventricular systolic function is normal. The right ventricular size is normal. Tricuspid regurgitation signal is inadequate for assessing PA pressure.  3. The mitral valve is normal in structure. Trivial mitral valve regurgitation. No evidence of mitral stenosis.  4. The aortic valve is tricuspid. Aortic valve regurgitation is not visualized. No aortic stenosis is present.  5. The inferior vena cava is normal in size with greater than 50% respiratory variability, suggesting right atrial pressure of 3 mmHg. FINDINGS  Left Ventricle: Left ventricular ejection fraction, by estimation, is 60 to 65%. The left ventricle has normal function. Left ventricular endocardial border not optimally defined to evaluate regional wall motion. The  global longitudinal strain is normal  despite suboptimal segment tracking. The left  ventricular internal cavity size was normal in size. There is borderline left ventricular hypertrophy. Left ventricular diastolic parameters were normal. Right Ventricle: The right ventricular size is normal. No increase in right ventricular wall thickness. Right ventricular systolic function is normal. Tricuspid regurgitation signal is inadequate for assessing PA pressure. Left Atrium: Left atrial size was normal in size. Right Atrium: Right atrial size was normal in size. Pericardium: There is no evidence of pericardial effusion. Mitral Valve: The mitral valve is normal in structure. Trivial mitral valve regurgitation. No evidence of mitral valve stenosis. MV peak gradient, 4.7 mmHg. The mean mitral valve gradient is 3.0 mmHg. Tricuspid Valve: The tricuspid valve is not well visualized. Tricuspid valve regurgitation is trivial. Aortic Valve: The aortic valve is tricuspid. Aortic valve regurgitation is not visualized. No aortic stenosis is present. Aortic valve mean gradient measures 4.0 mmHg. Aortic valve peak gradient measures 8.1 mmHg. Aortic valve area, by VTI measures 2.15 cm. Pulmonic Valve: The pulmonic valve was not well visualized. Pulmonic valve regurgitation is mild. No evidence of pulmonic stenosis. Aorta: The aortic root is normal in size and structure. Pulmonary Artery: The pulmonary artery is not well seen. Venous: The inferior vena cava is normal in size with greater than 50% respiratory variability, suggesting right atrial pressure of 3 mmHg. IAS/Shunts: No atrial level shunt detected by color flow Doppler.  LEFT VENTRICLE PLAX 2D LVIDd:         4.30 cm   Diastology LVIDs:         2.60 cm   LV e' medial:    9.25 cm/s LV PW:         0.99 cm   LV E/e' medial:  9.6 LV IVS:        0.80 cm   LV e' lateral:   10.80 cm/s LVOT diam:     1.90 cm   LV E/e' lateral: 8.3 LV SV:         56 LV SV Index:   37        2D Longitudinal Strain LVOT Area:     2.84 cm  2D Strain GLS Avg:     -16.3 %  RIGHT VENTRICLE  RV Basal diam:  3.10 cm LEFT ATRIUM             Index        RIGHT ATRIUM          Index LA diam:        2.60 cm 1.72 cm/m   RA Area:     8.70 cm LA Vol (A2C):   26.2 ml 17.37 ml/m  RA Volume:   14.50 ml 9.61 ml/m LA Vol (A4C):   21.5 ml 14.25 ml/m LA Biplane Vol: 24.5 ml 16.24 ml/m  AORTIC VALVE                    PULMONIC VALVE AV Area (Vmax):    2.26 cm     PV Vmax:       1.45 m/s AV Area (Vmean):   2.32 cm     PV Vmean:      90.400 cm/s AV Area (VTI):     2.15 cm     PV VTI:        0.202 m AV Vmax:           142.00 cm/s  PV Peak grad:  8.4 mmHg AV Vmean:  91.400 cm/s  PV Mean grad:  4.0 mmHg AV VTI:            0.260 m AV Peak Grad:      8.1 mmHg AV Mean Grad:      4.0 mmHg LVOT Vmax:         113.00 cm/s LVOT Vmean:        74.700 cm/s LVOT VTI:          0.197 m LVOT/AV VTI ratio: 0.76  AORTA Ao Root diam: 2.60 cm MITRAL VALVE MV Area (PHT): 4.83 cm    SHUNTS MV Area VTI:   2.19 cm    Systemic VTI:  0.20 m MV Peak grad:  4.7 mmHg    Systemic Diam: 1.90 cm MV Mean grad:  3.0 mmHg MV Vmax:       1.08 m/s MV Vmean:      77.5 cm/s MV Decel Time: 157 msec MV E velocity: 89.10 cm/s MV A velocity: 85.70 cm/s MV E/A ratio:  1.04 Harrell Gave End MD Electronically signed by Nelva Bush MD Signature Date/Time: 07/28/2021/12:52:46 PM    Final      ASSESSMENT & PLAN:  No diagnosis found.  Cancer Staging  Invasive carcinoma of breast ([place]) Staging form: Breast, AJCC 8th Edition - Pathologic stage from 04/22/2021: Stage IA (pT1c, pN0, cM0, G2, ER+, PR+, HER2+) - Signed by Earlie Server, MD on 04/22/2021    Cancer Staging  Invasive carcinoma of breast [facility]) Staging form: Breast, AJCC 8th Edition - Pathologic stage from 04/22/2021: Stage IA (pT1c, pN0, cM0, G2, ER+, PR+, HER2+) - Signed by Earlie Server, MD on 04/22/2021 Left invasive carcinoma- pT1c pN0 cM0, ER 11-50%, PR 1-10%, HER2 positive. Labs reviewed. Proceed with taxol and trastuzumab.  Thrombocytosis- elevated at 459. Likely reactive.   Hypokalemia- on KCL 13mq daily Tachycardia- s/p LVEF 60-65%. Normal. Proceed with trastuzumab.   Follow up with Dr. YTasia Catchingsas scheduled or return to clinic sooner if needed.   We spent sufficient time to discuss many aspect of care, questions were answered to patient's satisfaction.   All questions were answered. The patient knows to call the clinic with any problems questions or concerns.  LBeckey Rutter DNP, AGNP-C [place] AG[phone](clinic) 08/03/2021

## 2021-08-04 ENCOUNTER — Other Ambulatory Visit: Payer: Self-pay | Admitting: Oncology

## 2021-08-10 ENCOUNTER — Ambulatory Visit: Payer: Medicare Other

## 2021-08-10 ENCOUNTER — Ambulatory Visit: Payer: Medicare Other | Admitting: Oncology

## 2021-08-10 ENCOUNTER — Other Ambulatory Visit: Payer: Medicare Other

## 2021-08-12 ENCOUNTER — Encounter: Payer: Self-pay | Admitting: Oncology

## 2021-08-12 ENCOUNTER — Inpatient Hospital Stay: Payer: Medicare Other

## 2021-08-12 ENCOUNTER — Other Ambulatory Visit: Payer: Self-pay

## 2021-08-12 ENCOUNTER — Inpatient Hospital Stay (HOSPITAL_BASED_OUTPATIENT_CLINIC_OR_DEPARTMENT_OTHER): Payer: Medicare Other | Admitting: Oncology

## 2021-08-12 ENCOUNTER — Telehealth: Payer: Self-pay

## 2021-08-12 VITALS — BP 110/66 | HR 98

## 2021-08-12 VITALS — BP 118/79 | HR 96 | Temp 98.5°F | Resp 18 | Wt 114.5 lb

## 2021-08-12 DIAGNOSIS — C50919 Malignant neoplasm of unspecified site of unspecified female breast: Secondary | ICD-10-CM

## 2021-08-12 DIAGNOSIS — E876 Hypokalemia: Secondary | ICD-10-CM

## 2021-08-12 DIAGNOSIS — Z5111 Encounter for antineoplastic chemotherapy: Secondary | ICD-10-CM

## 2021-08-12 DIAGNOSIS — Z5112 Encounter for antineoplastic immunotherapy: Secondary | ICD-10-CM | POA: Diagnosis not present

## 2021-08-12 LAB — COMPREHENSIVE METABOLIC PANEL
ALT: 18 U/L (ref 0–44)
AST: 26 U/L (ref 15–41)
Albumin: 4.1 g/dL (ref 3.5–5.0)
Alkaline Phosphatase: 55 U/L (ref 38–126)
Anion gap: 8 (ref 5–15)
BUN: 14 mg/dL (ref 6–20)
CO2: 24 mmol/L (ref 22–32)
Calcium: 8.9 mg/dL (ref 8.9–10.3)
Chloride: 104 mmol/L (ref 98–111)
Creatinine, Ser: 0.73 mg/dL (ref 0.44–1.00)
GFR, Estimated: >60 mL/min (ref >60)
Glucose, Bld: 135 mg/dL — ABNORMAL HIGH (ref 70–99)
Potassium: 3.4 mmol/L — ABNORMAL LOW (ref 3.5–5.1)
Sodium: 136 mmol/L (ref 135–145)
Total Bilirubin: 0.6 mg/dL (ref 0.3–1.2)
Total Protein: 7.3 g/dL (ref 6.5–8.1)

## 2021-08-12 LAB — CBC WITH DIFFERENTIAL/PLATELET
Abs Immature Granulocytes: 0.03 10*3/uL (ref 0.00–0.07)
Basophils Absolute: 0 10*3/uL (ref 0.0–0.1)
Basophils Relative: 1 %
Eosinophils Absolute: 0.1 10*3/uL (ref 0.0–0.5)
Eosinophils Relative: 3 %
HCT: 37 % (ref 36.0–46.0)
Hemoglobin: 12 g/dL (ref 12.0–15.0)
Immature Granulocytes: 1 %
Lymphocytes Relative: 34 %
Lymphs Abs: 1.7 10*3/uL (ref 0.7–4.0)
MCH: 32.1 pg (ref 26.0–34.0)
MCHC: 32.4 g/dL (ref 30.0–36.0)
MCV: 98.9 fL (ref 80.0–100.0)
Monocytes Absolute: 0.5 10*3/uL (ref 0.1–1.0)
Monocytes Relative: 10 %
Neutro Abs: 2.6 10*3/uL (ref 1.7–7.7)
Neutrophils Relative %: 51 %
Platelets: 424 10*3/uL — ABNORMAL HIGH (ref 150–400)
RBC: 3.74 MIL/uL — ABNORMAL LOW (ref 3.87–5.11)
RDW: 13.4 % (ref 11.5–15.5)
WBC: 5 10*3/uL (ref 4.0–10.5)
nRBC: 0 % (ref 0.0–0.2)

## 2021-08-12 MED ORDER — SODIUM CHLORIDE 0.9 % IV SOLN
Freq: Once | INTRAVENOUS | Status: AC
Start: 2021-08-12 — End: 2021-08-12
  Filled 2021-08-12: qty 250

## 2021-08-12 MED ORDER — ACETAMINOPHEN 325 MG PO TABS
650.0000 mg | ORAL_TABLET | Freq: Once | ORAL | Status: AC
  Administered 2021-08-12: 650 mg via ORAL
  Filled 2021-08-12: qty 2

## 2021-08-12 MED ORDER — DIPHENHYDRAMINE HCL 25 MG PO CAPS
50.0000 mg | ORAL_CAPSULE | Freq: Once | ORAL | Status: AC
  Administered 2021-08-12: 50 mg via ORAL
  Filled 2021-08-12: qty 2

## 2021-08-12 MED ORDER — TRASTUZUMAB-DKST CHEMO 150 MG IV SOLR
300.0000 mg | Freq: Once | INTRAVENOUS | Status: AC
  Administered 2021-08-12: 300 mg via INTRAVENOUS
  Filled 2021-08-12: qty 14.29

## 2021-08-12 NOTE — Telephone Encounter (Signed)
Spoke with pharmacy to cancel rx for Klor-Con 35meq. Pharmacist verbalized cancelling.

## 2021-08-12 NOTE — Progress Notes (Signed)
Pt here for follow up. No new concerns voiced.   

## 2021-08-12 NOTE — Patient Instructions (Signed)
CANCER CENTER [place] [facility]  Discharge Instructions: Thank you for choosing [facility] to provide your oncology and hematology care.  If you have a lab appointment with the Cancer Center, please go directly to the Cancer Center and check in at the registration area.  Wear comfortable clothing and clothing appropriate for easy access to any Portacath or PICC line.   We strive to give you quality time with your provider. You may need to reschedule your appointment if you arrive late (15 or more minutes).  Arriving late affects you and other patients whose appointments are after yours.  Also, if you miss three or more appointments without notifying the office, you may be dismissed from the clinic at the provider's discretion.      For prescription refill requests, have your pharmacy contact our office and allow 72 hours for refills to be completed.      To help prevent nausea and vomiting after your treatment, we encourage you to take your nausea medication as directed.  BELOW ARE SYMPTOMS THAT SHOULD BE REPORTED IMMEDIATELY: *FEVER GREATER THAN 100.4 F (38 C) OR HIGHER *CHILLS OR SWEATING *NAUSEA AND VOMITING THAT IS NOT CONTROLLED WITH YOUR NAUSEA MEDICATION *UNUSUAL SHORTNESS OF BREATH *UNUSUAL BRUISING OR BLEEDING *URINARY PROBLEMS (pain or burning when urinating, or frequent urination) *BOWEL PROBLEMS (unusual diarrhea, constipation, pain near the anus) TENDERNESS IN MOUTH AND THROAT WITH OR WITHOUT PRESENCE OF ULCERS (sore throat, sores in mouth, or a toothache) UNUSUAL RASH, SWELLING OR PAIN  UNUSUAL VAGINAL DISCHARGE OR ITCHING   Items with * indicate a potential emergency and should be followed up as soon as possible or go to the Emergency Department if any problems should occur.  Please show the CHEMOTHERAPY ALERT CARD or IMMUNOTHERAPY ALERT CARD at check-in to the Emergency Department and triage nurse.  Should you have questions after your  visit or need to cancel or reschedule your appointment, please contact CANCER CENTER [place] [facility]  [phone] and follow the prompts.  Office hours are 8:00 a.m. to 4:30 p.m. Monday - Friday. Please note that voicemails left after 4:00 p.m. may not be returned until the following business day.  We are closed weekends and major holidays. You have access to a nurse at all times for urgent questions. Please call the main number to the clinic [phone] and follow the prompts.  For any non-urgent questions, you may also contact your provider using MyChart. We now offer e-Visits for anyone 18 and older to request care online for non-urgent symptoms. For details visit [email or web address].   Also download the MyChart app! Go to the app store, search "MyChart", open the app, select [facility], and log in with your MyChart username and password.  Due to Covid, a mask is required upon entering the hospital/clinic. If you do not have a mask, one will be given to you upon arrival. For doctor visits, patients may have 1 support person aged 18 or older with them. For treatment visits, patients cannot have anyone with them due to current Covid guidelines and our immunocompromised population.  

## 2021-08-12 NOTE — Progress Notes (Signed)
Hematology/Oncology follow up  note [phone] [phone]   Patient Care Team: Kirk Ruths, MD as PCP - General (Internal Medicine)  REFERRING PROVIDER: Kirk Ruths, MD  CHIEF COMPLAINTS/REASON FOR VISIT:  Follow up for treatment of breast cancer  HISTORY OF PRESENTING ILLNESS:   Anna Banks is a  45 y.o.  female with PMH listed below was seen in consultation at the request of  Kirk Ruths, MD  for evaluation of breast cancer.   Patient was accompanied by her half sister today. Per sister, patient has history of seizure since childhood and has intellectual/cognitive impairment. She is illiterate. She follows instructions from her family members.  She lives with her mother and her half sister who takes of her and helps her to make medical decision.   They waived interpretor. Both patient and her half sister speak [place].  Menarche 73 years of age. G0P0. No child, no previous pregnancy. Per her sister, patient is not sexually active No prior use of birth control pills or hormone replacement therapy. Denies previous history of biopsy.  02/16/2021, screening mammogram showed calcification in the right breast needs further evaluation.  Left breast possible mass with distortion requires further evaluation. 02/26/2021, left breast showed a 10 mm indeterminate mass, 2:00, 8 cm from nipple.  No mammographic evidence of malignancy in the right breast.  No left axillary lymphadenopathy. 03/10/2021, patient underwent ultrasound-guided biopsy of left breast mass.  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  ER low positive [1-10%], PR positive [1-10%], HER2 positive by IHC 3+  # 04/09/2021, status postlumpectomy and sentinel lymph node biopsy. Invasive mammary carcinoma, no special type, 4 lymph nodes all negative for metastatic carcinoma.  DCIS present.  LVI not  identified.Grade 2, all margins negative for invasive carcinoma. pT1c pN0, ER[staining was repeated on surgical specimen] [11-50%]+ PR [1-10%]+, HER 2+  Baseline echocardiogram showed normal LVEF.  INTERVAL HISTORY Anna Banks is a 45 y.o. female who has above history reviewed by me today presents for follow up visit for chemotherapy for HER2 positive breast cancer  Patient is here by herself today. [place] interpreter online service was used. Rash has improved.  No new concerns. Patient denies any nausea vomiting diarrhea. No shortness of breath, leg swelling. .   Review of Systems  Unable to perform ROS: Other (intellectual/Cognitive impairment)  Constitutional:  Negative for appetite change, chills, fatigue and fever.  HENT:   Negative for hearing loss and voice change.   Eyes:  Negative for eye problems.  Respiratory:  Negative for chest tightness and cough.   Cardiovascular:  Negative for chest pain.  Gastrointestinal:  Negative for abdominal distention, abdominal pain and blood in stool.  Endocrine: Negative for hot flashes.  Genitourinary:  Negative for difficulty urinating and frequency.   Musculoskeletal:  Negative for arthralgias.  Skin:  Negative for itching and rash.       + hair loss  Neurological:  Negative for extremity weakness.  Hematological:  Negative for adenopathy.  Psychiatric/Behavioral:  Negative for confusion.    MEDICAL HISTORY:  Past Medical History:  Diagnosis Date   Down syndrome    Seizure [facility])     SURGICAL HISTORY: Past Surgical History:  Procedure Laterality Date   BREAST BIOPSY Left 03/10/2021   [place] BX,ribbon clip, IMC   BREAST LUMPECTOMY,RADIO FREQ LOCALIZER,AXILLARY SENTINEL LYMPH NODE BIOPSY Left 04/09/2021   Procedure: BREAST LUMPECTOMY,RADIO FREQ LOCALIZER,AXILLARY SENTINEL LYMPH  NODE BIOPSY;  Surgeon: Benjamine Sprague, DO;  Location: [facility] ORS;  Service: General;  Laterality: Left;    SOCIAL HISTORY: Social History    Socioeconomic History   Marital status: Single    Spouse name: Not on file   Number of children: Not on file   Years of education: Not on file   Highest education level: Not on file  Occupational History   Not on file  Tobacco Use   Smoking status: Never   Smokeless tobacco: Never  Vaping Use   Vaping Use: Never used  Substance and Sexual Activity   Alcohol use: Never   Drug use: Not Currently   Sexual activity: Not on file  Other Topics Concern   Not on file  Social History Narrative   Not on file   Social Determinants of Health   Financial Resource Strain: Not on file  Food Insecurity: Not on file  Transportation Needs: Not on file  Physical Activity: Not on file  Stress: Not on file  Social Connections: Not on file  Intimate Partner Violence: Not on file    FAMILY HISTORY: History reviewed. No pertinent family history.  ALLERGIES:  has No Known Allergies.  MEDICATIONS:  Current Outpatient Medications  Medication Sig Dispense Refill   acetaminophen (TYLENOL) 325 MG tablet Take 325-650 mg by mouth every 6 (six) hours as needed for moderate pain.     ethosuximide (ZARONTIN) 250 MG capsule Take 250 mg by mouth 2 (two) times daily.     ondansetron (ZOFRAN) 8 MG tablet Take 1 tablet (8 mg total) by mouth 2 (two) times daily as needed (Nausea or vomiting). 30 tablet 1   potassium chloride SA (KLOR-CON) 20 MEQ tablet Take 1 tablet (20 mEq total) by mouth daily. 30 tablet 0   VITAMIN D PO Take 1 capsule by mouth daily.     HYDROcodone-acetaminophen (NORCO) 5-325 MG tablet Take 1 tablet by mouth every 6 (six) hours as needed for up to 6 doses for moderate pain. (Patient not taking: Reported on 08/12/2021) 6 tablet 0   KLOR-CON M10 10 MEQ tablet TAKE 1 TABLET BY MOUTH EVERY DAY (Patient not taking: Reported on 08/12/2021) 30 tablet 1   No current facility-administered medications for this visit.     PHYSICAL EXAMINATION: ECOG PERFORMANCE STATUS: 0 -  Asymptomatic Vitals:   08/12/21 0842  BP: 118/79  Pulse: 96  Resp: 18  Temp: 98.5 F (36.9 C)   Filed Weights   08/12/21 0842  Weight: 114 lb 8 oz (51.9 kg)    Physical Exam Constitutional:      General: She is not in acute distress. HENT:     Head: Normocephalic and atraumatic.  Eyes:     General: No scleral icterus. Cardiovascular:     Rate and Rhythm: Regular rhythm.     Heart sounds: Normal heart sounds.  Pulmonary:     Effort: Pulmonary effort is normal. No respiratory distress.     Breath sounds: No wheezing.  Abdominal:     General: Bowel sounds are normal. There is no distension.     Palpations: Abdomen is soft.  Musculoskeletal:        General: No deformity. Normal range of motion.     Cervical back: Normal range of motion and neck supple.  Skin:    General: Skin is warm and dry.     Findings: No erythema or rash.  Neurological:     Mental Status: She is alert. Mental status is at baseline.  Cranial Nerves: No cranial nerve deficit.     Coordination: Coordination normal.  Psychiatric:        Mood and Affect: Mood normal.   .   LABORATORY DATA:  I have reviewed the data as listed Lab Results  Component Value Date   WBC 5.0 08/12/2021   HGB 12.0 08/12/2021   HCT 37.0 08/12/2021   MCV 98.9 08/12/2021   PLT 424 (H) 08/12/2021   Recent Labs    07/27/21 0845 08/03/21 0847 08/12/21 0828  NA 136 136 136  K 3.2* 3.7 3.4*  CL 101 103 104  CO2 27 25 24   GLUCOSE 102* 159* 135*  BUN 12 15 14   CREATININE 0.62 0.66 0.73  CALCIUM 9.4 9.6 8.9  GFRNONAA >60 >60 >60  PROT 7.6 7.4 7.3  ALBUMIN 4.2 4.0 4.1  AST 28 25 26   ALT 24 20 18   ALKPHOS 55 56 55  BILITOT 0.3 0.3 0.6    Iron/TIBC/Ferritin/ %Sat No results found for: IRON, TIBC, FERRITIN, IRONPCTSAT    RADIOGRAPHIC STUDIES: I have personally reviewed the radiological images as listed and agreed with the findings in the report. ECHOCARDIOGRAM COMPLETE  Result Date: 07/28/2021     ECHOCARDIOGRAM REPORT   Patient Name:   Araceli Bouche Whinery Date of Exam: 07/28/2021 Medical Rec #:  [id number]    Height:       61.0 in Accession #:    [id number]   Weight:       117.8 lb Date of Birth:  Mar 16, 1976    BSA:          1.508 m Patient Age:    67 years     BP:           120/68 mmHg Patient Gender: F            HR:           85 bpm. Exam Location:  [facility] Procedure: 2D Echo, Color Doppler and Cardiac Doppler Indications:     Encounter for antineoplastic chemotherapy  History:         Patient has prior history of Echocardiogram examinations, most                  recent 05/06/2021. Invasive carcinoma of breast                  Z51.81 Tachycardia                  Encounter for monitoring cardiotoxic drug therapy.  Sonographer:     Charmayne Sheer Referring Phys:  [id number] Quran Vasco Diagnosing Phys: Nelva Bush MD  Sonographer Comments: Suboptimal apical window. Global longitudinal strain was attempted. IMPRESSIONS  1. Left ventricular ejection fraction, by estimation, is 60 to 65%. The left ventricle has normal function. Left ventricular endocardial border not optimally defined to evaluate regional wall motion. Left ventricular diastolic parameters were normal.  2. Right ventricular systolic function is normal. The right ventricular size is normal. Tricuspid regurgitation signal is inadequate for assessing PA pressure.  3. The mitral valve is normal in structure. Trivial mitral valve regurgitation. No evidence of mitral stenosis.  4. The aortic valve is tricuspid. Aortic valve regurgitation is not visualized. No aortic stenosis is present.  5. The inferior vena cava is normal in size with greater than 50% respiratory variability, suggesting right atrial pressure of 3 mmHg. FINDINGS  Left Ventricle: Left ventricular ejection fraction, by estimation, is 60 to 65%. The left ventricle has normal function.  Left ventricular endocardial border not optimally defined to evaluate regional wall motion. The global longitudinal  strain is normal  despite suboptimal segment tracking. The left ventricular internal cavity size was normal in size. There is borderline left ventricular hypertrophy. Left ventricular diastolic parameters were normal. Right Ventricle: The right ventricular size is normal. No increase in right ventricular wall thickness. Right ventricular systolic function is normal. Tricuspid regurgitation signal is inadequate for assessing PA pressure. Left Atrium: Left atrial size was normal in size. Right Atrium: Right atrial size was normal in size. Pericardium: There is no evidence of pericardial effusion. Mitral Valve: The mitral valve is normal in structure. Trivial mitral valve regurgitation. No evidence of mitral valve stenosis. MV peak gradient, 4.7 mmHg. The mean mitral valve gradient is 3.0 mmHg. Tricuspid Valve: The tricuspid valve is not well visualized. Tricuspid valve regurgitation is trivial. Aortic Valve: The aortic valve is tricuspid. Aortic valve regurgitation is not visualized. No aortic stenosis is present. Aortic valve mean gradient measures 4.0 mmHg. Aortic valve peak gradient measures 8.1 mmHg. Aortic valve area, by VTI measures 2.15 cm. Pulmonic Valve: The pulmonic valve was not well visualized. Pulmonic valve regurgitation is mild. No evidence of pulmonic stenosis. Aorta: The aortic root is normal in size and structure. Pulmonary Artery: The pulmonary artery is not well seen. Venous: The inferior vena cava is normal in size with greater than 50% respiratory variability, suggesting right atrial pressure of 3 mmHg. IAS/Shunts: No atrial level shunt detected by color flow Doppler.  LEFT VENTRICLE PLAX 2D LVIDd:         4.30 cm   Diastology LVIDs:         2.60 cm   LV e' medial:    9.25 cm/s LV PW:         0.99 cm   LV E/e' medial:  9.6 LV IVS:        0.80 cm   LV e' lateral:   10.80 cm/s LVOT diam:     1.90 cm   LV E/e' lateral: 8.3 LV SV:         56 LV SV Index:   37        2D Longitudinal Strain LVOT Area:      2.84 cm  2D Strain GLS Avg:     -16.3 %  RIGHT VENTRICLE RV Basal diam:  3.10 cm LEFT ATRIUM             Index        RIGHT ATRIUM          Index LA diam:        2.60 cm 1.72 cm/m   RA Area:     8.70 cm LA Vol (A2C):   26.2 ml 17.37 ml/m  RA Volume:   14.50 ml 9.61 ml/m LA Vol (A4C):   21.5 ml 14.25 ml/m LA Biplane Vol: 24.5 ml 16.24 ml/m  AORTIC VALVE                    PULMONIC VALVE AV Area (Vmax):    2.26 cm     PV Vmax:       1.45 m/s AV Area (Vmean):   2.32 cm     PV Vmean:      90.400 cm/s AV Area (VTI):     2.15 cm     PV VTI:        0.202 m AV Vmax:  142.00 cm/s  PV Peak grad:  8.4 mmHg AV Vmean:          91.400 cm/s  PV Mean grad:  4.0 mmHg AV VTI:            0.260 m AV Peak Grad:      8.1 mmHg AV Mean Grad:      4.0 mmHg LVOT Vmax:         113.00 cm/s LVOT Vmean:        74.700 cm/s LVOT VTI:          0.197 m LVOT/AV VTI ratio: 0.76  AORTA Ao Root diam: 2.60 cm MITRAL VALVE MV Area (PHT): 4.83 cm    SHUNTS MV Area VTI:   2.19 cm    Systemic VTI:  0.20 m MV Peak grad:  4.7 mmHg    Systemic Diam: 1.90 cm MV Mean grad:  3.0 mmHg MV Vmax:       1.08 m/s MV Vmean:      77.5 cm/s MV Decel Time: 157 msec MV E velocity: 89.10 cm/s MV A velocity: 85.70 cm/s MV E/A ratio:  1.04 Harrell Gave End MD Electronically signed by Nelva Bush MD Signature Date/Time: 07/28/2021/12:52:46 PM    Final       ASSESSMENT & PLAN:  1. Encounter for antineoplastic chemotherapy   2. Invasive carcinoma of breast ([place])   3. Hypokalemia    Cancer Staging  Invasive carcinoma of breast ([place]) Staging form: Breast, AJCC 8th Edition - Pathologic stage from 04/22/2021: Stage IA (pT1c, pN0, cM0, G2, ER+, PR+, HER2+) - Signed by Earlie Server, MD on 04/22/2021    Cancer Staging  Invasive carcinoma of breast [facility]) Staging form: Breast, AJCC 8th Edition - Pathologic stage from 04/22/2021: Stage IA (pT1c, pN0, cM0, G2, ER+, PR+, HER2+) - Signed by Earlie Server, MD on 04/22/2021  #Left invasive carcinoma of  breast,pT1c pN0 cM0 status postlumpectomy and sentinel lymph node biopsy. HER2 positive, ER 11-50% positive,  PR 1-10% positive.   Patient has completed adjuvant chemotherapy with Taxol weekly x12. Proceed with maintenance trastuzumab every 3 weeks. Will refer patient to establish care with radiation oncology for adjuvant radiation.  After she finishes radiation, we will start her on adjuvant endocrine therapy.  07/28/2021, echocardiogram showed LVEF is normal 60 to 65%.  Hypokalemia, mild. K is 3.4. continue oral potassium chloride, advised patient to increase potassium chloride to 20 M EQ daily.    We spent sufficient time to discuss many aspect of care, questions were answered to patient's satisfaction.   All questions were answered. The patient knows to call the clinic with any problems questions or concerns.  cc Kirk Ruths, MD   Follow-up in  3 weeks lab MD trastuzumab maintenance.  Earlie Server, MD, PhD. 08/12/2021

## 2021-08-18 ENCOUNTER — Other Ambulatory Visit: Payer: Self-pay | Admitting: Oncology

## 2021-08-18 NOTE — Telephone Encounter (Signed)
  Component Ref Range & Units 6 d ago  (08/12/21) 2 wk ago  (08/03/21) 3 wk ago  (07/27/21) 4 wk ago  (07/20/21) 1 mo ago  (07/13/21) 1 mo ago  (07/06/21) 1 mo ago  (06/29/21)  Potassium 3.5 - 5.1 mmol/L 3.4 Low   3.7  3.2 Low

## 2021-08-19 ENCOUNTER — Other Ambulatory Visit: Payer: Self-pay

## 2021-08-19 ENCOUNTER — Ambulatory Visit
Admission: RE | Admit: 2021-08-19 | Discharge: 2021-08-19 | Disposition: A | Discharge status: Home / Self Care | Payer: Medicare Other | Source: Ambulatory Visit | Attending: Radiation Oncology | Admitting: Radiation Oncology

## 2021-08-19 ENCOUNTER — Ambulatory Visit: Payer: Medicare Other | Admitting: Radiation Oncology

## 2021-08-19 ENCOUNTER — Encounter: Payer: Self-pay | Admitting: Radiation Oncology

## 2021-08-19 VITALS — BP 120/78 | HR 93 | Temp 98.6°F | Wt 115.4 lb

## 2021-08-19 DIAGNOSIS — C50412 Malignant neoplasm of upper-outer quadrant of left female breast: Secondary | ICD-10-CM | POA: Insufficient documentation

## 2021-08-19 DIAGNOSIS — C50919 Malignant neoplasm of unspecified site of unspecified female breast: Secondary | ICD-10-CM

## 2021-08-19 DIAGNOSIS — Q909 Down syndrome, unspecified: Secondary | ICD-10-CM | POA: Diagnosis not present

## 2021-08-19 DIAGNOSIS — Z79899 Other long term (current) drug therapy: Secondary | ICD-10-CM | POA: Insufficient documentation

## 2021-08-19 DIAGNOSIS — Z9221 Personal history of antineoplastic chemotherapy: Secondary | ICD-10-CM | POA: Diagnosis not present

## 2021-08-19 DIAGNOSIS — Z170 Estrogen receptor positive status [ER+]: Secondary | ICD-10-CM | POA: Insufficient documentation

## 2021-08-19 DIAGNOSIS — Z923 Personal history of irradiation: Secondary | ICD-10-CM | POA: Insufficient documentation

## 2021-08-19 NOTE — Consult Note (Signed)
NEW PATIENT EVALUATION  Name: Anna Banks  MRN: [id number]  Date:   08/19/2021     DOB: 1976-01-29   This 45 y.o. female patient presents to the clinic for initial evaluation of stage Ia (T1 cN0 M0) ER low PR positive and HER2/neu overexpressed.  By [facility]  REFERRING PHYSICIAN: Kirk Ruths, MD  CHIEF COMPLAINT:  Chief Complaint  Patient presents with   Breast Cancer    DIAGNOSIS: The encounter diagnosis was Invasive carcinoma of breast ([place]).   PREVIOUS INVESTIGATIONS:  Pathology report reviewed Mammogram and ultrasound reviewed Clinical notes reviewed  HPI: Patient is a [place] 45 year old female who has wave interpreter since she does speak [place] well.  She presented with an abnormal mammogram of the left breast showing a 10 mm mass at 2:00 8 cm from the nipple..  Pathology showed grade 2 invasive mammary carcinoma of no special type ER low positive PR low positive and HER2/neu positive by IHC at 3+.  She underwent wide local excision and sentinel node biopsy 4 lymph nodes were all negative for metastatic disease.  Tumor was overall 11 x 8 mm.  Margins were clear at 3 mm she did have DCIS margins for that were also clear at 3 mm.  She is completed adjuvant radiation therapy with Taxol x12.  She also will proceed with maintenance trastuzumab.  She is tolerating all that well.  She is now referred to ration oncology for consideration of treatment.  She is doing well specifically denies breast tenderness cough or bone pain.  PLANNED TREATMENT REGIMEN: Hypofractionated left whole breast radiation  PAST MEDICAL HISTORY:  has a past medical history of Down syndrome and Seizure ([place]).    PAST SURGICAL HISTORY:  Past Surgical History:  Procedure Laterality Date   BREAST BIOPSY Left 03/10/2021   [place] BX,ribbon clip, IMC   BREAST LUMPECTOMY,RADIO FREQ LOCALIZER,AXILLARY SENTINEL LYMPH NODE BIOPSY Left 04/09/2021   Procedure: BREAST LUMPECTOMY,RADIO FREQ LOCALIZER,AXILLARY  SENTINEL LYMPH NODE BIOPSY;  Surgeon: Benjamine Sprague, DO;  Location: [facility] ORS;  Service: General;  Laterality: Left;    FAMILY HISTORY: family history is not on file.  SOCIAL HISTORY:  reports that she has never smoked. She has never used smokeless tobacco. She reports that she does not currently use drugs. She reports that she does not drink alcohol.  ALLERGIES: Patient has no known allergies.  MEDICATIONS:  Current Outpatient Medications  Medication Sig Dispense Refill   acetaminophen (TYLENOL) 325 MG tablet Take 325-650 mg by mouth every 6 (six) hours as needed for moderate pain.     ethosuximide (ZARONTIN) 250 MG capsule Take 250 mg by mouth 2 (two) times daily.     KLOR-CON M20 20 MEQ tablet TAKE 1 TABLET BY MOUTH EVERY DAY 30 tablet 0   ondansetron (ZOFRAN) 8 MG tablet Take 1 tablet (8 mg total) by mouth 2 (two) times daily as needed (Nausea or vomiting). 30 tablet 1   VITAMIN D PO Take 1 capsule by mouth daily.     HYDROcodone-acetaminophen (NORCO) 5-325 MG tablet Take 1 tablet by mouth every 6 (six) hours as needed for up to 6 doses for moderate pain. (Patient not taking: Reported on 08/12/2021) 6 tablet 0   No current facility-administered medications for this encounter.    ECOG PERFORMANCE STATUS:  0 - Asymptomatic  REVIEW OF SYSTEMS: Patient denies any weight loss, fatigue, weakness, fever, chills or night sweats. Patient denies any loss of vision, blurred vision. Patient denies any ringing  of the ears  or hearing loss. No irregular heartbeat. Patient denies heart murmur or history of fainting. Patient denies any chest pain or pain radiating to her upper extremities. Patient denies any shortness of breath, difficulty breathing at night, cough or hemoptysis. Patient denies any swelling in the lower legs. Patient denies any nausea vomiting, vomiting of blood, or coffee ground material in the vomitus. Patient denies any stomach pain. Patient states has had normal bowel movements no  significant constipation or diarrhea. Patient denies any dysuria, hematuria or significant nocturia. Patient denies any problems walking, swelling in the joints or loss of balance. Patient denies any skin changes, loss of hair or loss of weight. Patient denies any excessive worrying or anxiety or significant depression. Patient denies any problems with insomnia. Patient denies excessive thirst, polyuria, polydipsia. Patient denies any swollen glands, patient denies easy bruising or easy bleeding. Patient denies any recent infections, allergies or URI. Patient "s visual fields have not changed significantly in recent time.   PHYSICAL EXAM: BP 120/78   Pulse 93   Temp 98.6 F (37 C) (Tympanic)   Wt 115 lb 6.4 oz (52.3 kg)   BMI 21.80 kg/m  She status post wide local excision of the left breast.  No dominant masses noted in either breast.  No axillary or supraclavicular adenopathy is appreciated.  Well-developed well-nourished patient in NAD. HEENT reveals PERLA, EOMI, discs not visualized.  Oral cavity is clear. No oral mucosal lesions are identified. Neck is clear without evidence of cervical or supraclavicular adenopathy. Lungs are clear to A&P. Cardiac examination is essentially unremarkable with regular rate and rhythm without murmur rub or thrill. Abdomen is benign with no organomegaly or masses noted. Motor sensory and DTR levels are equal and symmetric in the upper and lower extremities. Cranial nerves II through XII are grossly intact. Proprioception is intact. No peripheral adenopathy or edema is identified. No motor or sensory levels are noted. Crude visual fields are within normal range.  LABORATORY DATA: Pathology report reviewed    RADIOLOGY RESULTS: Mammogram and ultrasound reviewed compatible with above-stated findings   IMPRESSION: Stage Ia (T1 cN0 M0) invasive mammary carcinoma of the left breast HER2/neu overexpressed in 45 year old female now completing adjuvant  chemotherapy  PLAN: This time I recommended a hypofractionated course over 3 weeks to her left breast.  I would also boost her scar another 1000 cGy using electron beam.  Risks and benefits of treatment occluding skin reaction fatigue alteration blood counts possible occlusion of superficial lung all were described in detail to the patient.  She seems to comprehend my recommendations well.  I personally set up and ordered CT simulation for next week.  Patient will continue on immunotherapy under medical oncology's direction.  Patient knows to call with any concerns.  I would like to take this opportunity to thank you for allowing me to participate in the care of your patient.Noreene Filbert, MD

## 2021-08-31 ENCOUNTER — Ambulatory Visit
Admission: RE | Admit: 2021-08-31 | Discharge: 2021-08-31 | Disposition: A | Discharge status: Home / Self Care | Payer: Medicare Other | Source: Ambulatory Visit | Attending: Radiation Oncology | Admitting: Radiation Oncology

## 2021-08-31 DIAGNOSIS — C50412 Malignant neoplasm of upper-outer quadrant of left female breast: Secondary | ICD-10-CM | POA: Diagnosis present

## 2021-08-31 DIAGNOSIS — Z510 Encounter for antineoplastic radiation therapy: Secondary | ICD-10-CM | POA: Insufficient documentation

## 2021-09-01 ENCOUNTER — Other Ambulatory Visit: Payer: Self-pay | Admitting: Oncology

## 2021-09-01 NOTE — Telephone Encounter (Signed)
Component Ref Range & Units 2 wk ago  (08/12/21) 4 wk ago  (08/03/21) 1 mo ago  (07/27/21) 1 mo ago  (07/20/21) 1 mo ago  (07/13/21) 1 mo ago  (07/06/21) 2 mo ago  (06/29/21)  Potassium 3.5 - 5.1 mmol/L 3.4 Low   3.7  3.2 Low   3.8  4.1  3.7  3.5

## 2021-09-02 ENCOUNTER — Inpatient Hospital Stay (HOSPITAL_BASED_OUTPATIENT_CLINIC_OR_DEPARTMENT_OTHER): Payer: Medicare Other | Admitting: Oncology

## 2021-09-02 ENCOUNTER — Other Ambulatory Visit: Payer: Self-pay

## 2021-09-02 ENCOUNTER — Inpatient Hospital Stay: Payer: Medicare Other | Attending: Oncology

## 2021-09-02 ENCOUNTER — Encounter: Payer: Self-pay | Admitting: Oncology

## 2021-09-02 ENCOUNTER — Inpatient Hospital Stay: Payer: Medicare Other

## 2021-09-02 VITALS — BP 120/73 | HR 93 | Temp 98.5°F | Resp 18 | Wt 110.6 lb

## 2021-09-02 DIAGNOSIS — C50919 Malignant neoplasm of unspecified site of unspecified female breast: Secondary | ICD-10-CM | POA: Diagnosis not present

## 2021-09-02 DIAGNOSIS — E876 Hypokalemia: Secondary | ICD-10-CM

## 2021-09-02 DIAGNOSIS — Z170 Estrogen receptor positive status [ER+]: Secondary | ICD-10-CM | POA: Diagnosis not present

## 2021-09-02 DIAGNOSIS — Z5112 Encounter for antineoplastic immunotherapy: Secondary | ICD-10-CM | POA: Insufficient documentation

## 2021-09-02 DIAGNOSIS — C50412 Malignant neoplasm of upper-outer quadrant of left female breast: Secondary | ICD-10-CM | POA: Diagnosis present

## 2021-09-02 DIAGNOSIS — Z5111 Encounter for antineoplastic chemotherapy: Secondary | ICD-10-CM

## 2021-09-02 DIAGNOSIS — Z510 Encounter for antineoplastic radiation therapy: Secondary | ICD-10-CM | POA: Diagnosis not present

## 2021-09-02 LAB — COMPREHENSIVE METABOLIC PANEL
ALT: 16 U/L (ref 0–44)
AST: 26 U/L (ref 15–41)
Albumin: 4.3 g/dL (ref 3.5–5.0)
Alkaline Phosphatase: 50 U/L (ref 38–126)
Anion gap: 12 (ref 5–15)
BUN: 14 mg/dL (ref 6–20)
CO2: 22 mmol/L (ref 22–32)
Calcium: 9.9 mg/dL (ref 8.9–10.3)
Chloride: 104 mmol/L (ref 98–111)
Creatinine, Ser: 0.67 mg/dL (ref 0.44–1.00)
GFR, Estimated: >60 mL/min (ref >60)
Glucose, Bld: 102 mg/dL — ABNORMAL HIGH (ref 70–99)
Potassium: 3.4 mmol/L — ABNORMAL LOW (ref 3.5–5.1)
Sodium: 138 mmol/L (ref 135–145)
Total Bilirubin: 0.6 mg/dL (ref 0.3–1.2)
Total Protein: 7.4 g/dL (ref 6.5–8.1)

## 2021-09-02 LAB — CBC WITH DIFFERENTIAL/PLATELET
Abs Immature Granulocytes: 0.01 10*3/uL (ref 0.00–0.07)
Basophils Absolute: 0.1 10*3/uL (ref 0.0–0.1)
Basophils Relative: 1 %
Eosinophils Absolute: 0.3 10*3/uL (ref 0.0–0.5)
Eosinophils Relative: 4 %
HCT: 36.3 % (ref 36.0–46.0)
Hemoglobin: 12 g/dL (ref 12.0–15.0)
Immature Granulocytes: 0 %
Lymphocytes Relative: 23 %
Lymphs Abs: 1.4 10*3/uL (ref 0.7–4.0)
MCH: 32 pg (ref 26.0–34.0)
MCHC: 33.1 g/dL (ref 30.0–36.0)
MCV: 96.8 fL (ref 80.0–100.0)
Monocytes Absolute: 0.5 10*3/uL (ref 0.1–1.0)
Monocytes Relative: 8 %
Neutro Abs: 3.9 10*3/uL (ref 1.7–7.7)
Neutrophils Relative %: 64 %
Platelets: 337 10*3/uL (ref 150–400)
RBC: 3.75 MIL/uL — ABNORMAL LOW (ref 3.87–5.11)
RDW: 12.5 % (ref 11.5–15.5)
WBC: 6.1 10*3/uL (ref 4.0–10.5)
nRBC: 0 % (ref 0.0–0.2)

## 2021-09-02 MED ORDER — DIPHENHYDRAMINE HCL 25 MG PO CAPS
50.0000 mg | ORAL_CAPSULE | Freq: Once | ORAL | Status: AC
  Administered 2021-09-02: 10:00:00 50 mg via ORAL
  Filled 2021-09-02: qty 2

## 2021-09-02 MED ORDER — ACETAMINOPHEN 325 MG PO TABS
650.0000 mg | ORAL_TABLET | Freq: Once | ORAL | Status: AC
  Administered 2021-09-02: 10:00:00 650 mg via ORAL
  Filled 2021-09-02: qty 2

## 2021-09-02 MED ORDER — TRASTUZUMAB-DKST CHEMO 150 MG IV SOLR
300.0000 mg | Freq: Once | INTRAVENOUS | Status: AC
  Administered 2021-09-02: 11:00:00 300 mg via INTRAVENOUS
  Filled 2021-09-02: qty 14.29

## 2021-09-02 MED ORDER — SODIUM CHLORIDE 0.9 % IV SOLN
Freq: Once | INTRAVENOUS | Status: AC
  Filled 2021-09-02: qty 250

## 2021-09-02 NOTE — Progress Notes (Signed)
Hematology/Oncology follow up  note [phone] [phone]   Patient Care Team: Kirk Ruths, MD as PCP - General (Internal Medicine) Earlie Server, MD as Consulting Physician (Oncology)  REFERRING PROVIDER: Kirk Ruths, MD  CHIEF COMPLAINTS/REASON FOR VISIT:  Follow up for treatment of breast cancer  HISTORY OF PRESENTING ILLNESS:   Anna Banks is a  45 y.o.  female with PMH listed below was seen in consultation at the request of  Kirk Ruths, MD  for evaluation of breast cancer.   Patient was accompanied by her half sister today. Per sister, patient has history of seizure since childhood and has intellectual/cognitive impairment. She is illiterate. She follows instructions from her family members.  She lives with her mother and her half sister who takes of her and helps her to make medical decision.   They waived interpretor. Both patient and her half sister speak [place].  Menarche 73 years of age. G0P0. No child, no previous pregnancy. Per her sister, patient is not sexually active No prior use of birth control pills or hormone replacement therapy. Denies previous history of biopsy.  02/16/2021, screening mammogram showed calcification in the right breast needs further evaluation.  Left breast possible mass with distortion requires further evaluation. 02/26/2021, left breast showed a 10 mm indeterminate mass, 2:00, 8 cm from nipple.  No mammographic evidence of malignancy in the right breast.  No left axillary lymphadenopathy. 03/10/2021, patient underwent ultrasound-guided biopsy of left breast mass.  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  ER low positive [1-10%], PR positive [1-10%], HER2 positive by IHC 3+  # 04/09/2021, status postlumpectomy and sentinel lymph node biopsy. Invasive mammary carcinoma, no special type, 4 lymph nodes all negative for  metastatic carcinoma.  DCIS present.  LVI not identified.Grade 2, all margins negative for invasive carcinoma. pT1c pN0, ER[staining was repeated on surgical specimen] [11-50%]+ PR [1-10%]+, HER 2+  Baseline echocardiogram showed normal LVEF.   07/28/2021, echocardiogram showed LVEF is normal 60 to 65%.  INTERVAL HISTORY Anna Banks is a 45 y.o. female who has above history reviewed by me today presents for follow up visit for chemotherapy for HER2 positive breast cancer  Patient is here by herself today. [place] interpreter online service was used. Denies any nausea vomiting diarrhea.  Reports feeling well.  No new complaints .   Review of Systems  Unable to perform ROS: Other (intellectual/Cognitive impairment)  Constitutional:  Negative for appetite change, chills, fatigue and fever.  HENT:   Negative for hearing loss and voice change.   Eyes:  Negative for eye problems.  Respiratory:  Negative for chest tightness and cough.   Cardiovascular:  Negative for chest pain.  Gastrointestinal:  Negative for abdominal distention, abdominal pain and blood in stool.  Endocrine: Negative for hot flashes.  Genitourinary:  Negative for difficulty urinating and frequency.   Musculoskeletal:  Negative for arthralgias.  Skin:  Negative for itching and rash.  Neurological:  Negative for extremity weakness.  Hematological:  Negative for adenopathy.  Psychiatric/Behavioral:  Negative for confusion.    MEDICAL HISTORY:  Past Medical History:  Diagnosis Date   Down syndrome    Seizure [facility])     SURGICAL HISTORY: Past Surgical History:  Procedure Laterality Date   BREAST BIOPSY Left 03/10/2021   [place] BX,ribbon clip, [place]   BREAST LUMPECTOMY,RADIO FREQ LOCALIZER,AXILLARY SENTINEL LYMPH NODE BIOPSY Left 04/09/2021   Procedure: BREAST [id number],LEXNT ZGYF  LOCALIZER,AXILLARY SENTINEL LYMPH NODE BIOPSY;  Surgeon: Benjamine Sprague, DO;  Location: [facility] ORS;  Service: General;  Laterality: Left;     SOCIAL HISTORY: Social History   Socioeconomic History   Marital status: Single    Spouse name: Not on file   Number of children: Not on file   Years of education: Not on file   Highest education level: Not on file  Occupational History   Not on file  Tobacco Use   Smoking status: Never   Smokeless tobacco: Never  Vaping Use   Vaping Use: Never used  Substance and Sexual Activity   Alcohol use: Never   Drug use: Not Currently   Sexual activity: Not on file  Other Topics Concern   Not on file  Social History Narrative   Not on file   Social Determinants of Health   Financial Resource Strain: Not on file  Food Insecurity: Not on file  Transportation Needs: Not on file  Physical Activity: Not on file  Stress: Not on file  Social Connections: Not on file  Intimate Partner Violence: Not on file    FAMILY HISTORY: History reviewed. No pertinent family history.  ALLERGIES:  has No Known Allergies.  MEDICATIONS:  Current Outpatient Medications  Medication Sig Dispense Refill   acetaminophen (TYLENOL) 325 MG tablet Take 325-650 mg by mouth every 6 (six) hours as needed for moderate pain.     ethosuximide (ZARONTIN) 250 MG capsule Take 250 mg by mouth 2 (two) times daily.     ondansetron (ZOFRAN) 8 MG tablet Take 1 tablet (8 mg total) by mouth 2 (two) times daily as needed (Nausea or vomiting). 30 tablet 1   VITAMIN D PO Take 1 capsule by mouth daily.     HYDROcodone-acetaminophen (NORCO) 5-325 MG tablet Take 1 tablet by mouth every 6 (six) hours as needed for up to 6 doses for moderate pain. (Patient not taking: Reported on 08/12/2021) 6 tablet 0   KLOR-CON M20 20 MEQ tablet TAKE 1 TABLET BY MOUTH EVERY DAY 90 tablet 1   No current facility-administered medications for this visit.     PHYSICAL EXAMINATION: ECOG PERFORMANCE STATUS: 0 - Asymptomatic Vitals:   09/02/21 0907  BP: 120/73  Pulse: 93  Resp: 18  Temp: 98.5 F (36.9 C)   Filed Weights   09/02/21  0907  Weight: 110 lb 9.6 oz (50.2 kg)    Physical Exam Constitutional:      General: She is not in acute distress. HENT:     Head: Normocephalic and atraumatic.  Eyes:     General: No scleral icterus. Cardiovascular:     Rate and Rhythm: Regular rhythm.     Heart sounds: Normal heart sounds.  Pulmonary:     Effort: Pulmonary effort is normal. No respiratory distress.     Breath sounds: No wheezing.  Abdominal:     General: Bowel sounds are normal. There is no distension.     Palpations: Abdomen is soft.  Musculoskeletal:        General: No deformity. Normal range of motion.     Cervical back: Normal range of motion and neck supple.  Skin:    General: Skin is warm and dry.     Findings: No erythema or rash.  Neurological:     Mental Status: She is alert. Mental status is at baseline.     Cranial Nerves: No cranial nerve deficit.     Coordination: Coordination normal.  Psychiatric:  Mood and Affect: Mood normal.   .   LABORATORY DATA:  I have reviewed the data as listed Lab Results  Component Value Date   WBC 6.1 09/02/2021   HGB 12.0 09/02/2021   HCT 36.3 09/02/2021   MCV 96.8 09/02/2021   PLT 337 09/02/2021   Recent Labs    08/03/21 0847 08/12/21 0828 09/02/21 0849  NA 136 136 138  K 3.7 3.4* 3.4*  CL 103 104 104  CO2 25 24 22   GLUCOSE 159* 135* 102*  BUN 15 14 14   CREATININE 0.66 0.73 0.67  CALCIUM 9.6 8.9 9.9  GFRNONAA >60 >60 >60  PROT 7.4 7.3 7.4  ALBUMIN 4.0 4.1 4.3  AST 25 26 26   ALT 20 18 16   ALKPHOS 56 55 50  BILITOT 0.3 0.6 0.6    Iron/TIBC/Ferritin/ %Sat No results found for: IRON, TIBC, FERRITIN, IRONPCTSAT    RADIOGRAPHIC STUDIES: I have personally reviewed the radiological images as listed and agreed with the findings in the report. No results found.    ASSESSMENT & PLAN:  1. Invasive carcinoma of breast ([place])   2. Encounter for antineoplastic chemotherapy   3. Hypokalemia    Cancer Staging  Invasive carcinoma of  breast ([place]) Staging form: Breast, AJCC 8th Edition - Pathologic stage from 04/22/2021: Stage IA (pT1c, pN0, cM0, G2, ER+, PR+, HER2+) - Signed by Earlie Server, MD on 04/22/2021    Cancer Staging  Invasive carcinoma of breast [facility]) Staging form: Breast, AJCC 8th Edition - Pathologic stage from 04/22/2021: Stage IA (pT1c, pN0, cM0, G2, ER+, PR+, HER2+) - Signed by Earlie Server, MD on 04/22/2021  #Left invasive carcinoma of breast,pT1c pN0 cM0 status postlumpectomy and sentinel lymph node biopsy. HER2 positive, ER 11-50% positive,  PR 1-10% positive.   Patient has completed adjuvant chemotherapy with Taxol weekly x12. Proceed with maintenance trastuzumab every 3 weeks. Labs reviewed and discussed with patient.  Proceed with trastuzumab today. Patient has establish care with radiation oncology.  We will start adjuvant radiation in early January.    Hypokalemia, mild. K is 3.4.  Continue potassium chloride,  20 M EQ daily.    We spent sufficient time to discuss many aspect of care, questions were answered to patient's satisfaction.   All questions were answered. The patient knows to call the clinic with any problems questions or concerns.  cc Kirk Ruths, MD   Follow-up in  3 weeks lab MD trastuzumab maintenance.  Earlie Server, MD, PhD. 09/02/2021

## 2021-09-02 NOTE — Progress Notes (Signed)
Pt here for follow up. No new concerns voiced.   

## 2021-09-08 ENCOUNTER — Ambulatory Visit: Admission: RE | Admit: 2021-09-08 | Payer: Medicare Other | Source: Ambulatory Visit

## 2021-09-08 DIAGNOSIS — Z510 Encounter for antineoplastic radiation therapy: Secondary | ICD-10-CM | POA: Diagnosis not present

## 2021-09-09 ENCOUNTER — Ambulatory Visit
Admission: RE | Admit: 2021-09-09 | Discharge: 2021-09-09 | Disposition: A | Discharge status: Home / Self Care | Payer: Medicare Other | Source: Ambulatory Visit | Attending: Radiation Oncology | Admitting: Radiation Oncology

## 2021-09-09 DIAGNOSIS — Z510 Encounter for antineoplastic radiation therapy: Secondary | ICD-10-CM | POA: Diagnosis not present

## 2021-09-10 ENCOUNTER — Ambulatory Visit
Admission: RE | Admit: 2021-09-10 | Discharge: 2021-09-10 | Disposition: A | Discharge status: Home / Self Care | Payer: Medicare Other | Source: Ambulatory Visit | Attending: Radiation Oncology | Admitting: Radiation Oncology

## 2021-09-10 DIAGNOSIS — Z510 Encounter for antineoplastic radiation therapy: Secondary | ICD-10-CM | POA: Diagnosis not present

## 2021-09-11 ENCOUNTER — Ambulatory Visit
Admission: RE | Admit: 2021-09-11 | Discharge: 2021-09-11 | Disposition: A | Discharge status: Home / Self Care | Payer: Medicare Other | Source: Ambulatory Visit | Attending: Radiation Oncology | Admitting: Radiation Oncology

## 2021-09-11 DIAGNOSIS — Z510 Encounter for antineoplastic radiation therapy: Secondary | ICD-10-CM | POA: Diagnosis not present

## 2021-09-15 ENCOUNTER — Ambulatory Visit
Admission: RE | Admit: 2021-09-15 | Discharge: 2021-09-15 | Disposition: A | Discharge status: Home / Self Care | Payer: Medicare Other | Source: Ambulatory Visit | Attending: Radiation Oncology | Admitting: Radiation Oncology

## 2021-09-15 DIAGNOSIS — C50412 Malignant neoplasm of upper-outer quadrant of left female breast: Secondary | ICD-10-CM | POA: Diagnosis present

## 2021-09-15 DIAGNOSIS — Z510 Encounter for antineoplastic radiation therapy: Secondary | ICD-10-CM | POA: Insufficient documentation

## 2021-09-16 ENCOUNTER — Ambulatory Visit
Admission: RE | Admit: 2021-09-16 | Discharge: 2021-09-16 | Disposition: A | Discharge status: Home / Self Care | Payer: Medicare Other | Source: Ambulatory Visit | Attending: Radiation Oncology | Admitting: Radiation Oncology

## 2021-09-16 DIAGNOSIS — Z510 Encounter for antineoplastic radiation therapy: Secondary | ICD-10-CM | POA: Diagnosis not present

## 2021-09-17 ENCOUNTER — Ambulatory Visit
Admission: RE | Admit: 2021-09-17 | Discharge: 2021-09-17 | Disposition: A | Discharge status: Home / Self Care | Payer: Medicare Other | Source: Ambulatory Visit | Attending: Radiation Oncology | Admitting: Radiation Oncology

## 2021-09-17 DIAGNOSIS — Z510 Encounter for antineoplastic radiation therapy: Secondary | ICD-10-CM | POA: Diagnosis not present

## 2021-09-18 ENCOUNTER — Ambulatory Visit
Admission: RE | Admit: 2021-09-18 | Discharge: 2021-09-18 | Disposition: A | Discharge status: Home / Self Care | Payer: Medicare Other | Source: Ambulatory Visit | Attending: Radiation Oncology | Admitting: Radiation Oncology

## 2021-09-18 DIAGNOSIS — Z510 Encounter for antineoplastic radiation therapy: Secondary | ICD-10-CM | POA: Diagnosis not present

## 2021-09-21 ENCOUNTER — Ambulatory Visit
Admission: RE | Admit: 2021-09-21 | Discharge: 2021-09-21 | Disposition: A | Discharge status: Home / Self Care | Payer: Medicare Other | Source: Ambulatory Visit | Attending: Radiation Oncology | Admitting: Radiation Oncology

## 2021-09-21 DIAGNOSIS — Z510 Encounter for antineoplastic radiation therapy: Secondary | ICD-10-CM | POA: Diagnosis not present

## 2021-09-22 ENCOUNTER — Ambulatory Visit
Admission: RE | Admit: 2021-09-22 | Discharge: 2021-09-22 | Disposition: A | Discharge status: Home / Self Care | Payer: Medicare Other | Source: Ambulatory Visit | Attending: Radiation Oncology | Admitting: Radiation Oncology

## 2021-09-22 DIAGNOSIS — Z510 Encounter for antineoplastic radiation therapy: Secondary | ICD-10-CM | POA: Diagnosis not present

## 2021-09-23 ENCOUNTER — Inpatient Hospital Stay: Payer: Medicare Other

## 2021-09-23 ENCOUNTER — Ambulatory Visit: Payer: Medicare Other

## 2021-09-23 ENCOUNTER — Ambulatory Visit
Admission: RE | Admit: 2021-09-23 | Discharge: 2021-09-23 | Disposition: A | Discharge status: Home / Self Care | Payer: Medicare Other | Source: Ambulatory Visit | Attending: Radiation Oncology | Admitting: Radiation Oncology

## 2021-09-23 ENCOUNTER — Encounter: Payer: Self-pay | Admitting: Oncology

## 2021-09-23 ENCOUNTER — Inpatient Hospital Stay (HOSPITAL_BASED_OUTPATIENT_CLINIC_OR_DEPARTMENT_OTHER): Payer: Medicare Other | Admitting: Oncology

## 2021-09-23 ENCOUNTER — Other Ambulatory Visit: Payer: Self-pay

## 2021-09-23 VITALS — BP 118/79 | HR 99 | Temp 98.3°F | Wt 108.0 lb

## 2021-09-23 DIAGNOSIS — E876 Hypokalemia: Secondary | ICD-10-CM | POA: Insufficient documentation

## 2021-09-23 DIAGNOSIS — C50412 Malignant neoplasm of upper-outer quadrant of left female breast: Secondary | ICD-10-CM | POA: Insufficient documentation

## 2021-09-23 DIAGNOSIS — Z510 Encounter for antineoplastic radiation therapy: Secondary | ICD-10-CM | POA: Diagnosis not present

## 2021-09-23 DIAGNOSIS — Z5112 Encounter for antineoplastic immunotherapy: Secondary | ICD-10-CM | POA: Insufficient documentation

## 2021-09-23 DIAGNOSIS — C50919 Malignant neoplasm of unspecified site of unspecified female breast: Secondary | ICD-10-CM

## 2021-09-23 DIAGNOSIS — Z170 Estrogen receptor positive status [ER+]: Secondary | ICD-10-CM | POA: Insufficient documentation

## 2021-09-23 DIAGNOSIS — Z5111 Encounter for antineoplastic chemotherapy: Secondary | ICD-10-CM

## 2021-09-23 LAB — CBC WITH DIFFERENTIAL/PLATELET
Abs Immature Granulocytes: 0.02 10*3/uL (ref 0.00–0.07)
Basophils Absolute: 0 10*3/uL (ref 0.0–0.1)
Basophils Relative: 1 %
Eosinophils Absolute: 0.2 10*3/uL (ref 0.0–0.5)
Eosinophils Relative: 4 %
HCT: 40.7 % (ref 36.0–46.0)
Hemoglobin: 13.1 g/dL (ref 12.0–15.0)
Immature Granulocytes: 0 %
Lymphocytes Relative: 34 %
Lymphs Abs: 1.6 10*3/uL (ref 0.7–4.0)
MCH: 31.9 pg (ref 26.0–34.0)
MCHC: 32.2 g/dL (ref 30.0–36.0)
MCV: 99 fL (ref 80.0–100.0)
Monocytes Absolute: 0.4 10*3/uL (ref 0.1–1.0)
Monocytes Relative: 8 %
Neutro Abs: 2.3 10*3/uL (ref 1.7–7.7)
Neutrophils Relative %: 53 %
Platelets: 321 10*3/uL (ref 150–400)
RBC: 4.11 MIL/uL (ref 3.87–5.11)
RDW: 12.1 % (ref 11.5–15.5)
WBC: 4.5 10*3/uL (ref 4.0–10.5)
nRBC: 0 % (ref 0.0–0.2)

## 2021-09-23 LAB — COMPREHENSIVE METABOLIC PANEL
ALT: 15 U/L (ref 0–44)
AST: 26 U/L (ref 15–41)
Albumin: 4.3 g/dL (ref 3.5–5.0)
Alkaline Phosphatase: 53 U/L (ref 38–126)
Anion gap: 7 (ref 5–15)
BUN: 12 mg/dL (ref 6–20)
CO2: 24 mmol/L (ref 22–32)
Calcium: 9.3 mg/dL (ref 8.9–10.3)
Chloride: 102 mmol/L (ref 98–111)
Creatinine, Ser: 0.64 mg/dL (ref 0.44–1.00)
GFR, Estimated: >60 mL/min (ref >60)
Glucose, Bld: 138 mg/dL — ABNORMAL HIGH (ref 70–99)
Potassium: 3.8 mmol/L (ref 3.5–5.1)
Sodium: 133 mmol/L — ABNORMAL LOW (ref 135–145)
Total Bilirubin: 0.4 mg/dL (ref 0.3–1.2)
Total Protein: 7.8 g/dL (ref 6.5–8.1)

## 2021-09-23 MED ORDER — DIPHENHYDRAMINE HCL 25 MG PO CAPS
50.0000 mg | ORAL_CAPSULE | Freq: Once | ORAL | Status: AC
  Administered 2021-09-23: 50 mg via ORAL
  Filled 2021-09-23: qty 2

## 2021-09-23 MED ORDER — TRASTUZUMAB-DKST CHEMO 150 MG IV SOLR
300.0000 mg | Freq: Once | INTRAVENOUS | Status: AC
  Administered 2021-09-23: 300 mg via INTRAVENOUS
  Filled 2021-09-23: qty 14.29

## 2021-09-23 MED ORDER — ACETAMINOPHEN 325 MG PO TABS
650.0000 mg | ORAL_TABLET | Freq: Once | ORAL | Status: AC
  Administered 2021-09-23: 650 mg via ORAL
  Filled 2021-09-23: qty 2

## 2021-09-23 MED ORDER — SODIUM CHLORIDE 0.9 % IV SOLN
Freq: Once | INTRAVENOUS | Status: AC
  Filled 2021-09-23: qty 250

## 2021-09-23 NOTE — Progress Notes (Signed)
Hematology/Oncology follow up  note [phone] [phone]   Patient Care Team: Kirk Ruths, MD as PCP - General (Internal Medicine) Earlie Server, MD as Consulting Physician (Oncology)  REFERRING PROVIDER: Kirk Ruths, MD  CHIEF COMPLAINTS/REASON FOR VISIT:  Follow up for treatment of breast cancer  HISTORY OF PRESENTING ILLNESS:   Anna Banks is a  46 y.o.  female with PMH listed below was seen in consultation at the request of  Kirk Ruths, MD  for evaluation of breast cancer.   Patient was accompanied by her half sister today. Per sister, patient has history of seizure since childhood and has intellectual/cognitive impairment. She is illiterate. She follows instructions from her family members.  She lives with her mother and her half sister who takes of her and helps her to make medical decision.   They waived interpretor. Both patient and her half sister speak [place].  Menarche 6 years of age. G0P0. No child, no previous pregnancy. Per her sister, patient is not sexually active No prior use of birth control pills or hormone replacement therapy. Denies previous history of biopsy.  02/16/2021, screening mammogram showed calcification in the right breast needs further evaluation.  Left breast possible mass with distortion requires further evaluation. 02/26/2021, left breast showed a 10 mm indeterminate mass, 2:00, 8 cm from nipple.  No mammographic evidence of malignancy in the right breast.  No left axillary lymphadenopathy. 03/10/2021, patient underwent ultrasound-guided biopsy of left breast mass.  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  ER low positive [1-10%], PR positive [1-10%], HER2 positive by IHC 3+  # 04/09/2021, status postlumpectomy and sentinel lymph node biopsy. Invasive mammary carcinoma, no special type, 4 lymph nodes all negative for  metastatic carcinoma.  DCIS present.  LVI not identified.Grade 2, all margins negative for invasive carcinoma. pT1c pN0, ER[staining was repeated on surgical specimen] [11-50%]+ PR [1-10%]+, HER 2+  Baseline echocardiogram showed normal LVEF.   07/28/2021, echocardiogram showed LVEF is normal 60 to 65%.  INTERVAL HISTORY Anna Banks is a 46 y.o. female who has above history reviewed by me today presents for follow up visit for chemotherapy for HER2 positive breast cancer  Patient is here by herself today. [place] interpreter online service was used. Patient reports a few day history of nausea vomiting, symptom has completely resolved.  Today she denies any fever, chills, nausea vomiting..   Review of Systems  Unable to perform ROS: Other (intellectual/Cognitive impairment)  Constitutional:  Negative for appetite change, chills, fatigue and fever.  HENT:   Negative for hearing loss and voice change.   Eyes:  Negative for eye problems.  Respiratory:  Negative for chest tightness and cough.   Cardiovascular:  Negative for chest pain.  Gastrointestinal:  Negative for abdominal distention, abdominal pain and blood in stool.  Endocrine: Negative for hot flashes.  Genitourinary:  Negative for difficulty urinating and frequency.   Musculoskeletal:  Negative for arthralgias.  Skin:  Negative for itching and rash.  Neurological:  Negative for extremity weakness.  Hematological:  Negative for adenopathy.  Psychiatric/Behavioral:  Negative for confusion.    MEDICAL HISTORY:  Past Medical History:  Diagnosis Date   Down syndrome    Seizure [facility])     SURGICAL HISTORY: Past Surgical History:  Procedure Laterality Date   BREAST BIOPSY Left 03/10/2021   [place] BX,ribbon clip, [facility]   BREAST LUMPECTOMY,RADIO FREQ LOCALIZER,AXILLARY SENTINEL LYMPH NODE BIOPSY  Left 04/09/2021   Procedure: BREAST LUMPECTOMY,RADIO FREQ LOCALIZER,AXILLARY SENTINEL LYMPH NODE BIOPSY;  Surgeon: Benjamine Sprague, DO;   Location: [facility] ORS;  Service: General;  Laterality: Left;    SOCIAL HISTORY: Social History   Socioeconomic History   Marital status: Single    Spouse name: Not on file   Number of children: Not on file   Years of education: Not on file   Highest education level: Not on file  Occupational History   Not on file  Tobacco Use   Smoking status: Never   Smokeless tobacco: Never  Vaping Use   Vaping Use: Never used  Substance and Sexual Activity   Alcohol use: Never   Drug use: Not Currently   Sexual activity: Not on file  Other Topics Concern   Not on file  Social History Narrative   Not on file   Social Determinants of Health   Financial Resource Strain: Not on file  Food Insecurity: Not on file  Transportation Needs: Not on file  Physical Activity: Not on file  Stress: Not on file  Social Connections: Not on file  Intimate Partner Violence: Not on file    FAMILY HISTORY: History reviewed. No pertinent family history.  ALLERGIES:  has No Known Allergies.  MEDICATIONS:  Current Outpatient Medications  Medication Sig Dispense Refill   acetaminophen (TYLENOL) 325 MG tablet Take 325-650 mg by mouth every 6 (six) hours as needed for moderate pain.     ethosuximide (ZARONTIN) 250 MG capsule Take 250 mg by mouth 2 (two) times daily.     KLOR-CON M20 20 MEQ tablet TAKE 1 TABLET BY MOUTH EVERY DAY 90 tablet 1   ondansetron (ZOFRAN) 8 MG tablet Take 1 tablet (8 mg total) by mouth 2 (two) times daily as needed (Nausea or vomiting). 30 tablet 1   VITAMIN D PO Take 1 capsule by mouth daily.     HYDROcodone-acetaminophen (NORCO) 5-325 MG tablet Take 1 tablet by mouth every 6 (six) hours as needed for up to 6 doses for moderate pain. (Patient not taking: Reported on 08/12/2021) 6 tablet 0   No current facility-administered medications for this visit.     PHYSICAL EXAMINATION: ECOG PERFORMANCE STATUS: 0 - Asymptomatic Vitals:   09/23/21 0952  BP: 118/79  Pulse: 99  Temp:  98.3 F (36.8 C)   Filed Weights   09/23/21 0952  Weight: 108 lb (49 kg)    Physical Exam Constitutional:      General: She is not in acute distress. HENT:     Head: Normocephalic and atraumatic.  Eyes:     General: No scleral icterus. Cardiovascular:     Rate and Rhythm: Regular rhythm.     Heart sounds: Normal heart sounds.  Pulmonary:     Effort: Pulmonary effort is normal. No respiratory distress.     Breath sounds: No wheezing.  Abdominal:     General: Bowel sounds are normal. There is no distension.     Palpations: Abdomen is soft.  Musculoskeletal:        General: No deformity. Normal range of motion.     Cervical back: Normal range of motion and neck supple.  Skin:    General: Skin is warm and dry.     Findings: No erythema or rash.  Neurological:     Mental Status: She is alert. Mental status is at baseline.     Cranial Nerves: No cranial nerve deficit.     Coordination: Coordination normal.  Psychiatric:  Mood and Affect: Mood normal.   .   LABORATORY DATA:  I have reviewed the data as listed Lab Results  Component Value Date   WBC 4.5 09/23/2021   HGB 13.1 09/23/2021   HCT 40.7 09/23/2021   MCV 99.0 09/23/2021   PLT 321 09/23/2021   Recent Labs    08/12/21 0828 09/02/21 0849 09/23/21 0820  NA 136 138 133*  K 3.4* 3.4* 3.8  CL 104 104 102  CO2 24 22 24   GLUCOSE 135* 102* 138*  BUN 14 14 12   CREATININE 0.73 0.67 0.64  CALCIUM 8.9 9.9 9.3  GFRNONAA >60 >60 >60  PROT 7.3 7.4 7.8  ALBUMIN 4.1 4.3 4.3  AST 26 26 26   ALT 18 16 15   ALKPHOS 55 50 53  BILITOT 0.6 0.6 0.4    Iron/TIBC/Ferritin/ %Sat No results found for: IRON, TIBC, FERRITIN, IRONPCTSAT    RADIOGRAPHIC STUDIES: I have personally reviewed the radiological images as listed and agreed with the findings in the report. No results found.    ASSESSMENT & PLAN:  1. Invasive carcinoma of breast ([place])   2. Encounter for antineoplastic chemotherapy   3. Hypokalemia     Cancer Staging  Invasive carcinoma of breast ([place]) Staging form: Breast, AJCC 8th Edition - Pathologic stage from 04/22/2021: Stage IA (pT1c, pN0, cM0, G2, ER+, PR+, HER2+) - Signed by Earlie Server, MD on 04/22/2021    Cancer Staging  Invasive carcinoma of breast [facility]) Staging form: Breast, AJCC 8th Edition - Pathologic stage from 04/22/2021: Stage IA (pT1c, pN0, cM0, G2, ER+, PR+, HER2+) - Signed by Earlie Server, MD on 04/22/2021  #Left invasive carcinoma of breast,pT1c pN0 cM0 status postlumpectomy and sentinel lymph node biopsy. HER2 positive, ER 11-50% positive,  PR 1-10% positive.   Patient has completed adjuvant chemotherapy with Taxol weekly x12. Labs reviewed and discussed with patient.  Proceed with trastuzumab. Patient is planning to start adjuvant radiation of the breast.   Hypokalemia, potassium has improved.  Continue potassium chloride.20 meq daily.    We spent sufficient time to discuss many aspect of care, questions were answered to patient's satisfaction.   All questions were answered. The patient knows to call the clinic with any problems questions or concerns.  cc Kirk Ruths, MD   Follow-up in  3 weeks lab MD trastuzumab maintenance.  Earlie Server, MD, PhD. 09/23/2021

## 2021-09-24 ENCOUNTER — Ambulatory Visit
Admission: RE | Admit: 2021-09-24 | Discharge: 2021-09-24 | Disposition: A | Discharge status: Home / Self Care | Payer: Medicare Other | Source: Ambulatory Visit | Attending: Radiation Oncology | Admitting: Radiation Oncology

## 2021-09-24 DIAGNOSIS — Z510 Encounter for antineoplastic radiation therapy: Secondary | ICD-10-CM | POA: Diagnosis not present

## 2021-09-25 ENCOUNTER — Ambulatory Visit
Admission: RE | Admit: 2021-09-25 | Discharge: 2021-09-25 | Disposition: A | Discharge status: Home / Self Care | Payer: Medicare Other | Source: Ambulatory Visit | Attending: Radiation Oncology | Admitting: Radiation Oncology

## 2021-09-25 DIAGNOSIS — Z510 Encounter for antineoplastic radiation therapy: Secondary | ICD-10-CM | POA: Diagnosis not present

## 2021-09-28 ENCOUNTER — Ambulatory Visit
Admission: RE | Admit: 2021-09-28 | Discharge: 2021-09-28 | Disposition: A | Discharge status: Home / Self Care | Payer: Medicare Other | Source: Ambulatory Visit | Attending: Radiation Oncology | Admitting: Radiation Oncology

## 2021-09-28 DIAGNOSIS — Z510 Encounter for antineoplastic radiation therapy: Secondary | ICD-10-CM | POA: Diagnosis not present

## 2021-09-29 ENCOUNTER — Ambulatory Visit
Admission: RE | Admit: 2021-09-29 | Discharge: 2021-09-29 | Disposition: A | Discharge status: Home / Self Care | Payer: Medicare Other | Source: Ambulatory Visit | Attending: Radiation Oncology | Admitting: Radiation Oncology

## 2021-09-29 DIAGNOSIS — Z510 Encounter for antineoplastic radiation therapy: Secondary | ICD-10-CM | POA: Diagnosis not present

## 2021-09-30 ENCOUNTER — Ambulatory Visit
Admission: RE | Admit: 2021-09-30 | Discharge: 2021-09-30 | Disposition: A | Discharge status: Home / Self Care | Payer: Medicare Other | Source: Ambulatory Visit | Attending: Radiation Oncology | Admitting: Radiation Oncology

## 2021-09-30 DIAGNOSIS — Z510 Encounter for antineoplastic radiation therapy: Secondary | ICD-10-CM | POA: Diagnosis not present

## 2021-10-01 ENCOUNTER — Ambulatory Visit
Admission: RE | Admit: 2021-10-01 | Discharge: 2021-10-01 | Disposition: A | Discharge status: Home / Self Care | Payer: Medicare Other | Source: Ambulatory Visit | Attending: Radiation Oncology | Admitting: Radiation Oncology

## 2021-10-01 DIAGNOSIS — Z510 Encounter for antineoplastic radiation therapy: Secondary | ICD-10-CM | POA: Diagnosis not present

## 2021-10-02 ENCOUNTER — Ambulatory Visit
Admission: RE | Admit: 2021-10-02 | Discharge: 2021-10-02 | Disposition: A | Discharge status: Home / Self Care | Payer: Medicare Other | Source: Ambulatory Visit | Attending: Radiation Oncology | Admitting: Radiation Oncology

## 2021-10-02 DIAGNOSIS — Z510 Encounter for antineoplastic radiation therapy: Secondary | ICD-10-CM | POA: Diagnosis not present

## 2021-10-05 ENCOUNTER — Ambulatory Visit
Admission: RE | Admit: 2021-10-05 | Discharge: 2021-10-05 | Disposition: A | Discharge status: Home / Self Care | Payer: Medicare Other | Source: Ambulatory Visit | Attending: Radiation Oncology | Admitting: Radiation Oncology

## 2021-10-05 DIAGNOSIS — Z510 Encounter for antineoplastic radiation therapy: Secondary | ICD-10-CM | POA: Diagnosis not present

## 2021-10-06 ENCOUNTER — Ambulatory Visit
Admission: RE | Admit: 2021-10-06 | Discharge: 2021-10-06 | Disposition: A | Discharge status: Home / Self Care | Payer: Medicare Other | Source: Ambulatory Visit | Attending: Radiation Oncology | Admitting: Radiation Oncology

## 2021-10-06 DIAGNOSIS — Z510 Encounter for antineoplastic radiation therapy: Secondary | ICD-10-CM | POA: Diagnosis not present

## 2021-10-07 ENCOUNTER — Ambulatory Visit
Admission: RE | Admit: 2021-10-07 | Discharge: 2021-10-07 | Disposition: A | Discharge status: Home / Self Care | Payer: Medicare Other | Source: Ambulatory Visit | Attending: Radiation Oncology | Admitting: Radiation Oncology

## 2021-10-07 DIAGNOSIS — Z510 Encounter for antineoplastic radiation therapy: Secondary | ICD-10-CM | POA: Diagnosis not present

## 2021-10-08 ENCOUNTER — Ambulatory Visit
Admission: RE | Admit: 2021-10-08 | Discharge: 2021-10-08 | Disposition: A | Discharge status: Home / Self Care | Payer: Medicare Other | Source: Ambulatory Visit | Attending: Radiation Oncology | Admitting: Radiation Oncology

## 2021-10-08 DIAGNOSIS — Z510 Encounter for antineoplastic radiation therapy: Secondary | ICD-10-CM | POA: Diagnosis not present

## 2021-10-14 ENCOUNTER — Inpatient Hospital Stay (HOSPITAL_BASED_OUTPATIENT_CLINIC_OR_DEPARTMENT_OTHER): Payer: Medicare Other | Admitting: Oncology

## 2021-10-14 ENCOUNTER — Inpatient Hospital Stay: Payer: Medicare Other

## 2021-10-14 ENCOUNTER — Encounter: Payer: Self-pay | Admitting: Oncology

## 2021-10-14 ENCOUNTER — Inpatient Hospital Stay: Payer: Medicare Other | Attending: Oncology

## 2021-10-14 ENCOUNTER — Other Ambulatory Visit: Payer: Self-pay

## 2021-10-14 VITALS — HR 97

## 2021-10-14 VITALS — BP 133/82 | HR 106 | Temp 97.8°F | Resp 18 | Wt 105.0 lb

## 2021-10-14 DIAGNOSIS — C50412 Malignant neoplasm of upper-outer quadrant of left female breast: Secondary | ICD-10-CM | POA: Insufficient documentation

## 2021-10-14 DIAGNOSIS — C50919 Malignant neoplasm of unspecified site of unspecified female breast: Secondary | ICD-10-CM | POA: Diagnosis not present

## 2021-10-14 DIAGNOSIS — Z5112 Encounter for antineoplastic immunotherapy: Secondary | ICD-10-CM | POA: Diagnosis not present

## 2021-10-14 DIAGNOSIS — Z170 Estrogen receptor positive status [ER+]: Secondary | ICD-10-CM | POA: Insufficient documentation

## 2021-10-14 DIAGNOSIS — R000 Tachycardia, unspecified: Secondary | ICD-10-CM | POA: Diagnosis not present

## 2021-10-14 DIAGNOSIS — Z5111 Encounter for antineoplastic chemotherapy: Secondary | ICD-10-CM

## 2021-10-14 DIAGNOSIS — E876 Hypokalemia: Secondary | ICD-10-CM | POA: Diagnosis not present

## 2021-10-14 DIAGNOSIS — Z5181 Encounter for therapeutic drug level monitoring: Secondary | ICD-10-CM

## 2021-10-14 DIAGNOSIS — Q909 Down syndrome, unspecified: Secondary | ICD-10-CM | POA: Insufficient documentation

## 2021-10-14 DIAGNOSIS — Z79899 Other long term (current) drug therapy: Secondary | ICD-10-CM

## 2021-10-14 LAB — CBC WITH DIFFERENTIAL/PLATELET
Abs Immature Granulocytes: 0.01 10*3/uL (ref 0.00–0.07)
Basophils Absolute: 0 10*3/uL (ref 0.0–0.1)
Basophils Relative: 1 %
Eosinophils Absolute: 0.2 10*3/uL (ref 0.0–0.5)
Eosinophils Relative: 4 %
HCT: 36.3 % (ref 36.0–46.0)
Hemoglobin: 11.9 g/dL — ABNORMAL LOW (ref 12.0–15.0)
Immature Granulocytes: 0 %
Lymphocytes Relative: 23 %
Lymphs Abs: 1 10*3/uL (ref 0.7–4.0)
MCH: 31 pg (ref 26.0–34.0)
MCHC: 32.8 g/dL (ref 30.0–36.0)
MCV: 94.5 fL (ref 80.0–100.0)
Monocytes Absolute: 0.4 10*3/uL (ref 0.1–1.0)
Monocytes Relative: 9 %
Neutro Abs: 2.7 10*3/uL (ref 1.7–7.7)
Neutrophils Relative %: 63 %
Platelets: 335 10*3/uL (ref 150–400)
RBC: 3.84 MIL/uL — ABNORMAL LOW (ref 3.87–5.11)
RDW: 12 % (ref 11.5–15.5)
WBC: 4.3 10*3/uL (ref 4.0–10.5)
nRBC: 0 % (ref 0.0–0.2)

## 2021-10-14 LAB — COMPREHENSIVE METABOLIC PANEL
ALT: 13 U/L (ref 0–44)
AST: 25 U/L (ref 15–41)
Albumin: 4.5 g/dL (ref 3.5–5.0)
Alkaline Phosphatase: 54 U/L (ref 38–126)
Anion gap: 11 (ref 5–15)
BUN: 10 mg/dL (ref 6–20)
CO2: 24 mmol/L (ref 22–32)
Calcium: 10 mg/dL (ref 8.9–10.3)
Chloride: 102 mmol/L (ref 98–111)
Creatinine, Ser: 0.79 mg/dL (ref 0.44–1.00)
GFR, Estimated: >60 mL/min (ref >60)
Glucose, Bld: 143 mg/dL — ABNORMAL HIGH (ref 70–99)
Potassium: 3.5 mmol/L (ref 3.5–5.1)
Sodium: 137 mmol/L (ref 135–145)
Total Bilirubin: 0.5 mg/dL (ref 0.3–1.2)
Total Protein: 7.8 g/dL (ref 6.5–8.1)

## 2021-10-14 MED ORDER — SODIUM CHLORIDE 0.9 % IV SOLN
Freq: Once | INTRAVENOUS | Status: AC
  Filled 2021-10-14: qty 250

## 2021-10-14 MED ORDER — TRASTUZUMAB-DKST CHEMO 150 MG IV SOLR
300.0000 mg | Freq: Once | INTRAVENOUS | Status: AC
  Administered 2021-10-14: 300 mg via INTRAVENOUS
  Filled 2021-10-14: qty 14.29

## 2021-10-14 MED ORDER — DIPHENHYDRAMINE HCL 25 MG PO CAPS
50.0000 mg | ORAL_CAPSULE | Freq: Once | ORAL | Status: AC
  Administered 2021-10-14: 50 mg via ORAL
  Filled 2021-10-14: qty 2

## 2021-10-14 MED ORDER — OMEPRAZOLE 20 MG PO CPDR: 20.0000 mg | DELAYED_RELEASE_CAPSULE | Freq: Every day | ORAL | 0 refills | Status: DC

## 2021-10-14 MED ORDER — ACETAMINOPHEN 325 MG PO TABS
650.0000 mg | ORAL_TABLET | Freq: Once | ORAL | Status: AC
  Administered 2021-10-14: 650 mg via ORAL
  Filled 2021-10-14: qty 2

## 2021-10-14 NOTE — Progress Notes (Signed)
Hematology/Oncology follow up  note [phone] [phone]   Patient Care Team: Kirk Ruths, MD as PCP - General (Internal Medicine) Earlie Server, MD as Consulting Physician (Oncology)  REFERRING PROVIDER: Kirk Ruths, MD  CHIEF COMPLAINTS/REASON FOR VISIT:  Follow up for treatment of breast cancer  HISTORY OF PRESENTING ILLNESS:   Anna Banks is a  46 y.o.  female with PMH listed below was seen in consultation at the request of  Kirk Ruths, MD  for evaluation of breast cancer.   Patient was accompanied by her half sister today. Per sister, patient has history of seizure since childhood and has intellectual/cognitive impairment. She is illiterate. She follows instructions from her family members.  She lives with her mother and her half sister who takes of her and helps her to make medical decision.   They waived interpretor. Both patient and her half sister speak [place].  Menarche 16 years of age. G0P0. No child, no previous pregnancy. Per her sister, patient is not sexually active No prior use of birth control pills or hormone replacement therapy. Denies previous history of biopsy.  02/16/2021, screening mammogram showed calcification in the right breast needs further evaluation.  Left breast possible mass with distortion requires further evaluation. 02/26/2021, left breast showed a 10 mm indeterminate mass, 2:00, 8 cm from nipple.  No mammographic evidence of malignancy in the right breast.  No left axillary lymphadenopathy. 03/10/2021, patient underwent ultrasound-guided biopsy of left breast mass.  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  ER low positive [1-10%], PR positive [1-10%], HER2 positive by IHC 3+  # 04/09/2021, status postlumpectomy and sentinel lymph node biopsy. Invasive mammary carcinoma, no special type, 4 lymph nodes all negative for  metastatic carcinoma.  DCIS present.  LVI not identified.Grade 2, all margins negative for invasive carcinoma. pT1c pN0, ER[staining was repeated on surgical specimen] [11-50%]+ PR [1-10%]+, HER 2+  Baseline echocardiogram showed normal LVEF.   07/28/2021, echocardiogram showed LVEF is normal 60 to 65%.  INTERVAL HISTORY Anna Banks is a 46 y.o. female who has above history reviewed by me today presents for follow up visit for chemotherapy for HER2 positive breast cancer  Patient is here by herself today. [place] interpreter service was used. Patient reports occasional nausea/vomiting, heartburn.  No fever, chills, diarrhea.  Today she has no nausea or vomiting. She has finished adjuvant radiation of the breast.  Denies any problem with her skin.  Review of Systems  Unable to perform ROS: Other (intellectual/Cognitive impairment)  Constitutional:  Negative for appetite change, chills, fatigue and fever.  HENT:   Negative for hearing loss and voice change.   Eyes:  Negative for eye problems.  Respiratory:  Negative for chest tightness and cough.   Cardiovascular:  Negative for chest pain.  Gastrointestinal:  Positive for nausea and vomiting. Negative for abdominal distention, abdominal pain and blood in stool.  Endocrine: Negative for hot flashes.  Genitourinary:  Negative for difficulty urinating and frequency.   Musculoskeletal:  Negative for arthralgias.  Skin:  Negative for itching and rash.  Neurological:  Negative for extremity weakness.  Hematological:  Negative for adenopathy.  Psychiatric/Behavioral:  Negative for confusion.    MEDICAL HISTORY:  Past Medical History:  Diagnosis Date   Down syndrome    Seizure [facility])     SURGICAL HISTORY: Past Surgical History:  Procedure Laterality Date   BREAST BIOPSY Left 03/10/2021  [place] BX,ribbon clip, [place] LYMPH NODE BIOPSY Left 04/09/2021   Procedure: BREAST  LUMPECTOMY,RADIO FREQ LOCALIZER,AXILLARY SENTINEL LYMPH NODE BIOPSY;  Surgeon: Benjamine Sprague, DO;  Location: [facility] ORS;  Service: General;  Laterality: Left;    SOCIAL HISTORY: Social History   Socioeconomic History   Marital status: Single    Spouse name: Not on file   Number of children: Not on file   Years of education: Not on file   Highest education level: Not on file  Occupational History   Not on file  Tobacco Use   Smoking status: Never   Smokeless tobacco: Never  Vaping Use   Vaping Use: Never used  Substance and Sexual Activity   Alcohol use: Never   Drug use: Not Currently   Sexual activity: Not on file  Other Topics Concern   Not on file  Social History Narrative   Not on file   Social Determinants of Health   Financial Resource Strain: Not on file  Food Insecurity: Not on file  Transportation Needs: Not on file  Physical Activity: Not on file  Stress: Not on file  Social Connections: Not on file  Intimate Partner Violence: Not on file    FAMILY HISTORY: History reviewed. No pertinent family history.  ALLERGIES:  has No Known Allergies.  MEDICATIONS:  Current Outpatient Medications  Medication Sig Dispense Refill   acetaminophen (TYLENOL) 325 MG tablet Take 325-650 mg by mouth every 6 (six) hours as needed for moderate pain.     ethosuximide (ZARONTIN) 250 MG capsule Take 250 mg by mouth 2 (two) times daily.     HYDROcodone-acetaminophen (NORCO) 5-325 MG tablet Take 1 tablet by mouth every 6 (six) hours as needed for up to 6 doses for moderate pain. 6 tablet 0   KLOR-CON M20 20 MEQ tablet TAKE 1 TABLET BY MOUTH EVERY DAY 90 tablet 1   omeprazole (PRILOSEC) 20 MG capsule Take 1 capsule (20 mg total) by mouth daily. 30 capsule 0   ondansetron (ZOFRAN) 8 MG tablet Take 1 tablet (8 mg total) by mouth 2 (two) times daily as needed (Nausea or vomiting). 30 tablet 1   VITAMIN D PO Take 1 capsule by mouth daily.     No current facility-administered medications  for this visit.     PHYSICAL EXAMINATION: ECOG PERFORMANCE STATUS: 0 - Asymptomatic Vitals:   10/14/21 0842  BP: 133/82  Pulse: (!) 106  Resp: 18  Temp: 97.8 F (36.6 C)  SpO2: 100%   Filed Weights   10/14/21 0842  Weight: 105 lb (47.6 kg)    Physical Exam Constitutional:      General: She is not in acute distress. HENT:     Head: Normocephalic and atraumatic.  Eyes:     General: No scleral icterus. Cardiovascular:     Rate and Rhythm: Regular rhythm.     Heart sounds: Normal heart sounds.  Pulmonary:     Effort: Pulmonary effort is normal. No respiratory distress.     Breath sounds: No wheezing.  Abdominal:     General: Bowel sounds are normal. There is no distension.     Palpations: Abdomen is soft.  Musculoskeletal:        General: No deformity. Normal range of motion.     Cervical back: Normal range of motion and neck supple.  Skin:    General: Skin is warm and dry.     Findings: No erythema or rash.  Neurological:  Mental Status: She is alert. Mental status is at baseline.     Cranial Nerves: No cranial nerve deficit.     Coordination: Coordination normal.  Psychiatric:        Mood and Affect: Mood normal.   .   LABORATORY DATA:  I have reviewed the data as listed Lab Results  Component Value Date   WBC 4.3 10/14/2021   HGB 11.9 (L) 10/14/2021   HCT 36.3 10/14/2021   MCV 94.5 10/14/2021   PLT 335 10/14/2021   Recent Labs    09/02/21 0849 09/23/21 0820 10/14/21 0813  NA 138 133* 137  K 3.4* 3.8 3.5  CL 104 102 102  CO2 _0 GLUCOSE 102* 138* 143*  BUN _1 CREATININE 0.67 0.64 0.79  CALCIUM 9.9 9.3 10.0  GFRNONAA >60 >60 >60  PROT 7.4 7.8 7.8  ALBUMIN 4.3 4.3 4.5  AST _2 ALT _3 ALKPHOS 50 53 54  BILITOT 0.6 0.4 0.5    Iron/TIBC/Ferritin/ %Sat No results found for: IRON, TIBC, FERRITIN, IRONPCTSAT    RADIOGRAPHIC STUDIES: I have personally reviewed the radiological images as listed and agreed with  the findings in the report. No results found.    ASSESSMENT & PLAN:  1. Invasive carcinoma of breast ([place])   2. Encounter for antineoplastic chemotherapy   3. Hypokalemia   4. Tachycardia   5. Encounter for monitoring cardiotoxic drug therapy    Cancer Staging  Invasive carcinoma of breast ([place]) Staging form: Breast, AJCC 8th Edition - Pathologic stage from 04/22/2021: Stage IA (pT1c, pN0, cM0, G2, ER+, PR+, HER2+) - Signed by Earlie Server, MD on 04/22/2021    Cancer Staging  Invasive carcinoma of breast [facility]) Staging form: Breast, AJCC 8th Edition - Pathologic stage from 04/22/2021: Stage IA (pT1c, pN0, cM0, G2, ER+, PR+, HER2+) - Signed by Earlie Server, MD on 04/22/2021  #Left invasive carcinoma of breast,pT1c pN0 cM0 status postlumpectomy and sentinel lymph node biopsy. HER2 positive, ER 11-50% positive,  PR 1-10% positive.   Patient has completed adjuvant chemotherapy with Taxol weekly x12. Labs reviewed and discussed with patient.  Proceed with trastuzumab. Repeat echo prior to next visit.  #Tachycardia, patient will receive normal saline 1 L over an hour for hydration.  Repeat echo #Heartburn, occasional nausea and vomiting.  Recommend patient to try omeprazole 20 mg daily for short-term.  Prescription of 1 month supply was sent to pharmacy.  Hypokalemia, potassium has improved.  Continue potassium chloride.20 meq daily.    We spent sufficient time to discuss many aspect of care, questions were answered to patient's satisfaction.   All questions were answered. The patient knows to call the clinic with any problems questions or concerns.  cc Kirk Ruths, MD   Follow-up in  3 weeks lab MD trastuzumab maintenance.  Earlie Server, MD, PhD. 10/14/2021

## 2021-10-14 NOTE — Patient Instructions (Signed)
[facility] CANCER CTR AT [place]  Discharge Instructions: Thank you for choosing [facility] to provide your oncology and hematology care.  If you have a lab appointment with the [facility], please go directly to the [facility] and check in at the registration area.  Wear comfortable clothing and clothing appropriate for easy access to any Portacath or PICC line.   We strive to give you quality time with your provider. You may need to reschedule your appointment if you arrive late (15 or more minutes).  Arriving late affects you and other patients whose appointments are after yours.  Also, if you miss three or more appointments without notifying the office, you may be dismissed from the clinic at the providers discretion.      For prescription refill requests, have your pharmacy contact our office and allow 72 hours for refills to be completed.    Today you received the following chemotherapy and/or immunotherapy agents OGIVIRI and IVF      To help prevent nausea and vomiting after your treatment, we encourage you to take your nausea medication as directed.  BELOW ARE SYMPTOMS THAT SHOULD BE REPORTED IMMEDIATELY: *FEVER GREATER THAN 100.4 F (38 C) OR HIGHER *CHILLS OR SWEATING *NAUSEA AND VOMITING THAT IS NOT CONTROLLED WITH YOUR NAUSEA MEDICATION *UNUSUAL SHORTNESS OF BREATH *UNUSUAL BRUISING OR BLEEDING *URINARY PROBLEMS (pain or burning when urinating, or frequent urination) *BOWEL PROBLEMS (unusual diarrhea, constipation, pain near the anus) TENDERNESS IN MOUTH AND THROAT WITH OR WITHOUT PRESENCE OF ULCERS (sore throat, sores in mouth, or a toothache) UNUSUAL RASH, SWELLING OR PAIN  UNUSUAL VAGINAL DISCHARGE OR ITCHING   Items with * indicate a potential emergency and should be followed up as soon as possible or go to the Emergency Department if any problems should occur.  Please show the CHEMOTHERAPY ALERT CARD or IMMUNOTHERAPY ALERT CARD at  check-in to the Emergency Department and triage nurse.  Should you have questions after your visit or need to cancel or reschedule your appointment, please contact [facility] CANCER [place] AT [place]  [phone] and follow the prompts.  Office hours are 8:00 a.m. to 4:30 p.m. Monday - Friday. Please note that voicemails left after 4:00 p.m. may not be returned until the following business day.  We are closed weekends and major holidays. You have access to a nurse at all times for urgent questions. Please call the main number to the clinic [phone] and follow the prompts.  For any non-urgent questions, you may also contact your provider using MyChart. We now offer e-Visits for anyone 5 and older to request care online for non-urgent symptoms. For details visit [email or web address].   Also download the MyChart app! Go to the app store, search "MyChart", open the app, select [facility], and log in with your MyChart username and password.  Due to Covid, a mask is required upon entering the hospital/clinic. If you do not have a mask, one will be given to you upon arrival. For doctor visits, patients may have 1 support person aged 51 or older with them. For treatment visits, patients cannot have anyone with them due to current Covid guidelines and our immunocompromised population.   Trastuzumab injection for infusion What is this medication? TRASTUZUMAB (tras TOO zoo mab) is a monoclonal antibody. It is used to treat breast cancer and stomach cancer. This medicine may be used for other purposes; ask your health care provider or pharmacist if you have questions. COMMON BRAND NAME(S): Herceptin, Belenda Cruise, Ogivri,  Chalmers Guest What should I tell my care team before I take this medication? They need to know if you have any of these conditions: heart disease heart failure lung or breathing disease, like asthma an unusual or allergic reaction to trastuzumab, benzyl  alcohol, or other medications, foods, dyes, or preservatives pregnant or trying to get pregnant breast-feeding How should I use this medication? This drug is given as an infusion into a vein. It is administered in a hospital or clinic by a specially trained health care professional. Talk to your pediatrician regarding the use of this medicine in children. This medicine is not approved for use in children. Overdosage: If you think you have taken too much of this medicine contact a poison control center or emergency room at once. NOTE: This medicine is only for you. Do not share this medicine with others. What if I miss a dose? It is important not to miss a dose. Call your doctor or health care professional if you are unable to keep an appointment. What may interact with this medication? This medicine may interact with the following medications: certain types of chemotherapy, such as daunorubicin, doxorubicin, epirubicin, and idarubicin This list may not describe all possible interactions. Give your health care provider a list of all the medicines, herbs, non-prescription drugs, or dietary supplements you use. Also tell them if you smoke, drink alcohol, or use illegal drugs. Some items may interact with your medicine. What should I watch for while using this medication? Visit your doctor for checks on your progress. Report any side effects. Continue your course of treatment even though you feel ill unless your doctor tells you to stop. Call your doctor or health care professional for advice if you get a fever, chills or sore throat, or other symptoms of a cold or flu. Do not treat yourself. Try to avoid being around people who are sick. You may experience fever, chills and shaking during your first infusion. These effects are usually mild and can be treated with other medicines. Report any side effects during the infusion to your health care professional. Fever and chills usually do not happen with  later infusions. Do not become pregnant while taking this medicine or for 7 months after stopping it. Women should inform their doctor if they wish to become pregnant or think they might be pregnant. Women of child-bearing potential will need to have a negative pregnancy test before starting this medicine. There is a potential for serious side effects to an unborn child. Talk to your health care professional or pharmacist for more information. Do not breast-feed an infant while taking this medicine or for 7 months after stopping it. Women must use effective birth control with this medicine. What side effects may I notice from receiving this medication? Side effects that you should report to your doctor or health care professional as soon as possible: allergic reactions like skin rash, itching or hives, swelling of the face, lips, or tongue chest pain or palpitations cough dizziness feeling faint or lightheaded, falls fever general ill feeling or flu-like symptoms signs of worsening heart failure like breathing problems; swelling in your legs and feet unusually weak or tired Side effects that usually do not require medical attention (report to your doctor or health care professional if they continue or are bothersome): bone pain changes in taste diarrhea joint pain nausea/vomiting weight loss This list may not describe all possible side effects. Call your doctor for medical advice about side effects. You may report side  effects to FDA at 1-800-FDA-1088. Where should I keep my medication? This drug is given in a hospital or clinic and will not be stored at home. NOTE: This sheet is a summary. It may not cover all possible information. If you have questions about this medicine, talk to your doctor, pharmacist, or health care provider.  2022 Elsevier/Gold Standard (2016-09-14 00:00:00)  M?t n??c, Ng??i l?n Dehydration, Adult M?t n??c l ti?nh tra?ng khng c ?? n??c ho?c d?ch khc trong c?  th?. Tnh tr?ng ny x?y ra khi m?t ng??i m?t nhi?u n??c h?n l??ng n??c ng??i ? u?ng vo. Ca?c c? Georges Lynch tr?ng nh? th?n, no v tim khng th? th?c hi?n ch?c n?ng m khng c m?t l???ng d?ch phu? h??p. B?t c? lo?i d?ch no b? m?t kho?i c? th? ??u c th? d?n ??n m?t n??c. M?t n??c c th? l nh?, v?a ph?i, ho?c n?ng. Tnh tr?ng ny c?n ???c ?i?u tr? ngay l?p t?c ?? ng?n khng cho m?t n??c tr? nn n?ng h?n. Nguyn nhn g gy ra? M?t n??c c th? l do: Nh?ng tnh tr?ng gy m?t n??c ho?c d?ch khc, ch?ng h?n nh? tiu ch?y, nn m?a, ho?c ?? m? hi ho?c ?i ti?u nhi?u. Khng u?ng ?? n??c, ??c bi?t l khi qu v? b? ?m ho?c th?c hi?n cc ho?t ??ng c?n nhi?u n?ng l??ng. Cc b?nh v tnh tr?ng khc, ch?ng h?n nh? s?t ho?c nhi?m trng. M?t s? lo?i thu?c nh?t ??nh, ch?ng h?n nh? cc lo?i thu?c khi?n c? th? m?t n??c qu m?c (thu?c l?i ti?u). Thi?u n??c u?ng an ton. Khng th? c ?? n??c v th?c ph?m. ?i?u g lm t?ng nguy c?? Nh?ng y?u t? sau c th? khi?n cho qu v? d? b? tnh tr?ng ny h?n: B? b?nh ko di (m?n tnh) ch?a ???c ?i?u tr? ?ng cch, ch?ng h?n nh? ti?u ???ng, b?nh tim, ho?c b?nh th?n. T? 67 tu?i tr? ln. B? tn t?t. S?ng ? m?t n?i c ?? cao l?n, ? ? khng kh long, kh h?n gy m?t n??c nhi?u h?n. Th?c hi?n cc bi t?p b?t c? th? ph?i g?ng s?c trong th?i gian di (cc mn th? thao v? s?c b?n). C cc d?u hi?u ho?c tri?u ch?ng g? Cc tri?u ch?ng m?t n??c ph? thu?c vo m?c ?? n?ng c?a m?t n??c. M?t n??c nh? ho?c v?a ph?i Kha?t. Mi ho??c mi?ng kh. Chng m?t ho?c chong vng, ??c bi?t l khi ?ang ng?i th ??ng ln. Co th?t c?. N??c ti?u ??m mu. N??c ti?u c th? c mu tr. L??ng n??c ti?u ho?c n??c m?t t h?n bnh th??ng. ?au ??u. M?t n??c n?ng Nh?ng thay ??i ?? da. Da qu v? c th? l?nh v ?m, c v?t loang l?, ho?c nh?t nh?t. Da Sander Nephew v? c?ng c th? khng tr? v? bnh th??ng sau khi b? vo nh? v th? ra. C t ho?c khng c n??c m?t, n??c ti?u ho?c m? hi. Cc thay ??i v? cc d?u hi?u  sinh t?n, ch?ng h?n nh? th? nhanh v huy?t p th?p. M?ch c?a qu v? c th? y?u ho?c c th? nhanh h?n 100 nh?p/pht khi qu v? ng?i yn. Nh?ng thay ??i khc, ch?ng h?n nh?: C?m th?y r?t kht. M?t tr?ng. Bn tay v bn chn l?nh. L l?n. R?t m?t m?i (ng? l?m) ho?c kh th?c gi?c. S?t cn trong th?i gian ng??n. M?t  th?c. Ch?n ?on tnh tr?ng ny nh? th? no? Tnh tr?ng ny ???c ch?n ?on d?a vo cc tri?u ch?ng c?a qu v? v  khm th?c th?Sander Nephew v? c th? ???c xt nghi?m mu v n??c ti?u ?? gip kh?ng ??nh ch?n ?on. Tnh tr?ng ny ???c ?i?u tr? nh? th? no? ?i?u tr? tnh tr?ng ny ty thu?c vo m?c ?? n?ng c?a m?t n??c. ?i?u tr? c?n ph?i ???c b?t ??u ngay l?p t?c. Khng ch? cho ??n khi m?t n??c tr? nn n?ng h?n. M?t n??c n?ng l tnh tr?ng c?p c?u v c?n ???c ?i?u tr? t?i b?nh vi?n. C th? ?i?u tr? m?t n??c nh? ho?c v?a ph?i t?i nh. Qu v? c th? ???c yu c?u: U?ng nhi?u n??c h?n. U?ng dung d?ch b n??c (ORS). Lo?i ?? u?ng ny gip khi ph?c l?i l??ng n??c, mu?i v cc khong ch?t ph h?p trong mu (cc ch?t ?i?n gi?i). M?t n??c n?ng c th? ???c ?i?u tr?: B?ng d?ch truy?n t?nh m?ch. B?ng cch ?i?u ch?nh n?ng ?? b?t th??ng c?a cc ch?t ?i?n gi?i. Vi?c ny th??ng ???c th?c hi?n b?ng cch ??a cc ch?t ?i?n gi?i qua m?t ?ng lu?n qua m?i va? va?o da? da?y cu?a quy? vi? (?ng thng m?i-d? dy hay ?ng NG). B?ng cch ?i?u tr? nguyn nhn m?t n??c bn trong. Tun th? nh?ng h??ng d?n ny ? nh: Dung d?ch b n??c d?ng u?ng U?ng ORS n?u ???c chuyn gia ch?m [place] s?c kh?e c?a qu v? ch? d?n: Pha ORS theo ch? d?n trn gi. B?t ??u u?ng v?i l??ng nh?, kho?ng  c?c (120 mL), 5-10 pht m?t l?n. T?ng t? t? l??ng n??c u?ng cho ??n khi qu v? dng ?? s? l??ng theo khuy?n ngh? c?a chuyn gia ch?m [place] s?c kh?e. ?n v u?ng     U?ng ?? ?? l?ng trong ?? gi? cho n??c ti?u c mu vng nh?t. N?u qu v? ???c ch? d?n u?ng ORS, hy u?ng h?t ORS tr??c, sau ? b?t ??u u?ng t? t? cc ?? l?ng trong khc. U?ng cc ?? l?ng  nh?: N??c. Khng u?ng ?? u?ng ch? l n??c. U?ng ?? u?ng ch? l n??c c th? d?n ??n h? natri huy?t, t?c l c qu t mu?i (natri) trong c? th?. Qu v? c th? ht n??c t? ? bo. N??c p tri cy qu v? ? b? sung thm n??c (n??c p tri cy pha long). ?? u?ng th? thao t calo. ?n cc lo?i th?c ph?m c cn b?ng cc ch?t ?i?n gi?i c l?i cho s?c kh?e, ch?ng h?n nh? chu?i, cam, khoai ty, c chua v rau bina. Khng u?ng r??u/bia. Tra?nh nh?ng th? sau: ?? u?ng ch?a nhi?u ???ng. Nh?ng ?? u?ng ny bao g?m ?? u?ng th? thao nhi?u calo, n??c p tri cy khng pha long v soda. Caffeine. Th?c ph?m nhi?u d?u m? ho?c c nhi?u ch?t bo ho?c ???ng. H??ng d?n chung Ch? s? d?ng thu?c khng k ??n v thu?c k ??n theo ch? d?n c?a chuyn gia ch?m [place] s?c kh?e. Khng dng natri d?ng vin nn. Dng nh? v?y c th? d?n ??n qu nhi?u natri trong c? th? (t?ng natri huy?t). Tr? l?i sinh ho?t bnh th??ng theo ch? d?n c?a chuyn gia ch?m [place] s?c kh?e. Hy h?i chuyn gia ch?m [place] s?c kh?e v? cc ho?t ??ng no l an ton cho qu v?. Tun th? t?t c? cc l?n khm theo di theo ch? d?n c?a chuyn gia ch?m [place] s?c kh?e. ?i?u ny c vai tr quan tr?ng. Hy lin l?c v?i chuyn gia ch?m [place] s?c kh?e n?u: Qu v? b? co th?t c?, ?au, ho?c c?m gic kh ch?u, ch?ng h?n nh?: ?  au ? b?ng v c?n ?au d? d?i h?n ho?c duy tr ? m?t khu v?c (?au c?c b?). C?ng c?. Qu v? b? pht ban. Qu v? d? b? kch ?ng h?n bnh th??ng. Qu v? bu?n ng? h?n ho?c kh th?c gi?c h?n bnh th??ng. Qu v? c?m th?y chng m?t ho?c y?u. Qu v? c?m th?y r?t kht. Yu c?u tr? gip ngay l?p t?c n?u qu v? c: B?t c? tri?u ch?ng m?t n??c n?ng no. Cc tri?u ch?ng nn m?a, ch?ng h?n nh?: Qu v? khng th? ?n ho?c u?ng v vi?c ny khi?n qu v? b? nn. Nn m?a tr?m tr?ng h?n ho?c khng h?t. Trong ch?t nn c mu ho?c ch?t mu xanh l (m?t). Cc tri?u ch?ng tr?m tr?ng h?n khi ???c ?i?u tr?Marland Kitchen S?t. ?au ??u d? d?i. Cc v?n ?? v?i ti?u ti?n ho?c ??i ti?n, ch?ng h?n  nh?: Tiu ch?y tr? nn tr?m tr?ng h?n ho?c khng h?t. Mu trong phn (phn). ?i?u ny c th? khi?n cho phn c mu ?en v gi?ng h?c n. Khng ?i ti?u ho?c ch? ?i m?t l??ng nh? n??c ti?u r?t ??m mu, trong 6-8 gi?Marland Kitchen Kh th?. Nh?ng tri?u ch?ng ny c th? l bi?u hi?n c?a m?t v?n ?? nghim tr?ng c?n c?p c?u. Khng ch? xem tri?u ch?ng c h?t khng. Hy ?i khm ngay l?p t?c. G?i cho d?ch v? c?p c?u t?i ??a ph??ng (911 ? [place]?). Khng t? li xe ??n b?nh vi?n. Tm t?t M?t n??c l ti?nh tra?ng khng c ?? n??c ho?c d?ch khc trong c? th?. Tnh tr?ng ny x?y ra khi m?t ng??i m?t nhi?u n??c h?n l??ng n??c ng??i ? u?ng vo. ?i?u tr? tnh tr?ng ny ty thu?c vo m?c ?? n?ng c?a m?t n??c. ?i?u tr? c?n ph?i ???c b?t ??u ngay l?p t?c. Khng ch? cho ??n khi m?t n??c tr? nn n?ng h?n. U?ng ?? ?? l?ng trong ?? gi? cho n??c ti?u c mu vng nh?t. N?u qu v? ???c ch? d?n u?ng dung d?ch b n??c (ORS), hy u?ng h?t ORS tr??c, sau ? b?t ??u u?ng t? t? cc ?? l?ng trong khc. Ch? s? d?ng thu?c khng k ??n v thu?c k ??n theo ch? d?n c?a chuyn gia ch?m [place] s?c kh?e. Nh? gip ?? ngay l?p t?c n?u qu v? c b?t c? tri?u ch?ng m?t n??c n?ng no. Thng tin ny khng nh?m m?c ?ch thay th? cho l?i khuyn m chuyn gia ch?m [place] s?c kh?e ni v?i qu v?. Hy b?o ??m qu v? ph?i th?o lu?n b?t k? v?n ?? g m qu v? c v?i chuyn gia ch?m [place] s?c kh?e c?a qu v?. Document Revised: 05/15/2019 Document Reviewed: 05/15/2019 Elsevier Patient Education  [place].  Trastuzumab injection for infusion ?y l thu?c g? TRASTUZUMAB l m?t khng th? ??n dng. Thu?c ???c dng ?? ?i?u tr? ung th? v v ung th? bao t?. Thu?c ny c th? ???c dng cho nh?ng m?c ?ch khc; hy h?i ng??i cung c?p d?ch v? y t? ho?c d??c s? c?a mnh, n?u qu v? c th?c m?c. (CC) NHN HI?U PH? BI?N: Herceptin, [place], KANJINTI, Ogivri, [place]ruz[place], Trazimera Ti c?n ph?i bo cho ng??i cung c?p d?ch v? y t? c?a mnh ?i?u g tr??c khi dng thu?c ny? H?  c?n bi?t li?u qu v? c b?t k? tnh tr?ng no sau ?y khng: b?nh tim suy tim b?nh ph?i ho??c h h?p, ch??ng ha?n nh? hen suy?n pha?n ??ng b?t th???ng ho??c di? ??ng v??i trastuzumab ho?c c?n benzyl pha?n ??  ng b?t th???ng ho??c di? ??ng v??i ca?c d??c ph?m kha?c, th?c ph?m, thu?c nhu?m, ho??c ch?t ba?o qua?n ?ang c thai ho??c ??nh co? thai ?ang cho con bu? Ti nn s? d?ng thu?c ny nh? th? no? Thu?c ny ?? truy?n vo t?nh m?ch. Thu?c ny ???c cho trong b?nh vi?n ho?c phng m?ch b?i chuyn vin y t? ???c hu?n luy?n ??c bi?t. Hy bn v?i bc s? nhi khoa c?a qu v? v? vi?c dng thu?c ny ? tr? em. Thu?c ny khng ???c duy?t ?? dng cho tr? em. Qu li?u: N?u qu v? cho r?ng mnh ? dng qu nhi?u thu?c ny, th hy lin l?c v?i trung tm ki?m sot ch?t ??c ho?c phng c?p c?u ngay l?p t?c. L?U : Thu?c ny ch? dnh ring cho qu v?. Khng chia s? thu?c ny v?i nh?ng ng??i khc. N?u ti l? qun m?t li?u th sao? ?i?u quan tr?ng l khng nn b? l? li?u thu?c no. Hy lin l?c v?i bc s? ho?c [place] vin y t? c?a mnh, n?u qu v? khng th? gi? ?ng cu?c h?n khm. Nh?ng g c th? t??ng tc v?i thu?c ny? Thu?c ny c th? t??ng tc v?i cc thu?c sau ?y: m?t s? lo?i thu?c ha tr? li?u, nh? l daunorubicin, doxorubicin, epirubicin v idarubicin Danh sch ny c th? khng m t? ?? h?t cc t??ng tc c th? x?y ra. Hy ??a cho ng??i cung c?p d?ch v? y t? c?a mnh danh sch t?t c? cc thu?c, th?o d??c, cc thu?c khng c?n toa, ho?c cc ch? ph?m b? sung m qu v? dng. C?ng nn bo cho h? bi?t r?ng qu v? c ht thu?c, u?ng r??u, ho?c c s? d?ng ma ty tri php hay khng. Vi th? c th? t??ng tc v?i thu?c c?a qu v?. Ti c?n ph?i theo di ?i?u g trong khi dng thu?c ny? Hy ?i g?p bc s? ho?c [place] vin y t? ?? theo di ??nh k? s? c?i thi?n c?a qu v?. Hy t??ng trnh m?i tc d?ng ph?. Hy ti?p t?c ??t ?i?u tr? c?a mnh ngay c? khi qu v? c?m th?y m?t, tr? khi bc s? yu c?u qu v? ng?ng ?i?u  tr?. Hy h?i  ki?n bc s? ho?c chuyn vin y t?, n?u qu v? b? s?t, ?n l?nh ho?c ?au h?ng, ho?c c cc tri?u ch?ng khc c?a c?m l?nh ho?c cm. Khng ???c t? ?i?u tr? cho mnh. Hy c? trnh ? g?n nh?ng ng??i b? b?nh. Qu v? c th? b? s?t, c?m th?y ?n l?nh v run trong l?n truy?n ??u tin. Nh?ng tc d?ng ph? ny th??ng nh? v c th? ???c ?i?u tr? b?ng cc thu?c khc. Hy t??ng trnh cho bc s? ho?c chuyn vin y t? m?i tc d?ng ph? trong khi truy?n. S?t v ?n l?nh th??ng khng x?y ra trong cc l?n truy?n sau. Khng ???c c thai trong th?i gian dng thu?c ny ho?c trong vng 7 thng sau khi ng?ng dng thu?c ny. Ph? n? c?n ph?i thng bo cho bc s? c?a mnh, n?u mu?n c thai ho?c ngh? r?ng c th? mnh ? c [place]. Phu? n?? co? kha? n?ng mang thai c?n pha?i co? xt nghi?m th? [place] m tnh tr??c khi b?t ??u dng thu?c na?y. C nguy c? v? cc tc d?ng ph? nghim tr?ng ??i v?i [place] nhi. Hy th?o lu?n v?i bc s? ho?c chuyn vin y t? ho?c d??c s? ?? bi?t thm thng tin. Khng ???c cho con b trong th?i gian dng thu?c  ny ho?c trong vng 7 thng sau khi ng?ng dng thu?c. Ph? n? ph?i s? d?ng bi?n php ng?a thai hi?u qu? cng v?i thu?c ny. Ti c th? nh?n th?y nh?ng tc d?ng ph? no khi dng thu?c ny? Nh?ng tc d?ng ph? qu v? c?n ph?i bo cho bc s? ho?c chuyn vin y t? cng s?m cng t?t: cc ph?n ?ng d? ?ng, ch?ng h?n nh? da b? m?n ??, ng?a, n?i my ?ay, s?ng ? m?t, mi, ho?c l??i ?au ng?c ?nh tr?ng ng?c ho chng m?t c?m th?y chong vng, ng?t x?u, b? t s?t c?m th?y m?t m?i ho?c cc tri?u ch?ng gi?ng cm cc d?u hi?u suy tim n?ng h?n, ch?ng h?n nh? cc v?n ?? v? h h?p; b? s?ng ? chn v bn chn c?a mnh m?t m?i ho?c y?u ?t b?t th??ng Cc tc d?ng ph? khng c?n ph?i ch?m [place] y t? (hy bo cho bc s? ho?c chuyn vin y t?, n?u cc tc d?ng ph? ny ti?p di?n ho?c gy phi?n toi): ?au x??ng th?y v? c?a cc mn ?n thay ??i tiu ch?y ?au ho?c nh?c kh?p bu?n i ho?c i m?a gi?m cn Danh sch ny c  th? khng m t? ?? h?t cc tc d?ng ph? c th? x?y ra. Xin g?i t?i bc s? c?a mnh ?? ???c c? v?n chuyn mn v? cc tc d?ng ph?Sander Nephew v? c th? t??ng trnh cc tc d?ng ph? cho FDA theo s? [phone]. Ti nn c?t gi? thu?c c?a mnh ? ?u? Thu?c ny ???c s? d?ng b?i chuyn vin y t? ? b?nh vi?n ho?c ? phng m?ch. Qu v? s? khng ???c c?p thu?c ny ?? c?t gi? t?i nh. L?U : ?y l b?n tm t?t. N c th? khng bao hm t?t c? thng tin c th? c. N?u qu v? th?c m?c v? thu?c ny, xin trao ??i v?i bc s?, d??c s?, ho?c ng??i cung c?p d?ch v? y t? c?a mnh.  2022 Elsevier/Gold Standard (2020-03-04 00:00:00)

## 2021-10-14 NOTE — Progress Notes (Signed)
Per Dr Tasia Catchings ok to treat with HR 106- will get one L IVF first

## 2021-10-14 NOTE — Progress Notes (Signed)
Patient here for follow up. Pt reports she has occasional stomach pain that doesn't allow her to eat or sleep well.

## 2021-11-02 ENCOUNTER — Ambulatory Visit
Admission: RE | Admit: 2021-11-02 | Discharge: 2021-11-02 | Disposition: A | Discharge status: Home / Self Care | Payer: Medicare Other | Source: Ambulatory Visit | Attending: Oncology | Admitting: Oncology

## 2021-11-02 DIAGNOSIS — Z5181 Encounter for therapeutic drug level monitoring: Secondary | ICD-10-CM | POA: Diagnosis not present

## 2021-11-02 DIAGNOSIS — Z79899 Other long term (current) drug therapy: Secondary | ICD-10-CM | POA: Insufficient documentation

## 2021-11-02 DIAGNOSIS — R000 Tachycardia, unspecified: Secondary | ICD-10-CM | POA: Insufficient documentation

## 2021-11-02 NOTE — Progress Notes (Signed)
*  PRELIMINARY RESULTS* Echocardiogram 2D Echocardiogram has been performed.  Anna Banks 11/02/2021, 9:37 AM

## 2021-11-03 LAB — ECHOCARDIOGRAM COMPLETE
AR max vel: 2.14 cm2
AV Area VTI: 2.21 cm2
AV Area mean vel: 2.05 cm2
AV Mean grad: 4 mmHg
AV Peak grad: 7.3 mmHg
Ao pk vel: 1.35 m/s
Area-P 1/2: 4.54 cm2
MV VTI: 1.92 cm2
S' Lateral: 2.7 cm

## 2021-11-04 ENCOUNTER — Inpatient Hospital Stay: Payer: Medicare Other

## 2021-11-04 ENCOUNTER — Other Ambulatory Visit: Payer: Self-pay

## 2021-11-04 ENCOUNTER — Inpatient Hospital Stay (HOSPITAL_BASED_OUTPATIENT_CLINIC_OR_DEPARTMENT_OTHER): Payer: Medicare Other | Admitting: Oncology

## 2021-11-04 ENCOUNTER — Encounter: Payer: Self-pay | Admitting: Oncology

## 2021-11-04 VITALS — BP 118/77 | HR 89 | Temp 97.7°F | Wt 107.0 lb

## 2021-11-04 DIAGNOSIS — E876 Hypokalemia: Secondary | ICD-10-CM

## 2021-11-04 DIAGNOSIS — C50919 Malignant neoplasm of unspecified site of unspecified female breast: Secondary | ICD-10-CM

## 2021-11-04 DIAGNOSIS — Z5111 Encounter for antineoplastic chemotherapy: Secondary | ICD-10-CM

## 2021-11-04 DIAGNOSIS — Z79810 Long term (current) use of selective estrogen receptor modulators (SERMs): Secondary | ICD-10-CM | POA: Diagnosis not present

## 2021-11-04 DIAGNOSIS — Z5112 Encounter for antineoplastic immunotherapy: Secondary | ICD-10-CM | POA: Diagnosis not present

## 2021-11-04 LAB — COMPREHENSIVE METABOLIC PANEL
ALT: 20 U/L (ref 0–44)
AST: 29 U/L (ref 15–41)
Albumin: 4.3 g/dL (ref 3.5–5.0)
Alkaline Phosphatase: 55 U/L (ref 38–126)
Anion gap: 9 (ref 5–15)
BUN: 19 mg/dL (ref 6–20)
CO2: 25 mmol/L (ref 22–32)
Calcium: 10.3 mg/dL (ref 8.9–10.3)
Chloride: 103 mmol/L (ref 98–111)
Creatinine, Ser: 0.66 mg/dL (ref 0.44–1.00)
GFR, Estimated: >60 mL/min (ref >60)
Glucose, Bld: 118 mg/dL — ABNORMAL HIGH (ref 70–99)
Potassium: 3.7 mmol/L (ref 3.5–5.1)
Sodium: 137 mmol/L (ref 135–145)
Total Bilirubin: 0.6 mg/dL (ref 0.3–1.2)
Total Protein: 8 g/dL (ref 6.5–8.1)

## 2021-11-04 LAB — CBC WITH DIFFERENTIAL/PLATELET
Abs Immature Granulocytes: 0.01 10*3/uL (ref 0.00–0.07)
Basophils Absolute: 0 10*3/uL (ref 0.0–0.1)
Basophils Relative: 1 %
Eosinophils Absolute: 0.2 10*3/uL (ref 0.0–0.5)
Eosinophils Relative: 5 %
HCT: 40.1 % (ref 36.0–46.0)
Hemoglobin: 13.1 g/dL (ref 12.0–15.0)
Immature Granulocytes: 0 %
Lymphocytes Relative: 26 %
Lymphs Abs: 1.4 10*3/uL (ref 0.7–4.0)
MCH: 30.7 pg (ref 26.0–34.0)
MCHC: 32.7 g/dL (ref 30.0–36.0)
MCV: 93.9 fL (ref 80.0–100.0)
Monocytes Absolute: 0.5 10*3/uL (ref 0.1–1.0)
Monocytes Relative: 9 %
Neutro Abs: 3.2 10*3/uL (ref 1.7–7.7)
Neutrophils Relative %: 59 %
Platelets: 321 10*3/uL (ref 150–400)
RBC: 4.27 MIL/uL (ref 3.87–5.11)
RDW: 12.2 % (ref 11.5–15.5)
WBC: 5.3 10*3/uL (ref 4.0–10.5)
nRBC: 0 % (ref 0.0–0.2)

## 2021-11-04 LAB — PREGNANCY, URINE: Preg Test, Ur: NEGATIVE

## 2021-11-04 MED ORDER — DIPHENHYDRAMINE HCL 25 MG PO CAPS
50.0000 mg | ORAL_CAPSULE | Freq: Once | ORAL | Status: AC
  Administered 2021-11-04: 50 mg via ORAL
  Filled 2021-11-04: qty 2

## 2021-11-04 MED ORDER — TAMOXIFEN CITRATE 20 MG PO TABS: 20.0000 mg | ORAL_TABLET | Freq: Every day | ORAL | 3 refills | Status: DC

## 2021-11-04 MED ORDER — ACETAMINOPHEN 325 MG PO TABS
650.0000 mg | ORAL_TABLET | Freq: Once | ORAL | Status: AC
  Administered 2021-11-04: 650 mg via ORAL
  Filled 2021-11-04: qty 2

## 2021-11-04 MED ORDER — TRASTUZUMAB-DKST CHEMO 150 MG IV SOLR
300.0000 mg | Freq: Once | INTRAVENOUS | Status: AC
  Administered 2021-11-04: 300 mg via INTRAVENOUS
  Filled 2021-11-04: qty 14.29

## 2021-11-04 MED ORDER — SODIUM CHLORIDE 0.9 % IV SOLN
Freq: Once | INTRAVENOUS | Status: AC
  Filled 2021-11-04: qty 250

## 2021-11-04 NOTE — Patient Instructions (Signed)
[facility] AT [place]  Discharge Instructions: Thank you for choosing [facility] to provide your oncology and hematology care.  If you have a lab appointment with the [facility], please go directly to the [facility] and check in at the registration area.  Wear comfortable clothing and clothing appropriate for easy access to any Portacath or PICC line.   We strive to give you quality time with your provider. You may need to reschedule your appointment if you arrive late (15 or more minutes).  Arriving late affects you and other patients whose appointments are after yours.  Also, if you miss three or more appointments without notifying the office, you may be dismissed from the clinic at the providers discretion.      For prescription refill requests, have your pharmacy contact our office and allow 72 hours for refills to be completed.    Today you received the following chemotherapy and/or immunotherapy agents : Herceptin     To help prevent nausea and vomiting after your treatment, we encourage you to take your nausea medication as directed.  BELOW ARE SYMPTOMS THAT SHOULD BE REPORTED IMMEDIATELY: *FEVER GREATER THAN 100.4 F (38 C) OR HIGHER *CHILLS OR SWEATING *NAUSEA AND VOMITING THAT IS NOT CONTROLLED WITH YOUR NAUSEA MEDICATION *UNUSUAL SHORTNESS OF BREATH *UNUSUAL BRUISING OR BLEEDING *URINARY PROBLEMS (pain or burning when urinating, or frequent urination) *BOWEL PROBLEMS (unusual diarrhea, constipation, pain near the anus) TENDERNESS IN MOUTH AND THROAT WITH OR WITHOUT PRESENCE OF ULCERS (sore throat, sores in mouth, or a toothache) UNUSUAL RASH, SWELLING OR PAIN  UNUSUAL VAGINAL DISCHARGE OR ITCHING   Items with * indicate a potential emergency and should be followed up as soon as possible or go to the Emergency Department if any problems should occur.  Please show the CHEMOTHERAPY ALERT CARD or IMMUNOTHERAPY ALERT CARD at check-in to  the Emergency Department and triage nurse.  Should you have questions after your visit or need to cancel or reschedule your appointment, please contact [facility] [place] AT [place]  [phone] and follow the prompts.  Office hours are 8:00 a.m. to 4:30 p.m. Monday - Friday. Please note that voicemails left after 4:00 p.m. may not be returned until the following business day.  We are closed weekends and major holidays. You have access to a nurse at all times for urgent questions. Please call the main number to the clinic [phone] and follow the prompts.  For any non-urgent questions, you may also contact your provider using MyChart. We now offer e-Visits for anyone 35 and older to request care online for non-urgent symptoms. For details visit [email or web address].   Also download the MyChart app! Go to the app store, search "MyChart", open the app, select [facility], and log in with your MyChart username and password.  Due to Covid, a mask is required upon entering the hospital/clinic. If you do not have a mask, one will be given to you upon arrival. For doctor visits, patients may have 1 support person aged 40 or older with them. For treatment visits, patients cannot have anyone with them due to current Covid guidelines and our immunocompromised population.

## 2021-11-04 NOTE — Progress Notes (Signed)
Hematology/Oncology Progress note [phone] [phone]      Patient Care Team: Anna Ruths, MD as PCP - General (Internal Medicine) Anna Server, MD as Consulting Physician (Oncology)  REFERRING PROVIDER: Kirk Ruths, MD  CHIEF COMPLAINTS/REASON FOR VISIT:  Follow up for treatment of breast cancer  HISTORY OF PRESENTING ILLNESS:   Anna Banks is a  46 y.o.  female with PMH listed below was seen in consultation at the request of  Anna Ruths, MD  for evaluation of breast cancer.   Patient was accompanied by her half sister today. Per sister, patient has history of seizure since childhood and has intellectual/cognitive impairment. She is illiterate. She follows instructions from her family members.  She lives with her mother and her half sister who takes of her and helps her to make medical decision.   They waived interpretor. Both patient and her half sister speak [place].  Menarche 48 years of age. G0P0. No child, no previous pregnancy. Per her sister, patient is not sexually active No prior use of birth control pills or hormone replacement therapy. Denies previous history of biopsy.  02/16/2021, screening mammogram showed calcification in the right breast needs further evaluation.  Left breast possible mass with distortion requires further evaluation. 02/26/2021, left breast showed a 10 mm indeterminate mass, 2:00, 8 cm from nipple.  No mammographic evidence of malignancy in the right breast.  No left axillary lymphadenopathy. 03/10/2021, patient underwent ultrasound-guided biopsy of left breast mass.  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  ER low positive [1-10%], PR positive [1-10%], HER2 positive by IHC 3+  # 04/09/2021, status postlumpectomy and sentinel lymph node biopsy. Invasive mammary carcinoma, no special type, 4 lymph nodes all negative for  metastatic carcinoma.  DCIS present.  LVI not identified.Grade 2, all margins negative for invasive carcinoma.pT1c pN0, ER [staining was repeated on surgical specimen] [11-50%]+ PR [1-10%]+, HER 2+  Baseline echocardiogram showed normal LVEF.   07/28/2021, echocardiogram showed LVEF is normal 60 to 65%.  INTERVAL HISTORY Anna Banks is a 46 y.o. female who has above history reviewed by me today presents for follow up visit for chemotherapy for HER2 positive breast cancer  Patient is here by herself today. [place] interpreter service was used. Denies nausea vomiting, diarrhea. No SOB, leg swelling.   10/01/2021 She has finished adjuvant radiation of the breast.   Review of Systems  Unable to perform ROS: Other (intellectual/Cognitive impairment)  Constitutional:  Negative for appetite change, chills, fatigue and fever.  HENT:   Negative for hearing loss and voice change.   Eyes:  Negative for eye problems.  Respiratory:  Negative for chest tightness and cough.   Cardiovascular:  Negative for chest pain.  Gastrointestinal:  Positive for nausea and vomiting. Negative for abdominal distention, abdominal pain and blood in stool.  Endocrine: Negative for hot flashes.  Genitourinary:  Negative for difficulty urinating and frequency.   Musculoskeletal:  Negative for arthralgias.  Skin:  Negative for itching and rash.  Neurological:  Negative for extremity weakness.  Hematological:  Negative for adenopathy.  Psychiatric/Behavioral:  Negative for confusion.    MEDICAL HISTORY:  Past Medical History:  Diagnosis Date   Down syndrome    Seizure [facility])     SURGICAL HISTORY: Past Surgical History:  Procedure Laterality Date   BREAST BIOPSY Left 03/10/2021   [place] BX,ribbon clip, IMC   BREAST LUMPECTOMY,RADIO FREQ LOCALIZER,AXILLARY SENTINEL LYMPH  NODE BIOPSY Left 04/09/2021   Procedure: BREAST LUMPECTOMY,RADIO FREQ LOCALIZER,AXILLARY SENTINEL LYMPH NODE BIOPSY;  Surgeon: Benjamine Sprague, DO;   Location: [facility] ORS;  Service: General;  Laterality: Left;    SOCIAL HISTORY: Social History   Socioeconomic History   Marital status: Single    Spouse name: Not on file   Number of children: Not on file   Years of education: Not on file   Highest education level: Not on file  Occupational History   Not on file  Tobacco Use   Smoking status: Never   Smokeless tobacco: Never  Vaping Use   Vaping Use: Never used  Substance and Sexual Activity   Alcohol use: Never   Drug use: Not Currently   Sexual activity: Not on file  Other Topics Concern   Not on file  Social History Narrative   Not on file   Social Determinants of Health   Financial Resource Strain: Not on file  Food Insecurity: Not on file  Transportation Needs: Not on file  Physical Activity: Not on file  Stress: Not on file  Social Connections: Not on file  Intimate Partner Violence: Not on file    FAMILY HISTORY: History reviewed. No pertinent family history.  ALLERGIES:  has No Known Allergies.  MEDICATIONS:  Current Outpatient Medications  Medication Sig Dispense Refill   acetaminophen (TYLENOL) 325 MG tablet Take 325-650 mg by mouth every 6 (six) hours as needed for moderate pain.     ethosuximide (ZARONTIN) 250 MG capsule Take 250 mg by mouth 2 (two) times daily.     HYDROcodone-acetaminophen (NORCO) 5-325 MG tablet Take 1 tablet by mouth every 6 (six) hours as needed for up to 6 doses for moderate pain. 6 tablet 0   KLOR-CON M20 20 MEQ tablet TAKE 1 TABLET BY MOUTH EVERY DAY 90 tablet 1   omeprazole (PRILOSEC) 20 MG capsule Take 1 capsule (20 mg total) by mouth daily. 30 capsule 0   ondansetron (ZOFRAN) 8 MG tablet Take 1 tablet (8 mg total) by mouth 2 (two) times daily as needed (Nausea or vomiting). 30 tablet 1   tamoxifen (NOLVADEX) 20 MG tablet Take 1 tablet (20 mg total) by mouth daily. 30 tablet 3   VITAMIN D PO Take 1 capsule by mouth daily.     No current facility-administered medications for  this visit.     PHYSICAL EXAMINATION: ECOG PERFORMANCE STATUS: 0 - Asymptomatic Vitals:   11/04/21 0832  BP: 118/77  Pulse: 89  Temp: 97.7 F (36.5 C)   Filed Weights   11/04/21 0832  Weight: 107 lb (48.5 kg)    Physical Exam Constitutional:      General: She is not in acute distress. HENT:     Head: Normocephalic and atraumatic.  Eyes:     General: No scleral icterus. Cardiovascular:     Rate and Rhythm: Regular rhythm.     Heart sounds: Normal heart sounds.  Pulmonary:     Effort: Pulmonary effort is normal. No respiratory distress.     Breath sounds: No wheezing.  Abdominal:     General: Bowel sounds are normal. There is no distension.     Palpations: Abdomen is soft.  Musculoskeletal:        General: No deformity. Normal range of motion.     Cervical back: Normal range of motion and neck supple.  Skin:    General: Skin is warm and dry.     Findings: No erythema or rash.  Neurological:  Mental Status: She is alert. Mental status is at baseline.     Cranial Nerves: No cranial nerve deficit.     Coordination: Coordination normal.  Psychiatric:        Mood and Affect: Mood normal.   .   LABORATORY DATA:  I have reviewed the data as listed Lab Results  Component Value Date   WBC 5.3 11/04/2021   HGB 13.1 11/04/2021   HCT 40.1 11/04/2021   MCV 93.9 11/04/2021   PLT 321 11/04/2021   Recent Labs    09/02/21 0849 09/23/21 0820 10/14/21 0813  NA 138 133* 137  K 3.4* 3.8 3.5  CL 104 102 102  CO2 22 24 24   GLUCOSE 102* 138* 143*  BUN 14 12 10   CREATININE 0.67 0.64 0.79  CALCIUM 9.9 9.3 10.0  GFRNONAA >60 >60 >60  PROT 7.4 7.8 7.8  ALBUMIN 4.3 4.3 4.5  AST 26 26 25   ALT 16 15 13   ALKPHOS 50 53 54  BILITOT 0.6 0.4 0.5    Iron/TIBC/Ferritin/ %Sat No results found for: IRON, TIBC, FERRITIN, IRONPCTSAT    RADIOGRAPHIC STUDIES: I have personally reviewed the radiological images as listed and agreed with the findings in the  report. ECHOCARDIOGRAM COMPLETE  Result Date: 11/03/2021    ECHOCARDIOGRAM REPORT   Patient Name:   Araceli Bouche Dewilde Date of Exam: 11/02/2021 Medical Rec #:  [id number]    Height:       61.0 in Accession #:    [id number]   Weight:       105.0 lb Date of Birth:  01/13/1976    BSA:          1.436 m Patient Age:    30 years     BP:           133/82 mmHg Patient Gender: F            HR:           97 bpm. Exam Location:  [facility] Procedure: 2D Echo, Cardiac Doppler and Color Doppler Indications:     R00.0 Tachycardia                  Chemo Z09  History:         Patient has prior history of Echocardiogram examinations, most                  recent 07/28/2021. Down syndrome, Seizure.  Sonographer:     Sherrie Sport Referring Phys:  [id number] Tranice Laduke Diagnosing Phys: Yolonda Kida MD  Sonographer Comments: Technically challenging study due to limited acoustic windows, suboptimal apical window and no subcostal window. IMPRESSIONS  1. Left ventricular ejection fraction, by estimation, is 60 to 65%. The left ventricle has normal function. The left ventricle has no regional wall motion abnormalities. Left ventricular diastolic parameters were normal.  2. Right ventricular systolic function is normal. The right ventricular size is normal.  3. The mitral valve is grossly normal. Trivial mitral valve regurgitation.  4. The aortic valve is normal in structure. Aortic valve regurgitation is not visualized. FINDINGS  Left Ventricle: Left ventricular ejection fraction, by estimation, is 60 to 65%. The left ventricle has normal function. The left ventricle has no regional wall motion abnormalities. The left ventricular internal cavity size was normal in size. There is  no left ventricular hypertrophy. Left ventricular diastolic parameters were normal. Right Ventricle: The right ventricular size is normal. No increase in right ventricular wall thickness. Right ventricular  systolic function is normal. Left Atrium: Left atrial size was normal in  size. Right Atrium: Right atrial size was normal in size. Pericardium: There is no evidence of pericardial effusion. Mitral Valve: The mitral valve is grossly normal. Trivial mitral valve regurgitation. MV peak gradient, 4.4 mmHg. The mean mitral valve gradient is 2.0 mmHg. Tricuspid Valve: The tricuspid valve is normal in structure. Tricuspid valve regurgitation is trivial. Aortic Valve: The aortic valve is normal in structure. Aortic valve regurgitation is not visualized. Aortic valve mean gradient measures 4.0 mmHg. Aortic valve peak gradient measures 7.3 mmHg. Aortic valve area, by VTI measures 2.21 cm. Pulmonic Valve: The pulmonic valve was normal in structure. Pulmonic valve regurgitation is not visualized. Aorta: The ascending aorta was not well visualized. IAS/Shunts: No atrial level shunt detected by color flow Doppler.  LEFT VENTRICLE PLAX 2D LVIDd:         4.10 cm   Diastology LVIDs:         2.70 cm   LV e' medial:    8.16 cm/s LV PW:         1.00 cm   LV E/e' medial:  12.9 LV IVS:        0.65 cm   LV e' lateral:   12.00 cm/s LVOT diam:     2.00 cm   LV E/e' lateral: 8.8 LV SV:         51 LV SV Index:   35 LVOT Area:     3.14 cm  RIGHT VENTRICLE RV Basal diam:  3.40 cm RV S prime:     14.40 cm/s TAPSE (M-mode): 2.4 cm LEFT ATRIUM           Index        RIGHT ATRIUM           Index LA diam:      2.50 cm 1.74 cm/m   RA Area:     12.70 cm LA Vol (A2C): 14.7 ml 10.23 ml/m  RA Volume:   32.80 ml  22.84 ml/m LA Vol (A4C): 15.1 ml 10.51 ml/m  AORTIC VALVE AV Area (Vmax):    2.14 cm AV Area (Vmean):   2.05 cm AV Area (VTI):     2.21 cm AV Vmax:           135.00 cm/s AV Vmean:          90.900 cm/s AV VTI:            0.229 m AV Peak Grad:      7.3 mmHg AV Mean Grad:      4.0 mmHg LVOT Vmax:         92.00 cm/s LVOT Vmean:        59.300 cm/s LVOT VTI:          0.161 m LVOT/AV VTI ratio: 0.70  AORTA Ao Root diam: 3.23 cm MITRAL VALVE                TRICUSPID VALVE MV Area (PHT): 4.54 cm     TR Peak grad:    13.7 mmHg MV Area VTI:   1.92 cm     TR Vmax:        185.00 cm/s MV Peak grad:  4.4 mmHg MV Mean grad:  2.0 mmHg     SHUNTS MV Vmax:       1.05 m/s     Systemic VTI:  0.16 m MV Vmean:      68.3 cm/s  Systemic Diam: 2.00 cm MV Decel Time: 167 msec MV E velocity: 105.00 cm/s MV A velocity: 81.40 cm/s MV E/A ratio:  1.29 Dwayne Prince Rome MD Electronically signed by Yolonda Kida MD Signature Date/Time: 11/03/2021/3:19:50 PM    Final       ASSESSMENT & PLAN:  1. Invasive carcinoma of breast ([place])   2. Hypokalemia   3. Encounter for antineoplastic chemotherapy   4. Use of tamoxifen (Nolvadex)    Cancer Staging  Invasive carcinoma of breast ([place]) Staging form: Breast, AJCC 8th Edition - Pathologic stage from 04/22/2021: Stage IA (pT1c, pN0, cM0, G2, ER+, PR+, HER2+) - Signed by Anna Server, MD on 04/22/2021    Cancer Staging  Invasive carcinoma of breast [facility]) Staging form: Breast, AJCC 8th Edition - Pathologic stage from 04/22/2021: Stage IA (pT1c, pN0, cM0, G2, ER+, PR+, HER2+) - Signed by Anna Server, MD on 04/22/2021  #Left invasive carcinoma of breast,pT1c pN0 cM0 status postlumpectomy and sentinel lymph node biopsy- HER2 positive, ER 11-50% positive,  PR 1-10% positive.   Patient has completed adjuvant chemotherapy with Taxol weekly x12. Labs are reviewed and discussed with patient. Proceed with transtuzumab maintenance if CMP is within treatment parameter.  11/02/2021 echo showed stable and normal LVEF.  Recommend Tamoxifen 5m daily.  Side effects of tamoxifen including but not limited to hot flush,  fatigue, retinopathy, thrombosis, uterus endometrial cancer discussed with patient. Patient voices understanding and is willing to proceed with treatment.  Refer to gyn for annual pelvic exam.   #Heartburn, occasional nausea and vomiting.  Recommend patient to try omeprazole 20 mg daily for short-term.  Symptom has improved. Finish course and stop.   Hypokalemia, potassium has improved.   Continue potassium chloride.20 meq daily.    We spent sufficient time to discuss many aspect of care, questions were answered to patient's satisfaction.   All questions were answered. The patient knows to call the clinic with any problems questions or concerns.  cc AKirk Ruths MD   Follow-up in  3 weeks lab MD trastuzumab maintenance and evaluation of tolerability of Tamoxifen.   ZEarlie Server MD, PhD. 11/04/2021

## 2021-11-11 ENCOUNTER — Ambulatory Visit
Admission: RE | Admit: 2021-11-11 | Discharge: 2021-11-11 | Disposition: A | Discharge status: Home / Self Care | Payer: Medicare Other | Source: Ambulatory Visit | Attending: Radiation Oncology | Admitting: Radiation Oncology

## 2021-11-11 ENCOUNTER — Other Ambulatory Visit: Payer: Self-pay

## 2021-11-11 VITALS — BP 125/80 | HR 98 | Temp 97.5°F | Resp 18 | Ht 62.0 in | Wt 109.6 lb

## 2021-11-11 DIAGNOSIS — C50412 Malignant neoplasm of upper-outer quadrant of left female breast: Secondary | ICD-10-CM | POA: Insufficient documentation

## 2021-11-11 DIAGNOSIS — Z923 Personal history of irradiation: Secondary | ICD-10-CM | POA: Insufficient documentation

## 2021-11-11 DIAGNOSIS — C50919 Malignant neoplasm of unspecified site of unspecified female breast: Secondary | ICD-10-CM

## 2021-11-11 DIAGNOSIS — Z79810 Long term (current) use of selective estrogen receptor modulators (SERMs): Secondary | ICD-10-CM | POA: Insufficient documentation

## 2021-11-11 DIAGNOSIS — Z170 Estrogen receptor positive status [ER+]: Secondary | ICD-10-CM | POA: Insufficient documentation

## 2021-11-11 NOTE — Progress Notes (Signed)
Radiation Oncology ?Follow up Note ? ?Name: Anna Banks   ?Date:   11/11/2021 ?MRN:  [id number] ?DOB: 03/10/1976  ? ? ?This 46 y.o. female presents to the clinic today for 1 month follow-up status post whole breast radiation to her left breast for stage Ia ER low PR positive and HER2/neu overexpressed invasive mammary carcinoma. ? ?REFERRING PROVIDER: Kirk Ruths, MD ? ?HPI: Patient is a 46 year old female now at 1 month having completed whole breast radiation to her left breast for stage Ia HER2/neu overexpressed invasive mammary carcinoma seen today in routine follow-up she is doing well.  She specifically denies breast tenderness cough or bone pain..  She is currently ontranstuzumab maintenance she is also been prescribed tamoxifen. ? ?COMPLICATIONS OF TREATMENT: none ? ?FOLLOW UP COMPLIANCE: keeps appointments  ? ?PHYSICAL EXAM:  ?BP 125/80   Pulse 98   Temp (!) 97.5 ?F (36.4 ?C)   Resp 18   Ht 5' 2"  (1.575 m)   Wt 109 lb 9.6 oz (49.7 kg)   BMI 20.05 kg/m?  ?Lungs are clear to A&P cardiac examination essentially unremarkable with regular rate and rhythm. No dominant mass or nodularity is noted in either breast in 2 positions examined. Incision is well-healed. No axillary or supraclavicular adenopathy is appreciated. Cosmetic result is excellent.  Well-developed well-nourished patient in NAD. HEENT reveals PERLA, EOMI, discs not visualized.  Oral cavity is clear. No oral mucosal lesions are identified. Neck is clear without evidence of cervical or supraclavicular adenopathy. Lungs are clear to A&P. Cardiac examination is essentially unremarkable with regular rate and rhythm without murmur rub or thrill. Abdomen is benign with no organomegaly or masses noted. Motor sensory and DTR levels are equal and symmetric in the upper and lower extremities. Cranial nerves II through XII are grossly intact. Proprioception is intact. No peripheral adenopathy or edema is identified. No motor or sensory levels are  noted. Crude visual fields are within normal range. ? ?RADIOLOGY RESULTS: No current films to review ? ?PLAN: Present time patient continues to do well 1 month out from whole breast radiation and pleased with her overall progress.  She continues ontranstuzumab maintenance.  And pleased with her overall progress have asked to see her back in 4 to 5 months for follow-up.  She continues close follow-up care and treatment with medical oncology. ? ?I would like to take this opportunity to thank you for allowing me to participate in the care of your patient.. ?  ? Noreene Filbert, MD ? ?

## 2021-11-27 ENCOUNTER — Encounter: Payer: Self-pay | Admitting: Nurse Practitioner

## 2021-11-27 ENCOUNTER — Other Ambulatory Visit: Payer: Self-pay

## 2021-11-27 ENCOUNTER — Inpatient Hospital Stay: Payer: Medicare Other

## 2021-11-27 ENCOUNTER — Inpatient Hospital Stay: Payer: Medicare Other | Attending: Nurse Practitioner

## 2021-11-27 ENCOUNTER — Inpatient Hospital Stay (HOSPITAL_BASED_OUTPATIENT_CLINIC_OR_DEPARTMENT_OTHER): Payer: Medicare Other | Admitting: Nurse Practitioner

## 2021-11-27 VITALS — BP 130/79 | HR 103 | Temp 97.0°F | Wt 110.0 lb

## 2021-11-27 DIAGNOSIS — Z79899 Other long term (current) drug therapy: Secondary | ICD-10-CM

## 2021-11-27 DIAGNOSIS — R12 Heartburn: Secondary | ICD-10-CM | POA: Insufficient documentation

## 2021-11-27 DIAGNOSIS — Z5111 Encounter for antineoplastic chemotherapy: Secondary | ICD-10-CM | POA: Diagnosis not present

## 2021-11-27 DIAGNOSIS — Z5181 Encounter for therapeutic drug level monitoring: Secondary | ICD-10-CM | POA: Diagnosis not present

## 2021-11-27 DIAGNOSIS — C50919 Malignant neoplasm of unspecified site of unspecified female breast: Secondary | ICD-10-CM | POA: Diagnosis not present

## 2021-11-27 DIAGNOSIS — C50912 Malignant neoplasm of unspecified site of left female breast: Secondary | ICD-10-CM | POA: Diagnosis present

## 2021-11-27 DIAGNOSIS — Z5112 Encounter for antineoplastic immunotherapy: Secondary | ICD-10-CM | POA: Insufficient documentation

## 2021-11-27 DIAGNOSIS — R112 Nausea with vomiting, unspecified: Secondary | ICD-10-CM | POA: Insufficient documentation

## 2021-11-27 DIAGNOSIS — Z79810 Long term (current) use of selective estrogen receptor modulators (SERMs): Secondary | ICD-10-CM | POA: Insufficient documentation

## 2021-11-27 DIAGNOSIS — E876 Hypokalemia: Secondary | ICD-10-CM | POA: Insufficient documentation

## 2021-11-27 DIAGNOSIS — Z170 Estrogen receptor positive status [ER+]: Secondary | ICD-10-CM | POA: Diagnosis not present

## 2021-11-27 LAB — CBC WITH DIFFERENTIAL/PLATELET
Abs Immature Granulocytes: 0.01 10*3/uL (ref 0.00–0.07)
Basophils Absolute: 0 10*3/uL (ref 0.0–0.1)
Basophils Relative: 1 %
Eosinophils Absolute: 0.3 10*3/uL (ref 0.0–0.5)
Eosinophils Relative: 5 %
HCT: 37.6 % (ref 36.0–46.0)
Hemoglobin: 12.2 g/dL (ref 12.0–15.0)
Immature Granulocytes: 0 %
Lymphocytes Relative: 27 %
Lymphs Abs: 1.5 10*3/uL (ref 0.7–4.0)
MCH: 30.7 pg (ref 26.0–34.0)
MCHC: 32.4 g/dL (ref 30.0–36.0)
MCV: 94.5 fL (ref 80.0–100.0)
Monocytes Absolute: 0.5 10*3/uL (ref 0.1–1.0)
Monocytes Relative: 9 %
Neutro Abs: 3.3 10*3/uL (ref 1.7–7.7)
Neutrophils Relative %: 58 %
Platelets: 330 10*3/uL (ref 150–400)
RBC: 3.98 MIL/uL (ref 3.87–5.11)
RDW: 12.4 % (ref 11.5–15.5)
WBC: 5.7 10*3/uL (ref 4.0–10.5)
nRBC: 0 % (ref 0.0–0.2)

## 2021-11-27 LAB — COMPREHENSIVE METABOLIC PANEL
ALT: 17 U/L (ref 0–44)
AST: 30 U/L (ref 15–41)
Albumin: 3.8 g/dL (ref 3.5–5.0)
Alkaline Phosphatase: 48 U/L (ref 38–126)
Anion gap: 10 (ref 5–15)
BUN: 23 mg/dL — ABNORMAL HIGH (ref 6–20)
CO2: 24 mmol/L (ref 22–32)
Calcium: 9.6 mg/dL (ref 8.9–10.3)
Chloride: 105 mmol/L (ref 98–111)
Creatinine, Ser: 0.71 mg/dL (ref 0.44–1.00)
GFR, Estimated: >60 mL/min (ref >60)
Glucose, Bld: 137 mg/dL — ABNORMAL HIGH (ref 70–99)
Potassium: 3.7 mmol/L (ref 3.5–5.1)
Sodium: 139 mmol/L (ref 135–145)
Total Bilirubin: 0.4 mg/dL (ref 0.3–1.2)
Total Protein: 7.1 g/dL (ref 6.5–8.1)

## 2021-11-27 LAB — PREGNANCY, URINE: Preg Test, Ur: NEGATIVE

## 2021-11-27 MED ORDER — SODIUM CHLORIDE 0.9 % IV SOLN
Freq: Once | INTRAVENOUS | Status: AC
  Filled 2021-11-27: qty 250

## 2021-11-27 MED ORDER — DIPHENHYDRAMINE HCL 25 MG PO CAPS
50.0000 mg | ORAL_CAPSULE | Freq: Once | ORAL | Status: AC
  Administered 2021-11-27: 50 mg via ORAL
  Filled 2021-11-27: qty 2

## 2021-11-27 MED ORDER — ACETAMINOPHEN 325 MG PO TABS
650.0000 mg | ORAL_TABLET | Freq: Once | ORAL | Status: AC
  Administered 2021-11-27: 650 mg via ORAL
  Filled 2021-11-27: qty 2

## 2021-11-27 MED ORDER — TRASTUZUMAB-DKST CHEMO 150 MG IV SOLR
300.0000 mg | Freq: Once | INTRAVENOUS | Status: AC
  Administered 2021-11-27: 300 mg via INTRAVENOUS
  Filled 2021-11-27: qty 14.29

## 2021-11-27 NOTE — Patient Instructions (Signed)
[facility] AT [place]  Discharge Instructions: ?Thank you for choosing [facility] to provide your oncology and hematology care.  ?If you have a lab appointment with the [facility], please go directly to the [facility] and check in at the registration area. ? ?Wear comfortable clothing and clothing appropriate for easy access to any Portacath or PICC line.  ? ?We strive to give you quality time with your provider. You may need to reschedule your appointment if you arrive late (15 or more minutes).  Arriving late affects you and other patients whose appointments are after yours.  Also, if you miss three or more appointments without notifying the office, you may be dismissed from the clinic at the provider?s discretion.    ?  ?For prescription refill requests, have your pharmacy contact our office and allow 72 hours for refills to be completed.   ? ?Today you received the following chemotherapy and/or immunotherapy agents OGIVIRI ?    ?  ?To help prevent nausea and vomiting after your treatment, we encourage you to take your nausea medication as directed. ? ?BELOW ARE SYMPTOMS THAT SHOULD BE REPORTED IMMEDIATELY: ?*FEVER GREATER THAN 100.4 F (38 ?C) OR HIGHER ?*CHILLS OR SWEATING ?*NAUSEA AND VOMITING THAT IS NOT CONTROLLED WITH YOUR NAUSEA MEDICATION ?*UNUSUAL SHORTNESS OF BREATH ?*UNUSUAL BRUISING OR BLEEDING ?*URINARY PROBLEMS (pain or burning when urinating, or frequent urination) ?*BOWEL PROBLEMS (unusual diarrhea, constipation, pain near the anus) ?TENDERNESS IN MOUTH AND THROAT WITH OR WITHOUT PRESENCE OF ULCERS (sore throat, sores in mouth, or a toothache) ?UNUSUAL RASH, SWELLING OR PAIN  ?UNUSUAL VAGINAL DISCHARGE OR ITCHING  ? ?Items with * indicate a potential emergency and should be followed up as soon as possible or go to the Emergency Department if any problems should occur. ? ?Please show the CHEMOTHERAPY ALERT CARD or IMMUNOTHERAPY ALERT CARD at check-in to  the Emergency Department and triage nurse. ? ?Should you have questions after your visit or need to cancel or reschedule your appointment, please contact [facility] [place] AT [place]  [phone] and follow the prompts.  Office hours are 8:00 a.m. to 4:30 p.m. Monday - Friday. Please note that voicemails left after 4:00 p.m. may not be returned until the following business day.  We are closed weekends and major holidays. You have access to a nurse at all times for urgent questions. Please call the main number to the clinic [phone] and follow the prompts. ? ?For any non-urgent questions, you may also contact your provider using MyChart. We now offer e-Visits for anyone 4 and older to request care online for non-urgent symptoms. For details visit [email or web address]. ?  ?Also download the MyChart app! Go to the app store, search "MyChart", open the app, select [facility], and log in with your MyChart username and password. ? ?Due to Covid, a mask is required upon entering the hospital/clinic. If you do not have a mask, one will be given to you upon arrival. For doctor visits, patients may have 1 support person aged 58 or older with them. For treatment visits, patients cannot have anyone with them due to current Covid guidelines and our immunocompromised population.  ? ?Trastuzumab injection for infusion ???y l? thu?c g?? ?TRASTUZUMAB l? m?t kh?ng th? ??n d?ng. Thu?c ???c d?ng ?? ?i?u tr? ung th? v? v? ung th? bao t?. ?Thu?c n?y c? th? ???c d?ng cho nh?ng m?c ??ch kh?c; h?y h?i ng??i cung c?p d?ch v? y t? ho?c d??c s? c?a m?nh,  n?u qu? v? c? th?c m?c. ?(C?C) NH?N HI?U PH? BI?N: [place], Elliot Gault, KANJINTI, [place], [place], [place] ?T?i c?n ph?i b?o cho ng??i cung c?p d?ch v? y t? c?a m?nh ?i?u g? tr??c khi d?ng thu?c n?y? ?H? c?n bi?t li?u qu? v? c? b?t k? t?nh tr?ng n?o sau ??y kh?ng: ?b??nh tim ?suy tim ?b??nh ph??i ho??c h? h?p, ch??ng ha?n nh? hen suy?n ?pha?n ??ng b??t th???ng  ho??c di? ??ng v??i trastuzumab ho?c c?n benzyl ?pha?n ??ng b??t th???ng ho??c di? ??ng v??i ca?c d??c ph?m kha?c, th?c ph?m, thu??c nhu??m, ho??c ch??t ba?o qua?n ??ang c? thai ho??c ??nh co? thai ??ang cho con bu? ?T?i n?n s? d?ng thu?c n?y nh? th? n?o? ?Thu?c n?y ?? truy?n v?o t?nh m?ch. Thu?c n?y ???c cho trong b?nh vi?n ho?c ph?ng m?ch b?i chuy?n vi?n y t? ???c hu?n luy?n ??c bi?t. ?H?y b?n v?i b?c s? nhi khoa c?a qu? v? v? vi?c d?ng thu?c n?y ? tr? em. Thu?c n?y kh?ng ???c duy?t ?? d?ng cho tr? em. ?Qu? li?u: N?u qu? v? cho r?ng m?nh ?? d?ng qu? nhi?u thu?c n?y, th? h?y li?n l?c v?i trung t?m ki?m so?t ch?t ??c ho?c ph?ng c?p c?u ngay l?p t?c. ?L?U ?: Thu?c n?y ch? d?nh ri?ng cho qu? v?. Kh?ng chia s? thu?c n?y v?i nh?ng ng??i kh?c. ?N?u t?i l? qu?n m?t li?u th? sao? ??i?u quan tr?ng l? kh?ng n?n b? l? li?u thu?c n?o. H?y li?n l?c v?i b?c s? ho?c chuy?n vi?n y t? c?a m?nh, n?u qu? v? kh?ng th? gi? ??ng cu?c h?n kh?m. ?Nh?ng g? c? th? t??ng t?c v?i thu?c n?y? ?Thu?c n?y c? th? t??ng t?c v?i c?c thu?c sau ??y: ?m?t s? lo?i thu?c h?a tr? li?u, nh? l? daunorubicin, doxorubicin, epirubicin v? idarubicin ?Danh s?ch n?y c? th? kh?ng m? t? ?? h?t c?c t??ng t?c c? th? x?y ra. H?y ??a cho ng??i cung c?p d?ch v? y t? c?a m?nh danh s?ch t?t c? c?c thu?c, th?o d??c, c?c thu?c kh?ng c?n toa, ho?c c?c ch? ph?m b? sung m? qu? v? d?ng. C?ng n?n b?o cho h? bi?t r?ng qu? v? c? h?t thu?c, u?ng r??u, ho?c c? s? d?ng ma t?y tr?i ph?p hay kh?ng. V?i th? c? th? t??ng t?c v?i thu?c c?a qu? v?. ?T?i c?n ph?i theo d?i ?i?u g? trong khi d?ng thu?c n?y? ?H?y ?i g?p b?c s? ho?c chuy?n vi?n y t? ?? theo d?i ??nh k? s? c?i thi?n c?a qu? v?. H?y t??ng tr?nh m?i t?c d?ng ph?. H?y ti?p t?c ??t ?i?u tr? c?a m?nh ngay c? khi qu? v? c?m th?y m?t, tr? khi b?c s? y?u c?u qu? v? ng?ng ?i?u tr?. ?H?y h?i ? ki?n b?c s? ho?c chuy?n vi?n y t?, n?u qu? v? b? s?t, ?n l?nh ho?c ?au h?ng, ho?c c? c?c tri?u ch?ng kh?c c?a c?m l?nh ho?c c?m. Kh?ng ???c t? ?i?u  tr? cho m?nh. H?y c? tr?nh ? g?n nh?ng ng??i b? b?nh. ?Qu? v? c? th? b? s?t, c?m th?y ?n l?nh v? run trong l?n truy?n ??u ti?n. Nh?ng t?c d?ng ph? n?y th??ng nh? v? c? th? ???c ?i?u tr? b?ng c?c thu?c kh?c. H?y t??ng tr?nh cho b?c s? ho?c chuy?n vi?n y t? m?i t?c d?ng ph? trong khi truy?n. S?t v? ?n l?nh th??ng kh?ng x?y ra trong c?c l?n truy?n sau. ?Kh?ng ???c c? [place] trong th?i gian d?ng thu?c n?y ho?c trong v?ng 7 th?ng sau khi ng?ng d?ng thu?c n?y. Ph? n? c?n ph?i th?ng b?o cho b?c s?  c?a m?nh, n?u mu?n c? thai ho?c ngh? r?ng c? th? m?nh ?? c? [place]. Phu? n?? co? kha? n?ng mang thai c??n pha?i co? x?t nghi?m th? thai ?m t?nh tr??c khi b?t ??u d?ng thu??c na?y. C? nguy c? v? c?c t?c d?ng ph? nghi?m tr?ng ??i v?i [place] nhi. H?y th?o lu?n v?i b?c s? ho?c chuy?n vi?n y t? ho?c d??c s? ?? bi?t th?m th?ng tin. Kh?ng ???c cho con b? trong th?i gian d?ng thu?c n?y ho?c trong v?ng 7 th?ng sau khi ng?ng d?ng thu?c. ?Ph? n? ph?i s? d?ng bi?n ph?p ng?a [place] hi?u qu? c?ng v?i thu?c n?y. ?T?i c? th? nh?n th?y nh?ng t?c d?ng ph? n?o khi d?ng thu?c n?y? ?Nh?ng t?c d?ng ph? qu? v? c?n ph?i b?o cho b?c s? ho?c chuy?n vi?n y t? c?ng s?m c?ng t?t: ?c?c ph?n ?ng d? ?ng, ch?ng h?n nh? da b? m?n ??, ng?a, n?i m?y ?ay, s?ng ? m?t, m?i, ho?c l??i ??au ng?c ???nh tr?ng ng?c ?ho ?ch?ng m?t ?c?m th?y cho?ng v?ng, ng?t x?u, b? t? ?s?t ?c?m th?y m?t m?i ho?c c?c tri?u ch?ng gi?ng c?m ?c?c d?u hi?u suy tim n?ng h?n, ch?ng h?n nh? c?c v?n ?? v? h? h?p; b? s?ng ? ch?n v? b?n ch?n c?a m?nh ?m?t m?i ho?c y?u ?t b?t th??ng ?C?c t?c d?ng ph? kh?ng c?n ph?i ch?m s?c y t? (h?y b?o cho b?c s? ho?c chuy?n vi?n y t?, n?u c?c t?c d?ng ph? n?y ti?p di?n ho?c g?y phi?n to?i): ??au x??ng ?th?y v? c?a c?c m?n ?n thay ??i ?ti?u ch?y ??au ho?c nh?c kh?p ?bu?n ?i ho?c ?i m?a ?gi?m c?n ?Danh s?ch n?y c? th? kh?ng m? t? ?? h?t c?c t?c d?ng ph? c? th? x?y ra. Xin g?i t?i b?c s? c?a m?nh ?? ???c c? v?n chuy?n m?n v? c?c t?c d?ng ph?. Qu? v? c? th? t??ng tr?nh c?c t?c  d?ng ph? cho FDA theo s? [phone]. ?T?i n?n c?t gi? thu?c c?a m?nh ? ??u? ?Thu?c n?y ???c s? d?ng b?i chuy?n vi?n y t? ? b?nh vi?n ho?c ? ph?ng m?ch. Qu? v? s? kh?ng ???c c?p thu?c n?y ?? c?t gi? t

## 2021-11-27 NOTE — Progress Notes (Signed)
Ok to infuse with hr 103 per Beckey Rutter ?

## 2021-11-27 NOTE — Progress Notes (Signed)
?Hematology/Oncology Progress note ?[phone] [phone] ?  ? ?Patient Care Team: ?Kirk Ruths, MD as PCP - General (Internal Medicine) ?Earlie Server, MD as Consulting Physician (Oncology) ? ?REFERRING PROVIDER: ?Kirk Ruths, MD  ? ?CHIEF COMPLAINTS/REASON FOR VISIT:  ?Follow up for treatment of breast cancer ? ?HISTORY OF PRESENTING ILLNESS:  ? ?Anna Banks is a  46 y.o.  female with PMH listed below was seen in consultation at the request of  Kirk Ruths, MD  for evaluation of breast cancer.  ? ?Patient was accompanied by her half sister today. Per sister, patient has history of seizure since childhood and has intellectual/cognitive impairment. She is illiterate. She follows instructions from her family members.  ?She lives with her mother and her half sister who takes of her and helps her to make medical decision.   ?They waived interpretor. Both patient and her half sister speak [place]. ? ?Menarche 72 years of age. G0P0. ?No child, no previous pregnancy. Per her sister, patient is not sexually active ?No prior use of birth control pills or hormone replacement therapy. ?Denies previous history of biopsy. ? ?02/16/2021, screening mammogram showed calcification in the right breast needs further evaluation.  Left breast possible mass with distortion requires further evaluation. ?02/26/2021, left breast showed a 10 mm indeterminate mass, 2:00, 8 cm from nipple.  No mammographic evidence of malignancy in the right breast.  No left axillary lymphadenopathy. ?03/10/2021, patient underwent ultrasound-guided biopsy of left breast mass.  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  ER low positive [1-10%], PR positive [1-10%], HER2 positive by IHC 3+ ? ?# 04/09/2021, status postlumpectomy and sentinel lymph node biopsy. ?Invasive mammary carcinoma, no special type, 4 lymph nodes all negative for  metastatic carcinoma.  DCIS present.  LVI not identified.Grade 2, all margins negative for invasive carcinoma.pT1c pN0, ER [staining was repeated on surgical specimen] [11-50%]+ PR [1-10%]+, HER 2+ ? ?Baseline echocardiogram showed normal LVEF. ? ?07/28/2021, echocardiogram showed LVEF is normal 60 to 65%. ? ?INTERVAL HISTORY: Anna dung Bohnet is a 46 y.o. female with above history of breast cancer currently receiving trastuzumab for HER2 positivity who returns to clinic for follow-up and consideration of trastuzumab.  At previous visit she was started on tamoxifen.  Tolerating well.  No longer requiring omeprazole.  She feels well and denies complaints today.  No shortness of breath or leg swelling.  No chest pain.  No nausea or vomiting. ? ?10/01/2021 She has finished adjuvant radiation of the breast ? ?Review of Systems  ?Constitutional:  Negative for appetite change, fatigue and unexpected weight change.  ?HENT:   Negative for mouth sores, sore throat and trouble swallowing.   ?Respiratory:  Negative for chest tightness and shortness of breath.   ?Cardiovascular:  Negative for leg swelling.  ?Gastrointestinal:  Negative for abdominal pain, constipation, diarrhea, nausea and vomiting.  ?Genitourinary:  Negative for bladder incontinence, dysuria, menstrual problem and pelvic pain.   ?Musculoskeletal:  Negative for flank pain and neck stiffness.  ?Skin:  Negative for itching, rash and wound.  ?Neurological:  Negative for dizziness, headaches, light-headedness and numbness.  ?Hematological:  Negative for adenopathy.  ?Psychiatric/Behavioral:  Negative for confusion, depression and sleep disturbance. The patient is not nervous/anxious.   ? ?MEDICAL HISTORY:  ?Past Medical History:  ?Diagnosis Date  ? Down syndrome   ? Seizure (Uncertain)   ? ? ?SURGICAL HISTORY: ?Past Surgical History:  ?Procedure Laterality Date  ?  BREAST BIOPSY Left 03/10/2021  ? [place] BX,ribbon clip, IMC  ? BREAST LUMPECTOMY,RADIO FREQ LOCALIZER,AXILLARY  SENTINEL LYMPH NODE BIOPSY Left 04/09/2021  ? Procedure: BREAST LUMPECTOMY,RADIO FREQ LOCALIZER,AXILLARY SENTINEL LYMPH NODE BIOPSY;  Surgeon: Benjamine Sprague, DO;  Location: [facility] ORS;  Service: General;  Laterality: Left;  ? ? ?SOCIAL HISTORY: ?Social History  ? ?Socioeconomic History  ? Marital status: Single  ?  Spouse name: Not on file  ? Number of children: Not on file  ? Years of education: Not on file  ? Highest education level: Not on file  ?Occupational History  ? Not on file  ?Tobacco Use  ? Smoking status: Never  ? Smokeless tobacco: Never  ?Vaping Use  ? Vaping Use: Never used  ?Substance and Sexual Activity  ? Alcohol use: Never  ? Drug use: Not Currently  ? Sexual activity: Not on file  ?Other Topics Concern  ? Not on file  ?Social History Narrative  ? Not on file  ? ?Social Determinants of Health  ? ?Financial Resource Strain: Not on file  ?Food Insecurity: Not on file  ?Transportation Needs: Not on file  ?Physical Activity: Not on file  ?Stress: Not on file  ?Social Connections: Not on file  ?Intimate Partner Violence: Not on file  ? ? ?FAMILY HISTORY: ?History reviewed. No pertinent family history. ? ?ALLERGIES:  has No Known Allergies. ? ?MEDICATIONS:  ?Current Outpatient Medications  ?Medication Sig Dispense Refill  ? acetaminophen (TYLENOL) 325 MG tablet Take 325-650 mg by mouth every 6 (six) hours as needed for moderate pain.    ? ethosuximide (ZARONTIN) 250 MG capsule Take 250 mg by mouth 2 (two) times daily.    ? HYDROcodone-acetaminophen (NORCO) 5-325 MG tablet Take 1 tablet by mouth every 6 (six) hours as needed for up to 6 doses for moderate pain. 6 tablet 0  ? KLOR-CON M20 20 MEQ tablet TAKE 1 TABLET BY MOUTH EVERY DAY 90 tablet 1  ? omeprazole (PRILOSEC) 20 MG capsule Take 1 capsule (20 mg total) by mouth daily. 30 capsule 0  ? ondansetron (ZOFRAN) 8 MG tablet Take 1 tablet (8 mg total) by mouth 2 (two) times daily as needed (Nausea or vomiting). 30 tablet 1  ? tamoxifen (NOLVADEX) 20 MG  tablet Take 1 tablet (20 mg total) by mouth daily. 30 tablet 3  ? VITAMIN D PO Take 1 capsule by mouth daily.    ? ?No current facility-administered medications for this visit.  ? ? ? ?PHYSICAL EXAMINATION: ?ECOG PERFORMANCE STATUS: 0 - Asymptomatic ?Vitals:  ? 11/27/21 0840  ?BP: 130/79  ?Pulse: (!) 103  ?Temp: (!) 97 ?F (36.1 ?C)  ? ?Filed Weights  ? 11/27/21 0840  ?Weight: 110 lb (49.9 kg)  ? ? ?Physical Exam ?Constitutional:   ?   General: She is not in acute distress. ?   Appearance: She is well-developed. She is not ill-appearing.  ?   Comments: Thin build  ?HENT:  ?   Head: Atraumatic.  ?   Nose: Nose normal.  ?   Mouth/Throat:  ?   Pharynx: No oropharyngeal exudate.  ?Eyes:  ?   General: No scleral icterus. ?   Conjunctiva/sclera: Conjunctivae normal.  ?Cardiovascular:  ?   Rate and Rhythm: Normal rate and regular rhythm.  ?Pulmonary:  ?   Effort: Pulmonary effort is normal.  ?   Breath sounds: Normal breath sounds.  ?Abdominal:  ?   General: Bowel sounds are normal. There is no distension.  ?  Palpations: Abdomen is soft.  ?   Tenderness: There is no abdominal tenderness.  ?Musculoskeletal:     ?   General: No tenderness or deformity. Normal range of motion.  ?   Cervical back: Normal range of motion and neck supple.  ?Skin: ?   General: Skin is warm and dry.  ?   Coloration: Skin is not pale.  ?Neurological:  ?   General: No focal deficit present.  ?   Mental Status: She is alert and oriented to person, place, and time.  ?Psychiatric:     ?   Mood and Affect: Mood normal.     ?   Behavior: Behavior normal.  ? ? ?LABORATORY DATA:  ?I have reviewed the data as listed ?Lab Results  ?Component Value Date  ? WBC 5.7 11/27/2021  ? HGB 12.2 11/27/2021  ? HCT 37.6 11/27/2021  ? MCV 94.5 11/27/2021  ? PLT 330 11/27/2021  ? ?Recent Labs  ?  09/23/21 ?0820 10/14/21 ?9597 11/04/21 ?4718  ?NA 133* 137 137  ?K 3.8 3.5 3.7  ?CL 102 102 103  ?CO2 24 24 25   ?GLUCOSE 138* 143* 118*  ?BUN 12 10 19   ?CREATININE 0.64 0.79  0.66  ?CALCIUM 9.3 10.0 10.3  ?GFRNONAA >60 >60 >60  ?PROT 7.8 7.8 8.0  ?ALBUMIN 4.3 4.5 4.3  ?AST 26 25 29   ?ALT 15 13 20   ?ALKPHOS 53 54 55  ?BILITOT 0.4 0.5 0.6  ? ? ?Iron/TIBC/Ferritin/ %Sat ?No results found for: IR

## 2021-12-02 ENCOUNTER — Ambulatory Visit: Payer: Medicare Other

## 2021-12-02 ENCOUNTER — Ambulatory Visit: Payer: Medicare Other | Admitting: Oncology

## 2021-12-02 ENCOUNTER — Other Ambulatory Visit: Payer: Medicare Other

## 2021-12-18 ENCOUNTER — Inpatient Hospital Stay: Payer: Medicare Other

## 2021-12-18 ENCOUNTER — Encounter: Payer: Self-pay | Admitting: Oncology

## 2021-12-18 ENCOUNTER — Inpatient Hospital Stay (HOSPITAL_BASED_OUTPATIENT_CLINIC_OR_DEPARTMENT_OTHER): Payer: Medicare Other | Admitting: Oncology

## 2021-12-18 ENCOUNTER — Inpatient Hospital Stay: Payer: Medicare Other | Attending: Oncology

## 2021-12-18 VITALS — BP 127/64 | HR 88 | Temp 98.7°F | Resp 20 | Wt 112.5 lb

## 2021-12-18 DIAGNOSIS — E876 Hypokalemia: Secondary | ICD-10-CM | POA: Diagnosis not present

## 2021-12-18 DIAGNOSIS — C50919 Malignant neoplasm of unspecified site of unspecified female breast: Secondary | ICD-10-CM

## 2021-12-18 DIAGNOSIS — Z5111 Encounter for antineoplastic chemotherapy: Secondary | ICD-10-CM | POA: Diagnosis not present

## 2021-12-18 DIAGNOSIS — Z79810 Long term (current) use of selective estrogen receptor modulators (SERMs): Secondary | ICD-10-CM | POA: Insufficient documentation

## 2021-12-18 DIAGNOSIS — Z5112 Encounter for antineoplastic immunotherapy: Secondary | ICD-10-CM | POA: Insufficient documentation

## 2021-12-18 DIAGNOSIS — Z170 Estrogen receptor positive status [ER+]: Secondary | ICD-10-CM | POA: Diagnosis not present

## 2021-12-18 DIAGNOSIS — C50212 Malignant neoplasm of upper-inner quadrant of left female breast: Secondary | ICD-10-CM | POA: Insufficient documentation

## 2021-12-18 LAB — COMPREHENSIVE METABOLIC PANEL
ALT: 14 U/L (ref 0–44)
AST: 28 U/L (ref 15–41)
Albumin: 3.8 g/dL (ref 3.5–5.0)
Alkaline Phosphatase: 46 U/L (ref 38–126)
Anion gap: 7 (ref 5–15)
BUN: 14 mg/dL (ref 6–20)
CO2: 25 mmol/L (ref 22–32)
Calcium: 9.4 mg/dL (ref 8.9–10.3)
Chloride: 104 mmol/L (ref 98–111)
Creatinine, Ser: 0.75 mg/dL (ref 0.44–1.00)
GFR, Estimated: >60 mL/min (ref >60)
Glucose, Bld: 120 mg/dL — ABNORMAL HIGH (ref 70–99)
Potassium: 3.7 mmol/L (ref 3.5–5.1)
Sodium: 136 mmol/L (ref 135–145)
Total Bilirubin: 0.5 mg/dL (ref 0.3–1.2)
Total Protein: 7.2 g/dL (ref 6.5–8.1)

## 2021-12-18 LAB — CBC WITH DIFFERENTIAL/PLATELET
Abs Immature Granulocytes: 0 10*3/uL (ref 0.00–0.07)
Basophils Absolute: 0 10*3/uL (ref 0.0–0.1)
Basophils Relative: 1 %
Eosinophils Absolute: 0.2 10*3/uL (ref 0.0–0.5)
Eosinophils Relative: 6 %
HCT: 35.6 % — ABNORMAL LOW (ref 36.0–46.0)
Hemoglobin: 11.7 g/dL — ABNORMAL LOW (ref 12.0–15.0)
Immature Granulocytes: 0 %
Lymphocytes Relative: 31 %
Lymphs Abs: 1.3 10*3/uL (ref 0.7–4.0)
MCH: 30.8 pg (ref 26.0–34.0)
MCHC: 32.9 g/dL (ref 30.0–36.0)
MCV: 93.7 fL (ref 80.0–100.0)
Monocytes Absolute: 0.3 10*3/uL (ref 0.1–1.0)
Monocytes Relative: 8 %
Neutro Abs: 2.3 10*3/uL (ref 1.7–7.7)
Neutrophils Relative %: 54 %
Platelets: 321 10*3/uL (ref 150–400)
RBC: 3.8 MIL/uL — ABNORMAL LOW (ref 3.87–5.11)
RDW: 13 % (ref 11.5–15.5)
WBC: 4.1 10*3/uL (ref 4.0–10.5)
nRBC: 0 % (ref 0.0–0.2)

## 2021-12-18 LAB — PREGNANCY, URINE: Preg Test, Ur: NEGATIVE

## 2021-12-18 MED ORDER — DIPHENHYDRAMINE HCL 25 MG PO CAPS
50.0000 mg | ORAL_CAPSULE | Freq: Once | ORAL | Status: AC
  Administered 2021-12-18: 50 mg via ORAL
  Filled 2021-12-18: qty 2

## 2021-12-18 MED ORDER — ACETAMINOPHEN 325 MG PO TABS
650.0000 mg | ORAL_TABLET | Freq: Once | ORAL | Status: AC
  Administered 2021-12-18: 650 mg via ORAL
  Filled 2021-12-18: qty 2

## 2021-12-18 MED ORDER — SODIUM CHLORIDE 0.9 % IV SOLN
Freq: Once | INTRAVENOUS | Status: AC
  Filled 2021-12-18: qty 250

## 2021-12-18 MED ORDER — TRASTUZUMAB-DKST CHEMO 150 MG IV SOLR
300.0000 mg | Freq: Once | INTRAVENOUS | Status: AC
  Administered 2021-12-18: 300 mg via INTRAVENOUS
  Filled 2021-12-18: qty 14.29

## 2021-12-18 NOTE — Progress Notes (Signed)
?Hematology/Oncology Progress note ?Telephone:([phone]) [phone] [phone] ?  ? ? ? ?Patient Care Team: ?Kirk Ruths, MD as PCP - General (Internal Medicine) ?Earlie Server, MD as Consulting Physician (Oncology) ? ?REFERRING PROVIDER: ?Kirk Ruths, MD  ?CHIEF COMPLAINTS/REASON FOR VISIT:  ?Follow up for treatment of breast cancer ? ?HISTORY OF PRESENTING ILLNESS:  ? ?Anna Banks is a  46 y.o.  female with PMH listed below was seen in consultation at the request of  Kirk Ruths, MD  for evaluation of breast cancer.  ? ?Patient was accompanied by her half sister today. Per sister, patient has history of seizure since childhood and has intellectual/cognitive impairment. She is illiterate. She follows instructions from her family members.  ?She lives with her mother and her half sister who takes of her and helps her to make medical decision.   ?They waived interpretor. Both patient and her half sister speak [place]. ? ?Menarche 70 years of age. G0P0. ?No child, no previous pregnancy. Per her sister, patient is not sexually active ?No prior use of birth control pills or hormone replacement therapy. ?Denies previous history of biopsy. ? ?02/16/2021, screening mammogram showed calcification in the right breast needs further evaluation.  Left breast possible mass with distortion requires further evaluation. ?02/26/2021, left breast showed a 10 mm indeterminate mass, 2:00, 8 cm from nipple.  No mammographic evidence of malignancy in the right breast.  No left axillary lymphadenopathy. ?03/10/2021, patient underwent ultrasound-guided biopsy of left breast mass.  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  ER low positive [1-10%], PR positive [1-10%], HER2 positive by IHC 3+ ? ?# 04/09/2021, status postlumpectomy and sentinel lymph node biopsy. ?Invasive mammary carcinoma, no special type, 4 lymph nodes all negative for  metastatic carcinoma.  DCIS present.  LVI not identified.Grade 2, all margins negative for invasive carcinoma.pT1c pN0, ER [staining was repeated on surgical specimen] [11-50%]+ PR [1-10%]+, HER 2+ ? ?Baseline echocardiogram showed normal LVEF. ? ? ?07/28/2021, echocardiogram showed LVEF is normal 60 to 65%. ?10/01/2021 She has finished adjuvant radiation of the breast.  ? ?10/15/2021, started on tamoxifen 20 mg daily. ?11/02/2021 echo showed stable and normal LVEF.  ? ?INTERVAL HISTORY ?Anna Banks is a 46 y.o. female who has above history reviewed by me today presents for follow up visit for chemotherapy for HER2 positive breast cancer  ?Patient reports feeling well today.  No new complaints.  She tolerates tamoxifen 20 mg daily.  Denies any hot flash, joint pain. ? ? ? ?Review of Systems  ?Unable to perform ROS: Other (intellectual/Cognitive impairment)  ?Constitutional:  Negative for appetite change, chills, fatigue and fever.  ?HENT:   Negative for hearing loss and voice change.   ?Eyes:  Negative for eye problems.  ?Respiratory:  Negative for chest tightness and cough.   ?Cardiovascular:  Negative for chest pain.  ?Gastrointestinal:  Negative for abdominal distention, abdominal pain, blood in stool, nausea and vomiting.  ?Endocrine: Negative for hot flashes.  ?Genitourinary:  Negative for difficulty urinating and frequency.   ?Musculoskeletal:  Negative for arthralgias.  ?Skin:  Negative for itching and rash.  ?Neurological:  Negative for extremity weakness.  ?Hematological:  Negative for adenopathy.  ?Psychiatric/Behavioral:  Negative for confusion.   ? ?MEDICAL HISTORY:  ?Past Medical History:  ?Diagnosis Date  ? Down syndrome   ? Seizure ([place])   ? ? ?SURGICAL HISTORY: ?Past Surgical History:  ?Procedure Laterality Date  ? BREAST  BIOPSY Left 03/10/2021  ? [place] BX,ribbon clip, IMC  ? BREAST LUMPECTOMY,RADIO FREQ LOCALIZER,AXILLARY SENTINEL LYMPH NODE BIOPSY Left 04/09/2021  ? Procedure: BREAST LUMPECTOMY,RADIO FREQ  LOCALIZER,AXILLARY SENTINEL LYMPH NODE BIOPSY;  Surgeon: Benjamine Sprague, DO;  Location: [facility] ORS;  Service: General;  Laterality: Left;  ? ? ?SOCIAL HISTORY: ?Social History  ? ?Socioeconomic History  ? Marital status: Single  ?  Spouse name: Not on file  ? Number of children: Not on file  ? Years of education: Not on file  ? Highest education level: Not on file  ?Occupational History  ? Not on file  ?Tobacco Use  ? Smoking status: Never  ? Smokeless tobacco: Never  ?Vaping Use  ? Vaping Use: Never used  ?Substance and Sexual Activity  ? Alcohol use: Never  ? Drug use: Not Currently  ? Sexual activity: Not on file  ?Other Topics Concern  ? Not on file  ?Social History Narrative  ? Not on file  ? ?Social Determinants of Health  ? ?Financial Resource Strain: Not on file  ?Food Insecurity: Not on file  ?Transportation Needs: Not on file  ?Physical Activity: Not on file  ?Stress: Not on file  ?Social Connections: Not on file  ?Intimate Partner Violence: Not on file  ? ? ?FAMILY HISTORY: ?History reviewed. No pertinent family history. ? ?ALLERGIES:  has No Known Allergies. ? ?MEDICATIONS:  ?Current Outpatient Medications  ?Medication Sig Dispense Refill  ? acetaminophen (TYLENOL) 325 MG tablet Take 325-650 mg by mouth every 6 (six) hours as needed for moderate pain.    ? ethosuximide (ZARONTIN) 250 MG capsule Take 250 mg by mouth 2 (two) times daily.    ? HYDROcodone-acetaminophen (NORCO) 5-325 MG tablet Take 1 tablet by mouth every 6 (six) hours as needed for up to 6 doses for moderate pain. 6 tablet 0  ? KLOR-CON M20 20 MEQ tablet TAKE 1 TABLET BY MOUTH EVERY DAY 90 tablet 1  ? ondansetron (ZOFRAN) 8 MG tablet Take 1 tablet (8 mg total) by mouth 2 (two) times daily as needed (Nausea or vomiting). 30 tablet 1  ? tamoxifen (NOLVADEX) 20 MG tablet Take 1 tablet (20 mg total) by mouth daily. 30 tablet 3  ? VITAMIN D PO Take 1 capsule by mouth daily.    ? ?No current facility-administered medications for this visit.   ? ? ? ?PHYSICAL EXAMINATION: ?ECOG PERFORMANCE STATUS: 0 - Asymptomatic ?Vitals:  ? 12/18/21 0834  ?BP: 127/64  ?Pulse: 88  ?Resp: 20  ?Temp: 98.7 ?F (37.1 ?C)  ?SpO2: 100%  ? ?Filed Weights  ? 12/18/21 0834  ?Weight: 112 lb 8 oz (51 kg)  ? ? ?Physical Exam ?Constitutional:   ?   General: She is not in acute distress. ?HENT:  ?   Head: Normocephalic and atraumatic.  ?Eyes:  ?   General: No scleral icterus. ?Cardiovascular:  ?   Rate and Rhythm: Regular rhythm.  ?   Heart sounds: Normal heart sounds.  ?Pulmonary:  ?   Effort: Pulmonary effort is normal. No respiratory distress.  ?   Breath sounds: No wheezing.  ?Abdominal:  ?   General: Bowel sounds are normal. There is no distension.  ?   Palpations: Abdomen is soft.  ?Musculoskeletal:     ?   General: No deformity. Normal range of motion.  ?   Cervical back: Normal range of motion and neck supple.  ?Skin: ?   General: Skin is warm and dry.  ?   Findings: No  erythema or rash.  ?Neurological:  ?   Mental Status: She is alert. Mental status is at baseline.  ?   Cranial Nerves: No cranial nerve deficit.  ?   Coordination: Coordination normal.  ?Psychiatric:     ?   Mood and Affect: Mood normal.  ? ?. ? ? ?LABORATORY DATA:  ?I have reviewed the data as listed ?Lab Results  ?Component Value Date  ? WBC 4.1 12/18/2021  ? HGB 11.7 (L) 12/18/2021  ? HCT 35.6 (L) 12/18/2021  ? MCV 93.7 12/18/2021  ? PLT 321 12/18/2021  ? ?Recent Labs  ?  11/04/21 ?7530 11/27/21 ?0511 12/18/21 ?0211  ?NA 137 139 136  ?K 3.7 3.7 3.7  ?CL 103 105 104  ?CO2 _0 ?GLUCOSE 118* 137* 120*  ?BUN 19 23* 14  ?CREATININE 0.66 0.71 0.75  ?CALCIUM 10.3 9.6 9.4  ?GFRNONAA >60 >60 >60  ?PROT 8.0 7.1 7.2  ?ALBUMIN 4.3 3.8 3.8  ?AST _1 ?ALT _2 ?ALKPHOS 55 48 46  ?BILITOT 0.6 0.4 0.5  ? ? ?Iron/TIBC/Ferritin/ %Sat ?No results found for: IRON, TIBC, FERRITIN, IRONPCTSAT  ? ? ?RADIOGRAPHIC STUDIES: ?I have personally reviewed the radiological images as listed and agreed with the findings in  the report. ?No results found. ? ? ? ?ASSESSMENT & PLAN:  ?1. Invasive carcinoma of breast ([place])   ?2. Encounter for antineoplastic chemotherapy   ?3. Use of tamoxifen (Nolvadex)   ? Cancer Staging  ?Invasive carc

## 2021-12-18 NOTE — Patient Instructions (Signed)
[facility] CANCER CTR AT [place]  Discharge Instructions: ?Thank you for choosing [facility] to provide your oncology and hematology care.  ?If you have a lab appointment with the [facility], please go directly to the [facility] and check in at the registration area. ? ?Wear comfortable clothing and clothing appropriate for easy access to any Portacath or PICC line.  ? ?We strive to give you quality time with your provider. You may need to reschedule your appointment if you arrive late (15 or more minutes).  Arriving late affects you and other patients whose appointments are after yours.  Also, if you miss three or more appointments without notifying the office, you may be dismissed from the clinic at the provider?s discretion.    ?  ?For prescription refill requests, have your pharmacy contact our office and allow 72 hours for refills to be completed.   ? ?Today you received the following chemotherapy and/or immunotherapy agents ogiviri ?    ?  ?To help prevent nausea and vomiting after your treatment, we encourage you to take your nausea medication as directed. ? ?BELOW ARE SYMPTOMS THAT SHOULD BE REPORTED IMMEDIATELY: ?*FEVER GREATER THAN 100.4 F (38 ?C) OR HIGHER ?*CHILLS OR SWEATING ?*NAUSEA AND VOMITING THAT IS NOT CONTROLLED WITH YOUR NAUSEA MEDICATION ?*UNUSUAL SHORTNESS OF BREATH ?*UNUSUAL BRUISING OR BLEEDING ?*URINARY PROBLEMS (pain or burning when urinating, or frequent urination) ?*BOWEL PROBLEMS (unusual diarrhea, constipation, pain near the anus) ?TENDERNESS IN MOUTH AND THROAT WITH OR WITHOUT PRESENCE OF ULCERS (sore throat, sores in mouth, or a toothache) ?UNUSUAL RASH, SWELLING OR PAIN  ?UNUSUAL VAGINAL DISCHARGE OR ITCHING  ? ?Items with * indicate a potential emergency and should be followed up as soon as possible or go to the Emergency Department if any problems should occur. ? ?Please show the CHEMOTHERAPY ALERT CARD or IMMUNOTHERAPY ALERT CARD at check-in to  the Emergency Department and triage nurse. ? ?Should you have questions after your visit or need to cancel or reschedule your appointment, please contact [facility] CANCER [place] AT [place]  [phone] and follow the prompts.  Office hours are 8:00 a.m. to 4:30 p.m. Monday - Friday. Please note that voicemails left after 4:00 p.m. may not be returned until the following business day.  We are closed weekends and major holidays. You have access to a nurse at all times for urgent questions. Please call the main number to the clinic [phone] and follow the prompts. ? ?For any non-urgent questions, you may also contact your provider using MyChart. We now offer e-Visits for anyone 93 and older to request care online for non-urgent symptoms. For details visit [email or web address]. ?  ?Also download the MyChart app! Go to the app store, search "MyChart", open the app, select [facility], and log in with your MyChart username and password. ? ?Due to Covid, a mask is required upon entering the hospital/clinic. If you do not have a mask, one will be given to you upon arrival. For doctor visits, patients may have 1 support person aged 52 or older with them. For treatment visits, patients cannot have anyone with them due to current Covid guidelines and our immunocompromised population.  ? ?Trastuzumab injection for infusion ?What is this medication? ?TRASTUZUMAB (tras TOO zoo mab) is a monoclonal antibody. It is used to treat breast cancer and stomach cancer. ?This medicine may be used for other purposes; ask your health care provider or pharmacist if you have questions. ?COMMON BRAND NAME(S): Herceptin, Belenda Cruise, Ogivri, Ontruzant,  Trazimera ?What should I tell my care team before I take this medication? ?They need to know if you have any of these conditions: ?heart disease ?heart failure ?lung or breathing disease, like asthma ?an unusual or allergic reaction to trastuzumab, benzyl alcohol, or other  medications, foods, dyes, or preservatives ?pregnant or trying to get pregnant ?breast-feeding ?How should I use this medication? ?This drug is given as an infusion into a vein. It is administered in a hospital or clinic by a specially trained health care professional. ?Talk to your pediatrician regarding the use of this medicine in children. This medicine is not approved for use in children. ?Overdosage: If you think you have taken too much of this medicine contact a poison control center or emergency room at once. ?NOTE: This medicine is only for you. Do not share this medicine with others. ?What if I miss a dose? ?It is important not to miss a dose. Call your doctor or health care professional if you are unable to keep an appointment. ?What may interact with this medication? ?This medicine may interact with the following medications: ?certain types of chemotherapy, such as daunorubicin, doxorubicin, epirubicin, and idarubicin ?This list may not describe all possible interactions. Give your health care provider a list of all the medicines, herbs, non-prescription drugs, or dietary supplements you use. Also tell them if you smoke, drink alcohol, or use illegal drugs. Some items may interact with your medicine. ?What should I watch for while using this medication? ?Visit your doctor for checks on your progress. Report any side effects. Continue your course of treatment even though you feel ill unless your doctor tells you to stop. ?Call your doctor or health care professional for advice if you get a fever, chills or sore throat, or other symptoms of a cold or flu. Do not treat yourself. Try to avoid being around people who are sick. ?You may experience fever, chills and shaking during your first infusion. These effects are usually mild and can be treated with other medicines. Report any side effects during the infusion to your health care professional. Fever and chills usually do not happen with later infusions. ?Do  not become pregnant while taking this medicine or for 7 months after stopping it. Women should inform their doctor if they wish to become pregnant or think they might be pregnant. Women of child-bearing potential will need to have a negative pregnancy test before starting this medicine. There is a potential for serious side effects to an unborn child. Talk to your health care professional or pharmacist for more information. Do not breast-feed an infant while taking this medicine or for 7 months after stopping it. ?Women must use effective birth control with this medicine. ?What side effects may I notice from receiving this medication? ?Side effects that you should report to your doctor or health care professional as soon as possible: ?allergic reactions like skin rash, itching or hives, swelling of the face, lips, or tongue ?chest pain or palpitations ?cough ?dizziness ?feeling faint or lightheaded, falls ?fever ?general ill feeling or flu-like symptoms ?signs of worsening heart failure like breathing problems; swelling in your legs and feet ?unusually weak or tired ?Side effects that usually do not require medical attention (report to your doctor or health care professional if they continue or are bothersome): ?bone pain ?changes in taste ?diarrhea ?joint pain ?nausea/vomiting ?weight loss ?This list may not describe all possible side effects. Call your doctor for medical advice about side effects. You may report side effects  to FDA at 1-800-FDA-1088. ?Where should I keep my medication? ?This drug is given in a hospital or clinic and will not be stored at home. ?NOTE: This sheet is a summary. It may not cover all possible information. If you have questions about this medicine, talk to your doctor, pharmacist, or health care provider. ?? 2022 Elsevier/Gold Standard (2016-09-14 00:00:00) ? ?

## 2022-01-08 ENCOUNTER — Encounter: Payer: Self-pay | Admitting: Oncology

## 2022-01-08 ENCOUNTER — Inpatient Hospital Stay (HOSPITAL_BASED_OUTPATIENT_CLINIC_OR_DEPARTMENT_OTHER): Payer: Medicare Other | Admitting: Oncology

## 2022-01-08 ENCOUNTER — Inpatient Hospital Stay: Payer: Medicare Other

## 2022-01-08 ENCOUNTER — Other Ambulatory Visit: Payer: Self-pay

## 2022-01-08 VITALS — BP 127/80 | HR 82 | Temp 98.8°F | Ht 62.0 in | Wt 113.0 lb

## 2022-01-08 DIAGNOSIS — Z1231 Encounter for screening mammogram for malignant neoplasm of breast: Secondary | ICD-10-CM | POA: Diagnosis not present

## 2022-01-08 DIAGNOSIS — C50919 Malignant neoplasm of unspecified site of unspecified female breast: Secondary | ICD-10-CM

## 2022-01-08 DIAGNOSIS — Z79810 Long term (current) use of selective estrogen receptor modulators (SERMs): Secondary | ICD-10-CM | POA: Diagnosis not present

## 2022-01-08 DIAGNOSIS — Z853 Personal history of malignant neoplasm of breast: Secondary | ICD-10-CM

## 2022-01-08 DIAGNOSIS — Z5112 Encounter for antineoplastic immunotherapy: Secondary | ICD-10-CM | POA: Diagnosis not present

## 2022-01-08 LAB — CBC WITH DIFFERENTIAL/PLATELET
Abs Immature Granulocytes: 0.02 10*3/uL (ref 0.00–0.07)
Basophils Absolute: 0 10*3/uL (ref 0.0–0.1)
Basophils Relative: 0 %
Eosinophils Absolute: 0.2 10*3/uL (ref 0.0–0.5)
Eosinophils Relative: 3 %
HCT: 36.9 % (ref 36.0–46.0)
Hemoglobin: 12.1 g/dL (ref 12.0–15.0)
Immature Granulocytes: 0 %
Lymphocytes Relative: 28 %
Lymphs Abs: 1.6 10*3/uL (ref 0.7–4.0)
MCH: 30.4 pg (ref 26.0–34.0)
MCHC: 32.8 g/dL (ref 30.0–36.0)
MCV: 92.7 fL (ref 80.0–100.0)
Monocytes Absolute: 0.4 10*3/uL (ref 0.1–1.0)
Monocytes Relative: 7 %
Neutro Abs: 3.6 10*3/uL (ref 1.7–7.7)
Neutrophils Relative %: 62 %
Platelets: 314 10*3/uL (ref 150–400)
RBC: 3.98 MIL/uL (ref 3.87–5.11)
RDW: 12.9 % (ref 11.5–15.5)
WBC: 5.9 10*3/uL (ref 4.0–10.5)
nRBC: 0 % (ref 0.0–0.2)

## 2022-01-08 LAB — COMPREHENSIVE METABOLIC PANEL
ALT: 17 U/L (ref 0–44)
AST: 28 U/L (ref 15–41)
Albumin: 3.9 g/dL (ref 3.5–5.0)
Alkaline Phosphatase: 46 U/L (ref 38–126)
Anion gap: 4 — ABNORMAL LOW (ref 5–15)
BUN: 16 mg/dL (ref 6–20)
CO2: 23 mmol/L (ref 22–32)
Calcium: 9.1 mg/dL (ref 8.9–10.3)
Chloride: 106 mmol/L (ref 98–111)
Creatinine, Ser: 0.65 mg/dL (ref 0.44–1.00)
GFR, Estimated: >60 mL/min (ref >60)
Glucose, Bld: 107 mg/dL — ABNORMAL HIGH (ref 70–99)
Potassium: 3.8 mmol/L (ref 3.5–5.1)
Sodium: 133 mmol/L — ABNORMAL LOW (ref 135–145)
Total Bilirubin: 0.4 mg/dL (ref 0.3–1.2)
Total Protein: 7.8 g/dL (ref 6.5–8.1)

## 2022-01-09 ENCOUNTER — Encounter: Payer: Self-pay | Admitting: Oncology

## 2022-01-09 NOTE — Progress Notes (Addendum)
?Hematology/Oncology Progress note ?Telephone:(336) [phone] Fax:([phone] ?  ? ? ? ?Patient Care Team: ?Kirk Ruths, MD as PCP - General (Internal Medicine) ?Earlie Server, MD as Consulting Physician (Oncology) ? ?REFERRING PROVIDER: ?Kirk Ruths, MD  ?CHIEF COMPLAINTS/REASON FOR VISIT:  ?Follow up for treatment of breast cancer ? ?HISTORY OF PRESENTING ILLNESS:  ? ?Anna Banks is a  46 y.o.  female with PMH listed below was seen in consultation at the request of  Kirk Ruths, MD  for evaluation of breast cancer.  ? ?Patient was accompanied by her half sister today. Per sister, patient has history of seizure since childhood and has intellectual/cognitive impairment. She is illiterate. She follows instructions from her family members.  ?She lives with her mother and her half sister who takes of her and helps her to make medical decision.   ?They waived interpretor. Both patient and her half sister speak [place]. ? ?Menarche 41 years of age. G0P0. ?No child, no previous pregnancy. Per her sister, patient is not sexually active ?No prior use of birth control pills or hormone replacement therapy. ?Denies previous history of biopsy. ? ?02/16/2021, screening mammogram showed calcification in the right breast needs further evaluation.  Left breast possible mass with distortion requires further evaluation. ?02/26/2021, left breast showed a 10 mm indeterminate mass, 2:00, 8 cm from nipple.  No mammographic evidence of malignancy in the right breast.  No left axillary lymphadenopathy. ?03/10/2021, patient underwent ultrasound-guided biopsy of left breast mass.  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  ER low positive [1-10%], PR positive [1-10%], HER2 positive by IHC 3+ ? ?# 04/09/2021, status postlumpectomy and sentinel lymph node biopsy. ?Invasive mammary carcinoma, no special type, 4 lymph nodes all negative for  metastatic carcinoma.  DCIS present.  LVI not identified.Grade 2, all margins negative for invasive carcinoma.pT1c pN0, ER [staining was repeated on surgical specimen] [11-50%]+ PR [1-10%]+, HER 2+ ? ?Baseline echocardiogram showed normal LVEF. ? ? ?07/28/2021, echocardiogram showed LVEF is normal 60 to 65%. ?10/01/2021 She has finished adjuvant radiation of the breast.  ? ?10/15/2021, started on tamoxifen 20 mg daily. ?11/02/2021 echo showed stable and normal LVEF.  ? ?INTERVAL HISTORY ?Anna dung Anna Banks is a 46 y.o. female who has above history reviewed by me today presents for follow up visit for chemotherapy for HER2 positive breast cancer  ?Patient reports feeling well today ?She denies any shortness of breath, hot flash, joint pain.  Tolerates tamoxifen 20 mg daily ? ? ?Review of Systems  ?Unable to perform ROS: Other (intellectual/Cognitive impairment)  ?Constitutional:  Negative for appetite change, chills, fatigue and fever.  ?HENT:   Negative for hearing loss and voice change.   ?Eyes:  Negative for eye problems.  ?Respiratory:  Negative for chest tightness and cough.   ?Cardiovascular:  Negative for chest pain.  ?Gastrointestinal:  Negative for abdominal distention, abdominal pain, blood in stool, nausea and vomiting.  ?Endocrine: Negative for hot flashes.  ?Genitourinary:  Negative for difficulty urinating and frequency.   ?Musculoskeletal:  Negative for arthralgias.  ?Skin:  Negative for itching and rash.  ?Neurological:  Negative for extremity weakness.  ?Hematological:  Negative for adenopathy.  ?Psychiatric/Behavioral:  Negative for confusion.   ? ?MEDICAL HISTORY:  ?Past Medical History:  ?Diagnosis Date  ? Down syndrome   ? Seizure ([place])   ? ? ?SURGICAL HISTORY: ?Past Surgical History:  ?Procedure Laterality Date  ? BREAST BIOPSY Left 03/10/2021  ?  [place] BX,ribbon clip, IMC  ? BREAST LUMPECTOMY,RADIO FREQ LOCALIZER,AXILLARY SENTINEL LYMPH NODE BIOPSY Left 04/09/2021  ? Procedure: BREAST LUMPECTOMY,RADIO FREQ  LOCALIZER,AXILLARY SENTINEL LYMPH NODE BIOPSY;  Surgeon: Benjamine Sprague, DO;  Location: [facility] ORS;  Service: General;  Laterality: Left;  ? ? ?SOCIAL HISTORY: ?Social History  ? ?Socioeconomic History  ? Marital status: Single  ?  Spouse name: Not on file  ? Number of children: Not on file  ? Years of education: Not on file  ? Highest education level: Not on file  ?Occupational History  ? Not on file  ?Tobacco Use  ? Smoking status: Never  ? Smokeless tobacco: Never  ?Vaping Use  ? Vaping Use: Never used  ?Substance and Sexual Activity  ? Alcohol use: Never  ? Drug use: Not Currently  ? Sexual activity: Not on file  ?Other Topics Concern  ? Not on file  ?Social History Narrative  ? Not on file  ? ?Social Determinants of Health  ? ?Financial Resource Strain: Not on file  ?Food Insecurity: Not on file  ?Transportation Needs: Not on file  ?Physical Activity: Not on file  ?Stress: Not on file  ?Social Connections: Not on file  ?Intimate Partner Violence: Not on file  ? ? ?FAMILY HISTORY: ?History reviewed. No pertinent family history. ? ?ALLERGIES:  has No Known Allergies. ? ?MEDICATIONS:  ?Current Outpatient Medications  ?Medication Sig Dispense Refill  ? acetaminophen (TYLENOL) 325 MG tablet Take 325-650 mg by mouth every 6 (six) hours as needed for moderate pain.    ? ethosuximide (ZARONTIN) 250 MG capsule Take 250 mg by mouth 2 (two) times daily.    ? HYDROcodone-acetaminophen (NORCO) 5-325 MG tablet Take 1 tablet by mouth every 6 (six) hours as needed for up to 6 doses for moderate pain. 6 tablet 0  ? KLOR-CON M20 20 MEQ tablet TAKE 1 TABLET BY MOUTH EVERY DAY 90 tablet 1  ? ondansetron (ZOFRAN) 8 MG tablet Take 1 tablet (8 mg total) by mouth 2 (two) times daily as needed (Nausea or vomiting). 30 tablet 1  ? tamoxifen (NOLVADEX) 20 MG tablet Take 1 tablet (20 mg total) by mouth daily. 30 tablet 3  ? VITAMIN D PO Take 1 capsule by mouth daily.    ? ?No current facility-administered medications for this visit.   ? ? ? ?PHYSICAL EXAMINATION: ?ECOG PERFORMANCE STATUS: 0 - Asymptomatic ?Vitals:  ? 01/08/22 1039  ?BP: 127/80  ?Pulse: 82  ?Temp: 98.8 ?F (37.1 ?C)  ? ?Filed Weights  ? 01/08/22 1039  ?Weight: 113 lb (51.3 kg)  ? ? ?Physical Exam ?Constitutional:   ?   General: She is not in acute distress. ?HENT:  ?   Head: Normocephalic and atraumatic.  ?Eyes:  ?   General: No scleral icterus. ?Cardiovascular:  ?   Rate and Rhythm: Regular rhythm.  ?   Heart sounds: Normal heart sounds.  ?Pulmonary:  ?   Effort: Pulmonary effort is normal. No respiratory distress.  ?   Breath sounds: No wheezing.  ?Abdominal:  ?   General: Bowel sounds are normal. There is no distension.  ?   Palpations: Abdomen is soft.  ?Musculoskeletal:     ?   General: No deformity. Normal range of motion.  ?   Cervical back: Normal range of motion and neck supple.  ?Skin: ?   General: Skin is warm and dry.  ?   Findings: No erythema or rash.  ?Neurological:  ?   Mental Status: She is  alert. Mental status is at baseline.  ?   Cranial Nerves: No cranial nerve deficit.  ?   Coordination: Coordination normal.  ?Psychiatric:     ?   Mood and Affect: Mood normal.  ? ?. ? ? ?LABORATORY DATA:  ?I have reviewed the data as listed ?Lab Results  ?Component Value Date  ? WBC 5.9 01/08/2022  ? HGB 12.1 01/08/2022  ? HCT 36.9 01/08/2022  ? MCV 92.7 01/08/2022  ? PLT 314 01/08/2022  ? ?Recent Labs  ?  11/27/21 ?0819 12/18/21 ?0824 01/08/22 ?1026  ?NA 139 136 133*  ?K 3.7 3.7 3.8  ?CL 105 104 106  ?CO2 24 25 23   ?GLUCOSE 137* 120* 107*  ?BUN 23* 14 16  ?CREATININE 0.71 0.75 0.65  ?CALCIUM 9.6 9.4 9.1  ?GFRNONAA >60 >60 >60  ?PROT 7.1 7.2 7.8  ?ALBUMIN 3.8 3.8 3.9  ?AST 30 28 28   ?ALT 17 14 17   ?ALKPHOS 48 46 46  ?BILITOT 0.4 0.5 0.4  ? ? ?Iron/TIBC/Ferritin/ %Sat ?No results found for: IRON, TIBC, FERRITIN, IRONPCTSAT  ? ? ?RADIOGRAPHIC STUDIES: ?I have personally reviewed the radiological images as listed and agreed with the findings in the report. ?No results  found. ? ? ? ?ASSESSMENT & PLAN:  ?1. Invasive carcinoma of breast ([place])   ?2. Malignant neoplasm of female breast, unspecified estrogen receptor status, unspecified laterality, unspecified site of breast ([place])   ?3. Screening

## 2022-01-15 ENCOUNTER — Inpatient Hospital Stay: Payer: Medicare Other

## 2022-01-15 ENCOUNTER — Inpatient Hospital Stay: Payer: Medicare Other | Attending: Oncology

## 2022-01-15 VITALS — BP 129/74 | HR 71 | Temp 96.9°F | Resp 18 | Wt 114.2 lb

## 2022-01-15 DIAGNOSIS — Z170 Estrogen receptor positive status [ER+]: Secondary | ICD-10-CM | POA: Insufficient documentation

## 2022-01-15 DIAGNOSIS — C50919 Malignant neoplasm of unspecified site of unspecified female breast: Secondary | ICD-10-CM

## 2022-01-15 DIAGNOSIS — C50412 Malignant neoplasm of upper-outer quadrant of left female breast: Secondary | ICD-10-CM | POA: Diagnosis present

## 2022-01-15 DIAGNOSIS — Z5112 Encounter for antineoplastic immunotherapy: Secondary | ICD-10-CM | POA: Diagnosis present

## 2022-01-15 LAB — PREGNANCY, URINE: Preg Test, Ur: NEGATIVE

## 2022-01-15 MED ORDER — ACETAMINOPHEN 325 MG PO TABS
650.0000 mg | ORAL_TABLET | Freq: Once | ORAL | Status: AC
  Administered 2022-01-15: 650 mg via ORAL
  Filled 2022-01-15: qty 2

## 2022-01-15 MED ORDER — DIPHENHYDRAMINE HCL 25 MG PO CAPS
50.0000 mg | ORAL_CAPSULE | Freq: Once | ORAL | Status: AC
  Administered 2022-01-15: 50 mg via ORAL
  Filled 2022-01-15: qty 2

## 2022-01-15 MED ORDER — SODIUM CHLORIDE 0.9 % IV SOLN
Freq: Once | INTRAVENOUS | Status: AC
  Filled 2022-01-15: qty 250

## 2022-01-15 MED ORDER — TRASTUZUMAB-DKST CHEMO 150 MG IV SOLR
300.0000 mg | Freq: Once | INTRAVENOUS | Status: AC
  Administered 2022-01-15: 300 mg via INTRAVENOUS
  Filled 2022-01-15: qty 14.29

## 2022-01-15 NOTE — Patient Instructions (Addendum)
[facility] CANCER CTR AT [place]  Discharge Instructions: ?Thank you for choosing [facility] to provide your oncology and hematology care.  ?If you have a lab appointment with the [facility], please go directly to the [facility] and check in at the registration area. ? ?Wear comfortable clothing and clothing appropriate for easy access to any Portacath or PICC line.  ? ?We strive to give you quality time with your provider. You may need to reschedule your appointment if you arrive late (15 or more minutes).  Arriving late affects you and other patients whose appointments are after yours.  Also, if you miss three or more appointments without notifying the office, you may be dismissed from the clinic at the provider?s discretion.    ?  ?For prescription refill requests, have your pharmacy contact our office and allow 72 hours for refills to be completed.   ? ?Today you received the following chemotherapy and/or immunotherapy agents: Ogivri     ?  ?To help prevent nausea and vomiting after your treatment, we encourage you to take your nausea medication as directed. ? ?BELOW ARE SYMPTOMS THAT SHOULD BE REPORTED IMMEDIATELY: ?*FEVER GREATER THAN 100.4 F (38 ?C) OR HIGHER ?*CHILLS OR SWEATING ?*NAUSEA AND VOMITING THAT IS NOT CONTROLLED WITH YOUR NAUSEA MEDICATION ?*UNUSUAL SHORTNESS OF BREATH ?*UNUSUAL BRUISING OR BLEEDING ?*URINARY PROBLEMS (pain or burning when urinating, or frequent urination) ?*BOWEL PROBLEMS (unusual diarrhea, constipation, pain near the anus) ?TENDERNESS IN MOUTH AND THROAT WITH OR WITHOUT PRESENCE OF ULCERS (sore throat, sores in mouth, or a toothache) ?UNUSUAL RASH, SWELLING OR PAIN  ?UNUSUAL VAGINAL DISCHARGE OR ITCHING  ? ?Items with * indicate a potential emergency and should be followed up as soon as possible or go to the Emergency Department if any problems should occur. ? ?Please show the CHEMOTHERAPY ALERT CARD or IMMUNOTHERAPY ALERT CARD at check-in to  the Emergency Department and triage nurse. ? ?Should you have questions after your visit or need to cancel or reschedule your appointment, please contact [facility] CANCER [place] AT [place]  [phone] and follow the prompts.  Office hours are 8:00 a.m. to 4:30 p.m. Monday - Friday. Please note that voicemails left after 4:00 p.m. may not be returned until the following business day.  We are closed weekends and major holidays. You have access to a nurse at all times for urgent questions. Please call the main number to the clinic [phone] and follow the prompts. ? ?For any non-urgent questions, you may also contact your provider using MyChart. We now offer e-Visits for anyone 16 and older to request care online for non-urgent symptoms. For details visit [email or web address]. ?  ?Also download the MyChart app! Go to the app store, search "MyChart", open the app, select [facility], and log in with your MyChart username and password. ? ?Due to Covid, a mask is required upon entering the hospital/clinic. If you do not have a mask, one will be given to you upon arrival. For doctor visits, patients may have 1 support person aged 71 or older with them. For treatment visits, patients cannot have anyone with them due to current Covid guidelines and our immunocompromised population.  ?

## 2022-01-29 ENCOUNTER — Other Ambulatory Visit: Payer: Medicare Other

## 2022-01-29 ENCOUNTER — Ambulatory Visit: Payer: Medicare Other

## 2022-01-29 ENCOUNTER — Ambulatory Visit: Payer: Medicare Other | Admitting: Oncology

## 2022-02-05 ENCOUNTER — Encounter: Payer: Self-pay | Admitting: Oncology

## 2022-02-05 ENCOUNTER — Inpatient Hospital Stay (HOSPITAL_BASED_OUTPATIENT_CLINIC_OR_DEPARTMENT_OTHER): Payer: Medicare Other | Admitting: Oncology

## 2022-02-05 ENCOUNTER — Inpatient Hospital Stay: Payer: Medicare Other

## 2022-02-05 VITALS — BP 107/68 | HR 80 | Temp 97.4°F | Resp 16 | Ht 62.0 in | Wt 112.5 lb

## 2022-02-05 DIAGNOSIS — C50919 Malignant neoplasm of unspecified site of unspecified female breast: Secondary | ICD-10-CM

## 2022-02-05 DIAGNOSIS — Z5112 Encounter for antineoplastic immunotherapy: Secondary | ICD-10-CM | POA: Diagnosis not present

## 2022-02-05 DIAGNOSIS — Z79899 Other long term (current) drug therapy: Secondary | ICD-10-CM | POA: Diagnosis not present

## 2022-02-05 DIAGNOSIS — Z5111 Encounter for antineoplastic chemotherapy: Secondary | ICD-10-CM | POA: Diagnosis not present

## 2022-02-05 DIAGNOSIS — Z5181 Encounter for therapeutic drug level monitoring: Secondary | ICD-10-CM

## 2022-02-05 LAB — COMPREHENSIVE METABOLIC PANEL
ALT: 14 U/L (ref 0–44)
AST: 26 U/L (ref 15–41)
Albumin: 4 g/dL (ref 3.5–5.0)
Alkaline Phosphatase: 50 U/L (ref 38–126)
Anion gap: 8 (ref 5–15)
BUN: 16 mg/dL (ref 6–20)
CO2: 24 mmol/L (ref 22–32)
Calcium: 9.4 mg/dL (ref 8.9–10.3)
Chloride: 105 mmol/L (ref 98–111)
Creatinine, Ser: 0.75 mg/dL (ref 0.44–1.00)
GFR, Estimated: >60 mL/min (ref >60)
Glucose, Bld: 128 mg/dL — ABNORMAL HIGH (ref 70–99)
Potassium: 3.6 mmol/L (ref 3.5–5.1)
Sodium: 137 mmol/L (ref 135–145)
Total Bilirubin: 0.3 mg/dL (ref 0.3–1.2)
Total Protein: 8 g/dL (ref 6.5–8.1)

## 2022-02-05 LAB — CBC WITH DIFFERENTIAL/PLATELET
Abs Immature Granulocytes: 0.01 10*3/uL (ref 0.00–0.07)
Basophils Absolute: 0 10*3/uL (ref 0.0–0.1)
Basophils Relative: 1 %
Eosinophils Absolute: 0.4 10*3/uL (ref 0.0–0.5)
Eosinophils Relative: 7 %
HCT: 38.5 % (ref 36.0–46.0)
Hemoglobin: 12.7 g/dL (ref 12.0–15.0)
Immature Granulocytes: 0 %
Lymphocytes Relative: 31 %
Lymphs Abs: 1.6 10*3/uL (ref 0.7–4.0)
MCH: 30.8 pg (ref 26.0–34.0)
MCHC: 33 g/dL (ref 30.0–36.0)
MCV: 93.4 fL (ref 80.0–100.0)
Monocytes Absolute: 0.5 10*3/uL (ref 0.1–1.0)
Monocytes Relative: 8 %
Neutro Abs: 2.8 10*3/uL (ref 1.7–7.7)
Neutrophils Relative %: 53 %
Platelets: 328 10*3/uL (ref 150–400)
RBC: 4.12 MIL/uL (ref 3.87–5.11)
RDW: 12.4 % (ref 11.5–15.5)
WBC: 5.3 10*3/uL (ref 4.0–10.5)
nRBC: 0 % (ref 0.0–0.2)

## 2022-02-05 LAB — PREGNANCY, URINE: Preg Test, Ur: NEGATIVE

## 2022-02-05 MED ORDER — DIPHENHYDRAMINE HCL 25 MG PO CAPS
50.0000 mg | ORAL_CAPSULE | Freq: Once | ORAL | Status: AC
  Administered 2022-02-05: 50 mg via ORAL
  Filled 2022-02-05: qty 2

## 2022-02-05 MED ORDER — TRASTUZUMAB-DKST CHEMO 150 MG IV SOLR
300.0000 mg | Freq: Once | INTRAVENOUS | Status: AC
  Administered 2022-02-05: 300 mg via INTRAVENOUS
  Filled 2022-02-05: qty 14.29

## 2022-02-05 MED ORDER — SODIUM CHLORIDE 0.9 % IV SOLN
Freq: Once | INTRAVENOUS | Status: AC
  Filled 2022-02-05: qty 250

## 2022-02-05 MED ORDER — ACETAMINOPHEN 325 MG PO TABS
650.0000 mg | ORAL_TABLET | Freq: Once | ORAL | Status: AC
  Administered 2022-02-05: 650 mg via ORAL
  Filled 2022-02-05: qty 2

## 2022-02-05 NOTE — Progress Notes (Signed)
Hematology/Oncology Progress note [phone] [phone]      Patient Care Team: Kirk Ruths, MD as PCP - General (Internal Medicine) Earlie Server, MD as Consulting Physician (Oncology)  REFERRING PROVIDER: Kirk Ruths, MD  CHIEF COMPLAINTS/REASON FOR VISIT:  Follow up for treatment of breast cancer  HISTORY OF PRESENTING ILLNESS:   Anna Banks is a  46 y.o.  female with PMH listed below was seen in consultation at the request of  Kirk Ruths, MD  for evaluation of breast cancer.   Patient was accompanied by her half sister today. Per sister, patient has history of seizure since childhood and has intellectual/cognitive impairment. She is illiterate. She follows instructions from her family members.  She lives with her mother and her half sister who takes of her and helps her to make medical decision.   They waived interpretor. Both patient and her half sister speak [place].  Menarche 58 years of age. G0P0. No child, no previous pregnancy. Per her sister, patient is not sexually active No prior use of birth control pills or hormone replacement therapy. Denies previous history of biopsy.  02/16/2021, screening mammogram showed calcification in the right breast needs further evaluation.  Left breast possible mass with distortion requires further evaluation. 02/26/2021, left breast showed a 10 mm indeterminate mass, 2:00, 8 cm from nipple.  No mammographic evidence of malignancy in the right breast.  No left axillary lymphadenopathy. 03/10/2021, patient underwent ultrasound-guided biopsy of left breast mass.  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  ER low positive [1-10%], PR positive [1-10%], HER2 positive by IHC 3+  # 04/09/2021, status postlumpectomy and sentinel lymph node biopsy. Invasive mammary carcinoma, no special type, 4 lymph nodes all negative for  metastatic carcinoma.  DCIS present.  LVI not identified.Grade 2, all margins negative for invasive carcinoma.pT1c pN0, ER [staining was repeated on surgical specimen] [11-50%]+ PR [1-10%]+, HER 2+  Baseline echocardiogram showed normal LVEF.   07/28/2021, echocardiogram showed LVEF is normal 60 to 65%. 10/01/2021 She has finished adjuvant radiation of the breast.   10/15/2021, started on tamoxifen 20 mg daily. 11/02/2021 echo showed stable and normal LVEF.   INTERVAL HISTORY Anna Banks is a 46 y.o. female who has above history reviewed by me today presents for follow up visit for chemotherapy for HER2 positive breast cancer  Patient reports feeling well today.  Denies any nausea vomiting diarrhea, shortness of breath, leg swelling, joint pain.  Patient takes tamoxifen 20 mg daily.  Tolerates well.   Review of Systems  Unable to perform ROS: Other (intellectual/Cognitive impairment)  Constitutional:  Negative for appetite change, chills, fatigue and fever.  HENT:   Negative for hearing loss and voice change.   Eyes:  Negative for eye problems.  Respiratory:  Negative for chest tightness and cough.   Cardiovascular:  Negative for chest pain.  Gastrointestinal:  Negative for abdominal distention, abdominal pain, blood in stool, nausea and vomiting.  Endocrine: Negative for hot flashes.  Genitourinary:  Negative for difficulty urinating and frequency.   Musculoskeletal:  Negative for arthralgias.  Skin:  Negative for itching and rash.  Neurological:  Negative for extremity weakness.  Hematological:  Negative for adenopathy.  Psychiatric/Behavioral:  Negative for confusion.    MEDICAL HISTORY:  Past Medical History:  Diagnosis Date   Down syndrome    Seizure [facility])     SURGICAL HISTORY: Past Surgical History:  Procedure Laterality  Date   BREAST BIOPSY Left 03/10/2021   [place] BX,ribbon clip, IMC   BREAST LUMPECTOMY,RADIO FREQ LOCALIZER,AXILLARY SENTINEL LYMPH NODE BIOPSY Left  04/09/2021   Procedure: BREAST LUMPECTOMY,RADIO FREQ LOCALIZER,AXILLARY SENTINEL LYMPH NODE BIOPSY;  Surgeon: Benjamine Sprague, DO;  Location: [facility] ORS;  Service: General;  Laterality: Left;    SOCIAL HISTORY: Social History   Socioeconomic History   Marital status: Single    Spouse name: Not on file   Number of children: Not on file   Years of education: Not on file   Highest education level: Not on file  Occupational History   Not on file  Tobacco Use   Smoking status: Never   Smokeless tobacco: Never  Vaping Use   Vaping Use: Never used  Substance and Sexual Activity   Alcohol use: Never   Drug use: Not Currently   Sexual activity: Not on file  Other Topics Concern   Not on file  Social History Narrative   Not on file   Social Determinants of Health   Financial Resource Strain: Not on file  Food Insecurity: Not on file  Transportation Needs: Not on file  Physical Activity: Not on file  Stress: Not on file  Social Connections: Not on file  Intimate Partner Violence: Not on file    FAMILY HISTORY: History reviewed. No pertinent family history.  ALLERGIES:  has No Known Allergies.  MEDICATIONS:  Current Outpatient Medications  Medication Sig Dispense Refill   acetaminophen (TYLENOL) 325 MG tablet Take 325-650 mg by mouth every 6 (six) hours as needed for moderate pain.     ethosuximide (ZARONTIN) 250 MG capsule Take 250 mg by mouth 2 (two) times daily.     HYDROcodone-acetaminophen (NORCO) 5-325 MG tablet Take 1 tablet by mouth every 6 (six) hours as needed for up to 6 doses for moderate pain. 6 tablet 0   KLOR-CON M20 20 MEQ tablet TAKE 1 TABLET BY MOUTH EVERY DAY 90 tablet 1   omeprazole (PRILOSEC) 20 MG capsule Take 1 capsule by mouth daily.     ondansetron (ZOFRAN) 8 MG tablet Take 1 tablet (8 mg total) by mouth 2 (two) times daily as needed (Nausea or vomiting). 30 tablet 1   tamoxifen (NOLVADEX) 20 MG tablet Take 1 tablet (20 mg total) by mouth daily. 30 tablet  3   VITAMIN D PO Take 1 capsule by mouth daily.     No current facility-administered medications for this visit.     PHYSICAL EXAMINATION: ECOG PERFORMANCE STATUS: 0 - Asymptomatic Vitals:   02/05/22 0846  BP: 107/68  Pulse: 80  Resp: 16  Temp: (!) 97.4 F (36.3 C)  SpO2: 100%   Filed Weights   02/05/22 0846  Weight: 112 lb 8 oz (51 kg)    Physical Exam Constitutional:      General: She is not in acute distress. HENT:     Head: Normocephalic and atraumatic.  Eyes:     General: No scleral icterus. Cardiovascular:     Rate and Rhythm: Regular rhythm.     Heart sounds: Normal heart sounds.  Pulmonary:     Effort: Pulmonary effort is normal. No respiratory distress.     Breath sounds: No wheezing.  Abdominal:     General: Bowel sounds are normal. There is no distension.     Palpations: Abdomen is soft.  Musculoskeletal:        General: No deformity. Normal range of motion.     Cervical back: Normal range of motion  and neck supple.  Skin:    General: Skin is warm and dry.     Findings: No erythema or rash.  Neurological:     Mental Status: She is alert. Mental status is at baseline.     Cranial Nerves: No cranial nerve deficit.     Coordination: Coordination normal.  Psychiatric:        Mood and Affect: Mood normal.   .   LABORATORY DATA:  I have reviewed the data as listed Lab Results  Component Value Date   WBC 5.3 02/05/2022   HGB 12.7 02/05/2022   HCT 38.5 02/05/2022   MCV 93.4 02/05/2022   PLT 328 02/05/2022   Recent Labs    12/18/21 0824 01/08/22 1026 02/05/22 0832  NA 136 133* 137  K 3.7 3.8 3.6  CL 104 106 105  CO2 _0 GLUCOSE 120* 107* 128*  BUN _1 CREATININE 0.75 0.65 0.75  CALCIUM 9.4 9.1 9.4  GFRNONAA >60 >60 >60  PROT 7.2 7.8 8.0  ALBUMIN 3.8 3.9 4.0  AST _2 ALT _3 ALKPHOS 46 46 50  BILITOT 0.5 0.4 0.3    Iron/TIBC/Ferritin/ %Sat No results found for: IRON, TIBC, FERRITIN, IRONPCTSAT     RADIOGRAPHIC STUDIES: I have personally reviewed the radiological images as listed and agreed with the findings in the report. No results found.    ASSESSMENT & PLAN:  1. Invasive carcinoma of breast ([place])   2. Encounter for antineoplastic chemotherapy   3. Encounter for monitoring cardiotoxic drug therapy    Cancer Staging  Invasive carcinoma of breast ([place]) Staging form: Breast, AJCC 8th Edition - Pathologic stage from 04/22/2021: Stage IA (pT1c, pN0, cM0, G2, ER+, PR+, HER2+) - Signed by Earlie Server, MD on 04/22/2021    Cancer Staging  Invasive carcinoma of breast [facility]) Staging form: Breast, AJCC 8th Edition - Pathologic stage from 04/22/2021: Stage IA (pT1c, pN0, cM0, G2, ER+, PR+, HER2+) - Signed by Earlie Server, MD on 04/22/2021  #Left invasive carcinoma of breast,pT1c pN0 cM0 status postlumpectomy and sentinel lymph node biopsy- HER2 positive, ER 11-50% positive,  PR 1-10% positive.-,  Post adjuvant chemotherapy Taxol weekly x12, currently on maintenance trastuzumab.  Lab results reviewed and discussed with patient Proceed with maintenance trastuzumab today. Recommend patient to continue tamoxifen 20 mg daily.  Repeat 2D echocardiogram.  Recommend annual gynecology evaluation for pelvic examination-last seen by gynecology Dr. Leafy Ro on 12/03/2021. Recommend annual mammogram-which is scheduled in June 2023.  #Heartburn, improved, continue omeprazole 20 mg daily as needed for symptoms.   I will attempt to use online [place] language interpreter service which was not available during this encounter.  Interpreter in person service is not available.  Patient is able to ask questions in English and communicate with me.  She appears to fully understand our recommendation. We spent sufficient time to discuss many aspect of care, questions were answered to patient's satisfaction.  All questions were answered. The patient knows to call the clinic with any problems questions or  concerns.  cc Kirk Ruths, MD   Follow-up in  3 weeks lab MD trastuzumab maintenance   Earlie Server, MD, PhD. 02/05/2022

## 2022-02-05 NOTE — Patient Instructions (Signed)
[facility] CANCER CTR AT [place]  Discharge Instructions: Thank you for choosing [facility] to provide your oncology and hematology care.  If you have a lab appointment with the [facility], please go directly to the [facility] and check in at the registration area.  Wear comfortable clothing and clothing appropriate for easy access to any Portacath or PICC line.   We strive to give you quality time with your provider. You may need to reschedule your appointment if you arrive late (15 or more minutes).  Arriving late affects you and other patients whose appointments are after yours.  Also, if you miss three or more appointments without notifying the office, you may be dismissed from the clinic at the provider's discretion.      For prescription refill requests, have your pharmacy contact our office and allow 72 hours for refills to be completed.    Today you received the following chemotherapy and/or immunotherapy agents Ogivri   To help prevent nausea and vomiting after your treatment, we encourage you to take your nausea medication as directed.  BELOW ARE SYMPTOMS THAT SHOULD BE REPORTED IMMEDIATELY: *FEVER GREATER THAN 100.4 F (38 C) OR HIGHER *CHILLS OR SWEATING *NAUSEA AND VOMITING THAT IS NOT CONTROLLED WITH YOUR NAUSEA MEDICATION *UNUSUAL SHORTNESS OF BREATH *UNUSUAL BRUISING OR BLEEDING *URINARY PROBLEMS (pain or burning when urinating, or frequent urination) *BOWEL PROBLEMS (unusual diarrhea, constipation, pain near the anus) TENDERNESS IN MOUTH AND THROAT WITH OR WITHOUT PRESENCE OF ULCERS (sore throat, sores in mouth, or a toothache) UNUSUAL RASH, SWELLING OR PAIN  UNUSUAL VAGINAL DISCHARGE OR ITCHING   Items with * indicate a potential emergency and should be followed up as soon as possible or go to the Emergency Department if any problems should occur.  Please show the CHEMOTHERAPY ALERT CARD or IMMUNOTHERAPY ALERT CARD at check-in to the  Emergency Department and triage nurse.  Should you have questions after your visit or need to cancel or reschedule your appointment, please contact [facility] CANCER [place] AT [place]  [phone] and follow the prompts.  Office hours are 8:00 a.m. to 4:30 p.m. Monday - Friday. Please note that voicemails left after 4:00 p.m. may not be returned until the following business day.  We are closed weekends and major holidays. You have access to a nurse at all times for urgent questions. Please call the main number to the clinic [phone] and follow the prompts.  For any non-urgent questions, you may also contact your provider using MyChart. We now offer e-Visits for anyone 47 and older to request care online for non-urgent symptoms. For details visit [email or web address].   Also download the MyChart app! Go to the app store, search "MyChart", open the app, select [place], and log in with your MyChart username and password.  Due to Covid, a mask is required upon entering the hospital/clinic. If you do not have a mask, one will be given to you upon arrival. For doctor visits, patients may have 1 support person aged 60 or older with them. For treatment visits, patients cannot have anyone with them due to current Covid guidelines and our immunocompromised population.

## 2022-02-15 ENCOUNTER — Ambulatory Visit
Admission: RE | Admit: 2022-02-15 | Discharge: 2022-02-15 | Disposition: A | Discharge status: Home / Self Care | Payer: Medicare Other | Source: Ambulatory Visit | Attending: Oncology | Admitting: Oncology

## 2022-02-15 DIAGNOSIS — I081 Rheumatic disorders of both mitral and tricuspid valves: Secondary | ICD-10-CM | POA: Diagnosis not present

## 2022-02-15 DIAGNOSIS — Z5181 Encounter for therapeutic drug level monitoring: Secondary | ICD-10-CM | POA: Diagnosis not present

## 2022-02-15 DIAGNOSIS — E876 Hypokalemia: Secondary | ICD-10-CM | POA: Diagnosis not present

## 2022-02-15 DIAGNOSIS — R569 Unspecified convulsions: Secondary | ICD-10-CM | POA: Diagnosis not present

## 2022-02-15 DIAGNOSIS — Z79899 Other long term (current) drug therapy: Secondary | ICD-10-CM | POA: Insufficient documentation

## 2022-02-15 DIAGNOSIS — Z0189 Encounter for other specified special examinations: Secondary | ICD-10-CM

## 2022-02-15 DIAGNOSIS — Q909 Down syndrome, unspecified: Secondary | ICD-10-CM | POA: Insufficient documentation

## 2022-02-15 LAB — ECHOCARDIOGRAM COMPLETE
AR max vel: 2.16 cm2
AV Area VTI: 2.54 cm2
AV Area mean vel: 2.23 cm2
AV Mean grad: 2.5 mmHg
AV Peak grad: 5 mmHg
Ao pk vel: 1.12 m/s
Area-P 1/2: 3.48 cm2
MV VTI: 1.84 cm2
S' Lateral: 2.4 cm

## 2022-02-15 NOTE — Progress Notes (Signed)
*  PRELIMINARY RESULTS* Echocardiogram 2D Echocardiogram has been performed.  Anna Banks 02/15/2022, 10:01 AM

## 2022-02-16 ENCOUNTER — Other Ambulatory Visit: Payer: Self-pay | Admitting: Oncology

## 2022-02-26 ENCOUNTER — Inpatient Hospital Stay: Payer: Medicare Other | Attending: Nurse Practitioner

## 2022-02-26 ENCOUNTER — Inpatient Hospital Stay: Payer: Medicare Other

## 2022-02-26 ENCOUNTER — Other Ambulatory Visit: Payer: Self-pay

## 2022-02-26 ENCOUNTER — Encounter: Payer: Self-pay | Admitting: Oncology

## 2022-02-26 ENCOUNTER — Inpatient Hospital Stay (HOSPITAL_BASED_OUTPATIENT_CLINIC_OR_DEPARTMENT_OTHER): Payer: Medicare Other | Admitting: Oncology

## 2022-02-26 VITALS — BP 121/64 | HR 79

## 2022-02-26 DIAGNOSIS — Z170 Estrogen receptor positive status [ER+]: Secondary | ICD-10-CM | POA: Diagnosis not present

## 2022-02-26 DIAGNOSIS — K219 Gastro-esophageal reflux disease without esophagitis: Secondary | ICD-10-CM | POA: Insufficient documentation

## 2022-02-26 DIAGNOSIS — Z5111 Encounter for antineoplastic chemotherapy: Secondary | ICD-10-CM

## 2022-02-26 DIAGNOSIS — C50912 Malignant neoplasm of unspecified site of left female breast: Secondary | ICD-10-CM | POA: Diagnosis present

## 2022-02-26 DIAGNOSIS — Z79810 Long term (current) use of selective estrogen receptor modulators (SERMs): Secondary | ICD-10-CM | POA: Diagnosis not present

## 2022-02-26 DIAGNOSIS — Z5112 Encounter for antineoplastic immunotherapy: Secondary | ICD-10-CM | POA: Diagnosis present

## 2022-02-26 DIAGNOSIS — C50919 Malignant neoplasm of unspecified site of unspecified female breast: Secondary | ICD-10-CM

## 2022-02-26 DIAGNOSIS — Z79899 Other long term (current) drug therapy: Secondary | ICD-10-CM | POA: Diagnosis not present

## 2022-02-26 LAB — COMPREHENSIVE METABOLIC PANEL
ALT: 20 U/L (ref 0–44)
AST: 28 U/L (ref 15–41)
Albumin: 3.9 g/dL (ref 3.5–5.0)
Alkaline Phosphatase: 50 U/L (ref 38–126)
Anion gap: 7 (ref 5–15)
BUN: 12 mg/dL (ref 6–20)
CO2: 27 mmol/L (ref 22–32)
Calcium: 9.5 mg/dL (ref 8.9–10.3)
Chloride: 105 mmol/L (ref 98–111)
Creatinine, Ser: 0.8 mg/dL (ref 0.44–1.00)
GFR, Estimated: >60 mL/min (ref >60)
Glucose, Bld: 106 mg/dL — ABNORMAL HIGH (ref 70–99)
Potassium: 3.7 mmol/L (ref 3.5–5.1)
Sodium: 139 mmol/L (ref 135–145)
Total Bilirubin: 0.4 mg/dL (ref 0.3–1.2)
Total Protein: 7.9 g/dL (ref 6.5–8.1)

## 2022-02-26 LAB — CBC WITH DIFFERENTIAL/PLATELET
Abs Immature Granulocytes: 0.02 10*3/uL (ref 0.00–0.07)
Basophils Absolute: 0 10*3/uL (ref 0.0–0.1)
Basophils Relative: 1 %
Eosinophils Absolute: 0.4 10*3/uL (ref 0.0–0.5)
Eosinophils Relative: 6 %
HCT: 37.9 % (ref 36.0–46.0)
Hemoglobin: 12.3 g/dL (ref 12.0–15.0)
Immature Granulocytes: 0 %
Lymphocytes Relative: 31 %
Lymphs Abs: 1.8 10*3/uL (ref 0.7–4.0)
MCH: 30.5 pg (ref 26.0–34.0)
MCHC: 32.5 g/dL (ref 30.0–36.0)
MCV: 94 fL (ref 80.0–100.0)
Monocytes Absolute: 0.5 10*3/uL (ref 0.1–1.0)
Monocytes Relative: 9 %
Neutro Abs: 3 10*3/uL (ref 1.7–7.7)
Neutrophils Relative %: 53 %
Platelets: 346 10*3/uL (ref 150–400)
RBC: 4.03 MIL/uL (ref 3.87–5.11)
RDW: 11.9 % (ref 11.5–15.5)
WBC: 5.7 10*3/uL (ref 4.0–10.5)
nRBC: 0 % (ref 0.0–0.2)

## 2022-02-26 LAB — PREGNANCY, URINE: Preg Test, Ur: NEGATIVE

## 2022-02-26 MED ORDER — SODIUM CHLORIDE 0.9 % IV SOLN
Freq: Once | INTRAVENOUS | Status: AC
  Filled 2022-02-26: qty 250

## 2022-02-26 MED ORDER — TRASTUZUMAB-DKST CHEMO 150 MG IV SOLR
300.0000 mg | Freq: Once | INTRAVENOUS | Status: AC
  Administered 2022-02-26: 300 mg via INTRAVENOUS
  Filled 2022-02-26: qty 14.29

## 2022-02-26 MED ORDER — ACETAMINOPHEN 325 MG PO TABS
650.0000 mg | ORAL_TABLET | Freq: Once | ORAL | Status: AC
  Administered 2022-02-26: 650 mg via ORAL
  Filled 2022-02-26: qty 2

## 2022-02-26 MED ORDER — DIPHENHYDRAMINE HCL 25 MG PO CAPS
50.0000 mg | ORAL_CAPSULE | Freq: Once | ORAL | Status: AC
  Administered 2022-02-26: 50 mg via ORAL
  Filled 2022-02-26: qty 2

## 2022-02-26 NOTE — Progress Notes (Addendum)
Hematology/[facility] Progress note [phone] [phone]      Patient Care Team: Anna Ruths, MD as PCP - General (Internal Medicine) Anna Server, MD as Consulting Physician ([facility])  ASSESSMENT & PLAN:   Invasive carcinoma of breast [facility]) Left invasive carcinoma of breast,pT1c pN0 cM0 status postlumpectomy and sentinel lymph node biopsy- HER2 positive, ER 11-50% positive,  PR 1-10% positive.-,  Post adjuvant chemotherapy Taxol weekly x12, currently on maintenance trastuzumab.  Lab results reviewed and discussed with patient Proceed with maintenance trastuzumab today. Recommend patient to continue tamoxifen 20 mg daily.   Recommend annual gynecology evaluation for pelvic examination-last seen by gynecology Anna Banks on 12/03/2021.  Encounter for antineoplastic chemotherapy Therapy as listed above.   GERD (gastroesophageal reflux disease) Continue Omeprazole 65m daily. No orders of the defined types were placed in this encounter.  [place] interpreter service was utilized during the entire encounter.  All questions were answered. The patient knows to call the clinic with any problems, questions or concerns.  Anna Banks 02/26/2022       CHIEF COMPLAINTS/REASON FOR VISIT:  Follow up for treatment of breast cancer  HISTORY OF PRESENTING ILLNESS:   Anna BoucheTYasenia Reedyis a  45y.o.  female presents for HER2 positive breast cancer.  [facility] History  [place] of breast [place]  03/23/2021 Initial Diagnosis   Invasive carcinoma of breast  Menarche 46years of age. G0P0. No child, no previous pregnancy. Per her sister, patient is not sexually active No prior use of birth control pills or hormone replacement therapy. Denies previous history of biopsy.   02/16/2021, screening mammogram showed calcification in the right breast needs further evaluation.  Left breast possible mass with  distortion requires further evaluation. 02/26/2021, left breast showed a 10 mm indeterminate mass, 2:00, 8 cm from nipple.  No mammographic evidence of malignancy in the right breast.  No left axillary lymphadenopathy. 03/10/2021, patient underwent ultrasound-guided biopsy of left breast mass.  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  ER low positive [1-10%], PR positive [1-10%], HER2 positive by IHC 3+   # 04/09/2021, status postlumpectomy and sentinel lymph node biopsy. Invasive mammary carcinoma, no special type, 4 lymph nodes all negative for metastatic carcinoma.  DCIS present.  LVI not identified.Grade 2, all margins negative for invasive carcinoma.pT1c pN0, ER [staining was repeated on surgical specimen] [11-50%]+ PR [1-10%]+, HER 2+     04/22/2021 Cancer Staging   Staging form: Breast, AJCC 8th Edition - Pathologic stage from 04/22/2021: Stage IA (pT1c, pN0, cM0, G2, ER+, PR+, HER2+) - Signed by YEarlie Server MD on 04/22/2021 Stage prefix: Initial diagnosis Multigene prognostic tests performed: None Histologic grading system: 3 grade system   05/08/2021 -  Chemotherapy   Adjuvant chemotherapy Paclitaxel + Trastuzumab q7d / Trastuzumab q21d      10/01/2021 -  Radiation Therapy    adjuvant radiation of the breast   10/15/2021 -  Anti-estrogen oral therapy   started on tamoxifen 20 mg daily.   02/15/2022 Echocardiogram   Left ventricular ejection fraction, by estimation, is 60 to 65%.    INTERVAL HISTORY Anna BoucheTMomo Braunis a 46y.o. female who has above history reviewed by me today presents for follow up visit for chemotherapy for HER2 positive breast cancer  She is doing well clinically.  Denies any nausea vomiting diarrhea, shortness of breath, leg swelling, joint pain.  Patient takes tamoxifen 20 mg daily. She tolerates well.    Review of Systems  Unable to perform ROS: Other (intellectual/Cognitive  impairment)  Constitutional:  Negative for appetite change, chills, fatigue and fever.  HENT:   Negative for hearing loss and voice change.   Eyes:  Negative for eye problems.  Respiratory:  Negative for chest tightness and cough.   Cardiovascular:  Negative for chest pain.  Gastrointestinal:  Negative for abdominal distention, abdominal pain, blood in stool, nausea and vomiting.  Endocrine: Negative for hot flashes.  Genitourinary:  Negative for difficulty urinating and frequency.   Musculoskeletal:  Negative for arthralgias.  Skin:  Negative for itching and rash.  Neurological:  Negative for extremity weakness.  Hematological:  Negative for adenopathy.  Psychiatric/Behavioral:  Negative for confusion.     MEDICAL HISTORY:  Past Medical History:  Diagnosis Date   Down syndrome    Seizure [facility])     SURGICAL HISTORY: Past Surgical History:  Procedure Laterality Date   BREAST BIOPSY Left 03/10/2021   [place] BX,ribbon clip, IMC   BREAST LUMPECTOMY,RADIO FREQ LOCALIZER,AXILLARY SENTINEL LYMPH NODE BIOPSY Left 04/09/2021   Procedure: BREAST LUMPECTOMY,RADIO FREQ LOCALIZER,AXILLARY SENTINEL LYMPH NODE BIOPSY;  Surgeon: Anna Sprague, DO;  Location: [facility] ORS;  Service: General;  Laterality: Left;    SOCIAL HISTORY: Social History   Socioeconomic History   Marital status: Single    Spouse name: Not on file   Number of children: Not on file   Years of education: Not on file   Highest education level: Not on file  Occupational History   Not on file  Tobacco Use   Smoking status: Never   Smokeless tobacco: Never  Vaping Use   Vaping Use: Never used  Substance and Sexual Activity   Alcohol use: Never   Drug use: Not Currently   Sexual activity: Not on file  Other Topics Concern   Not on file  Social History Narrative   Not on file   Social Determinants of Health   Financial Resource Strain: Not on file  Food Insecurity: Not on file  Transportation Needs: Not on file   Physical Activity: Not on file  Stress: Not on file  Social Connections: Not on file  Intimate Partner Violence: Not on file    FAMILY HISTORY: History reviewed. No pertinent family history.  ALLERGIES:  has No Known Allergies.  MEDICATIONS:  Current Outpatient Medications  Medication Sig Dispense Refill   acetaminophen (TYLENOL) 325 MG tablet Take 325-650 mg by mouth every 6 (six) hours as needed for moderate pain.     ethosuximide (ZARONTIN) 250 MG capsule Take 250 mg by mouth 2 (two) times daily.     HYDROcodone-acetaminophen (NORCO) 5-325 MG tablet Take 1 tablet by mouth every 6 (six) hours as needed for up to 6 doses for moderate pain. 6 tablet 0   KLOR-CON M20 20 MEQ tablet TAKE 1 TABLET BY MOUTH EVERY DAY 90 tablet 1   omeprazole (PRILOSEC) 20 MG capsule Take 1 capsule by mouth daily.     ondansetron (ZOFRAN) 8 MG tablet Take 1 tablet (8 mg total) by mouth 2 (two) times daily as needed (Nausea or vomiting). 30 tablet 1   tamoxifen (NOLVADEX) 20 MG tablet TAKE 1 TABLET BY MOUTH EVERY DAY 90 tablet 1   VITAMIN D PO Take 1 capsule by mouth daily.     No current facility-administered medications for this visit.     PHYSICAL EXAMINATION: ECOG PERFORMANCE STATUS: 0 - Asymptomatic Vitals:  02/26/22 0843  BP: 131/70  Pulse: 80  Temp: (!) 97.5 F (36.4 C)   Filed Weights   02/26/22 0843  Weight: 113 lb (51.3 kg)    Physical Exam Constitutional:      General: She is not in acute distress. HENT:     Head: Normocephalic and atraumatic.  Eyes:     General: No scleral icterus. Cardiovascular:     Rate and Rhythm: Regular rhythm.     Heart sounds: Normal heart sounds.  Pulmonary:     Effort: Pulmonary effort is normal. No respiratory distress.     Breath sounds: No wheezing.  Abdominal:     General: Bowel sounds are normal. There is no distension.     Palpations: Abdomen is soft.  Musculoskeletal:        General: No deformity. Normal range of motion.     Cervical  back: Normal range of motion and neck supple.  Skin:    General: Skin is warm and dry.     Findings: No erythema or rash.  Neurological:     Mental Status: She is alert. Mental status is at baseline.     Cranial Nerves: No cranial nerve deficit.     Coordination: Coordination normal.  Psychiatric:        Mood and Affect: Mood normal.    .   LABORATORY DATA:  I have reviewed the data as listed Lab Results  Component Value Date   WBC 5.7 02/26/2022   HGB 12.3 02/26/2022   HCT 37.9 02/26/2022   MCV 94.0 02/26/2022   PLT 346 02/26/2022   Recent Labs    01/08/22 1026 02/05/22 0832 02/26/22 0826  NA 133* 137 139  K 3.8 3.6 3.7  CL 106 105 105  CO2 _0 GLUCOSE 107* 128* 106*  BUN _1 CREATININE 0.65 0.75 0.80  CALCIUM 9.1 9.4 9.5  GFRNONAA >60 >60 >60  PROT 7.8 8.0 7.9  ALBUMIN 3.9 4.0 3.9  AST _2 ALT _3 ALKPHOS 46 50 50  BILITOT 0.4 0.3 0.4    Iron/TIBC/Ferritin/ %Sat No results found for: "IRON", "TIBC", "FERRITIN", "IRONPCTSAT"    RADIOGRAPHIC STUDIES: I have personally reviewed the radiological images as listed and agreed with the findings in the report. ECHOCARDIOGRAM COMPLETE  Result Date: 02/15/2022    ECHOCARDIOGRAM REPORT   Patient Name:   Anna Banks Date of Exam: 02/15/2022 Medical Rec #:  [id number]        Height:       62.0 in Accession #:    [id number]       Weight:       112.5 lb Date of Birth:  10-31-1975        BSA:          1.497 m Patient Age:    11 years         BP:           107/68 mmHg Patient Gender: F                HR:           80 bpm. Exam Location:  [facility] Procedure: 2D Echo, Color Doppler, Cardiac Doppler and Strain Analysis Indications:     Chemo Z09  History:         Patient has prior history of Echocardiogram examinations, most  recent 11/02/2021. Down syndrome, seizure,hypokalemia.  Sonographer:     Sherrie Sport Referring Phys:  [id number] Bascom Melvina Pangelinan Diagnosing Phys: Kathlyn Sacramento MD  Sonographer  Comments: Global longitudinal strain was attempted. IMPRESSIONS  1. Left ventricular ejection fraction, by estimation, is 60 to 65%. The left ventricle has normal function. The left ventricle has no regional wall motion abnormalities. Left ventricular diastolic parameters were normal.  2. Right ventricular systolic function is normal. The right ventricular size is normal. Tricuspid regurgitation signal is inadequate for assessing PA pressure.  3. The mitral valve is normal in structure. Trivial mitral valve regurgitation. No evidence of mitral stenosis.  4. The aortic valve is normal in structure. Aortic valve regurgitation is not visualized. No aortic stenosis is present.  5. The inferior vena cava is normal in size with greater than 50% respiratory variability, suggesting right atrial pressure of 3 mmHg. FINDINGS  Left Ventricle: Left ventricular ejection fraction, by estimation, is 60 to 65%. The left ventricle has normal function. The left ventricle has no regional wall motion abnormalities. Global longitudinal strain performed but not reported based on interpreter judgement due to suboptimal tracking. The left ventricular internal cavity size was normal in size. There is no left ventricular hypertrophy. Left ventricular diastolic parameters were normal. Right Ventricle: The right ventricular size is normal. No increase in right ventricular wall thickness. Right ventricular systolic function is normal. Tricuspid regurgitation signal is inadequate for assessing PA pressure. Left Atrium: Left atrial size was normal in size. Right Atrium: Right atrial size was normal in size. Pericardium: There is no evidence of pericardial effusion. Mitral Valve: The mitral valve is normal in structure. Trivial mitral valve regurgitation. No evidence of mitral valve stenosis. MV peak gradient, 5.3 mmHg. The mean mitral valve gradient is 2.0 mmHg. Tricuspid Valve: The tricuspid valve is normal in structure. Tricuspid valve  regurgitation is not demonstrated. No evidence of tricuspid stenosis. Aortic Valve: The aortic valve is normal in structure. Aortic valve regurgitation is not visualized. No aortic stenosis is present. Aortic valve mean gradient measures 2.5 mmHg. Aortic valve peak gradient measures 5.0 mmHg. Aortic valve area, by VTI measures 2.54 cm. Pulmonic Valve: The pulmonic valve was normal in structure. Pulmonic valve regurgitation is trivial. No evidence of pulmonic stenosis. Aorta: The aortic root is normal in size and structure. Venous: The inferior vena cava is normal in size with greater than 50% respiratory variability, suggesting right atrial pressure of 3 mmHg. IAS/Shunts: No atrial level shunt detected by color flow Doppler.  LEFT VENTRICLE PLAX 2D LVIDd:         3.80 cm   Diastology LVIDs:         2.40 cm   LV e' medial:    7.94 cm/s LV PW:         1.10 cm   LV E/e' medial:  13.6 LV IVS:        0.80 cm   LV e' lateral:   12.00 cm/s LVOT diam:     2.00 cm   LV E/e' lateral: 9.0 LV SV:         51 LV SV Index:   34 LVOT Area:     3.14 cm                           3D Volume EF:  3D EF:        65 %                          LV EDV:       109 ml                          LV ESV:       39 ml                          LV SV:        70 ml RIGHT VENTRICLE RV Basal diam:  3.20 cm RV S prime:     13.10 cm/s TAPSE (M-mode): 2.1 cm LEFT ATRIUM             Index        RIGHT ATRIUM           Index LA diam:        2.60 cm 1.74 cm/m   RA Area:     14.30 cm LA Vol (A2C):   42.2 ml 28.19 ml/m  RA Volume:   32.70 ml  21.84 ml/m LA Vol (A4C):   33.4 ml 22.31 ml/m LA Biplane Vol: 40.1 ml 26.79 ml/m  AORTIC VALVE                    PULMONIC VALVE AV Area (Vmax):    2.16 cm     PV Vmax:        0.71 m/s AV Area (Vmean):   2.23 cm     PV Vmean:       49.900 cm/s AV Area (VTI):     2.54 cm     PV VTI:         0.142 m AV Vmax:           112.00 cm/s  PV Peak grad:   2.0 mmHg AV Vmean:          73.050 cm/s  PV  Mean grad:   1.0 mmHg AV VTI:            0.200 m      RVOT Peak grad: 4 mmHg AV Peak Grad:      5.0 mmHg AV Mean Grad:      2.5 mmHg LVOT Vmax:         77.00 cm/s LVOT Vmean:        51.800 cm/s LVOT VTI:          0.162 m LVOT/AV VTI ratio: 0.81  AORTA Ao Root diam: 2.90 cm MITRAL VALVE MV Area (PHT): 3.48 cm     SHUNTS MV Area VTI:   1.84 cm     Systemic VTI:  0.16 m MV Peak grad:  5.3 mmHg     Systemic Diam: 2.00 cm MV Mean grad:  2.0 mmHg     Pulmonic VTI:  0.168 m MV Vmax:       1.15 m/s MV Vmean:      66.1 cm/s MV Decel Time: 218 msec MV E velocity: 108.00 cm/s MV A velocity: 86.50 cm/s MV E/A ratio:  1.25 Kathlyn Sacramento MD Electronically signed by Kathlyn Sacramento MD Signature Date/Time: 02/15/2022/12:24:06 PM    Final

## 2022-02-26 NOTE — Assessment & Plan Note (Signed)
Therapy as listed above.  

## 2022-02-26 NOTE — Assessment & Plan Note (Signed)
Continue Omeprazole 20mg daily.   

## 2022-02-26 NOTE — Patient Instructions (Signed)
[facility]  Discharge Instructions: Thank you for choosing [facility] to provide your oncology and hematology care.   If you have a lab appointment with the Cancer Center, please go directly to the Cancer Center and check in at the registration area.   Wear comfortable clothing and clothing appropriate for easy access to any Portacath or PICC line.   We strive to give you quality time with your provider. You may need to reschedule your appointment if you arrive late (15 or more minutes).  Arriving late affects you and other patients whose appointments are after yours.  Also, if you miss three or more appointments without notifying the office, you may be dismissed from the clinic at the provider's discretion.      For prescription refill requests, have your pharmacy contact our office and allow 72 hours for refills to be completed.    Today you received the following chemotherapy and/or immunotherapy agents       To help prevent nausea and vomiting after your treatment, we encourage you to take your nausea medication as directed.  BELOW ARE SYMPTOMS THAT SHOULD BE REPORTED IMMEDIATELY: *FEVER GREATER THAN 100.4 F (38 C) OR HIGHER *CHILLS OR SWEATING *NAUSEA AND VOMITING THAT IS NOT CONTROLLED WITH YOUR NAUSEA MEDICATION *UNUSUAL SHORTNESS OF BREATH *UNUSUAL BRUISING OR BLEEDING *URINARY PROBLEMS (pain or burning when urinating, or frequent urination) *BOWEL PROBLEMS (unusual diarrhea, constipation, pain near the anus) TENDERNESS IN MOUTH AND THROAT WITH OR WITHOUT PRESENCE OF ULCERS (sore throat, sores in mouth, or a toothache) UNUSUAL RASH, SWELLING OR PAIN  UNUSUAL VAGINAL DISCHARGE OR ITCHING   Items with * indicate a potential emergency and should be followed up as soon as possible or go to the Emergency Department if any problems should occur.  Please show the CHEMOTHERAPY ALERT CARD or IMMUNOTHERAPY ALERT CARD at check-in to the  Emergency Department and triage nurse.  Should you have questions after your visit or need to cancel or reschedule your appointment, please contact [facility]  Dept: [phone]  and follow the prompts.  Office hours are 8:00 a.m. to 4:30 p.m. Monday - Friday. Please note that voicemails left after 4:00 p.m. may not be returned until the following business day.  We are closed weekends and major holidays. You have access to a nurse at all times for urgent questions. Please call the main number to the clinic Dept: [phone] and follow the prompts.   For any non-urgent questions, you may also contact your provider using MyChart. We now offer e-Visits for anyone 18 and older to request care online for non-urgent symptoms. For details visit [email or web address].   Also download the MyChart app! Go to the app store, search "MyChart", open the app, select [facility], and log in with your MyChart username and password.  Masks are optional in the cancer centers. If you would like for your care team to wear a mask while they are taking care of you, please let them know. For doctor visits, patients may have with them one support person who is at least 44 years old. At this time, visitors are not allowed in the infusion area. 

## 2022-02-26 NOTE — Assessment & Plan Note (Addendum)
Left invasive carcinoma of breast,pT1c pN0 cM0 status postlumpectomy and sentinel lymph node biopsy- HER2 positive, ER 11-50% positive,  PR 1-10% positive.-,  Post adjuvant chemotherapy Taxol weekly x12, currently on maintenance trastuzumab.  Lab results reviewed and discussed with patient Proceed with maintenance trastuzumab today. Recommend patient to continue tamoxifen 20 mg daily.   Recommend annual gynecology evaluation for pelvic examination-last seen by gynecology Dr. Leafy Ro on 12/03/2021.

## 2022-03-10 ENCOUNTER — Ambulatory Visit
Admission: RE | Admit: 2022-03-10 | Discharge: 2022-03-10 | Disposition: A | Discharge status: Home / Self Care | Payer: Medicare Other | Source: Ambulatory Visit | Attending: Oncology | Admitting: Oncology

## 2022-03-10 DIAGNOSIS — Z853 Personal history of malignant neoplasm of breast: Secondary | ICD-10-CM | POA: Insufficient documentation

## 2022-03-19 ENCOUNTER — Inpatient Hospital Stay: Payer: Medicare Other | Attending: Nurse Practitioner

## 2022-03-19 ENCOUNTER — Inpatient Hospital Stay: Payer: Medicare Other

## 2022-03-19 VITALS — BP 118/73 | HR 74 | Temp 98.4°F | Resp 18 | Ht 62.0 in | Wt 113.5 lb

## 2022-03-19 DIAGNOSIS — K219 Gastro-esophageal reflux disease without esophagitis: Secondary | ICD-10-CM | POA: Diagnosis not present

## 2022-03-19 DIAGNOSIS — C50919 Malignant neoplasm of unspecified site of unspecified female breast: Secondary | ICD-10-CM

## 2022-03-19 DIAGNOSIS — Z170 Estrogen receptor positive status [ER+]: Secondary | ICD-10-CM | POA: Insufficient documentation

## 2022-03-19 DIAGNOSIS — Z5112 Encounter for antineoplastic immunotherapy: Secondary | ICD-10-CM | POA: Diagnosis not present

## 2022-03-19 DIAGNOSIS — C50912 Malignant neoplasm of unspecified site of left female breast: Secondary | ICD-10-CM | POA: Diagnosis present

## 2022-03-19 LAB — COMPREHENSIVE METABOLIC PANEL
ALT: 14 U/L (ref 0–44)
AST: 23 U/L (ref 15–41)
Albumin: 3.8 g/dL (ref 3.5–5.0)
Alkaline Phosphatase: 46 U/L (ref 38–126)
Anion gap: 8 (ref 5–15)
BUN: 12 mg/dL (ref 6–20)
CO2: 25 mmol/L (ref 22–32)
Calcium: 9.2 mg/dL (ref 8.9–10.3)
Chloride: 103 mmol/L (ref 98–111)
Creatinine, Ser: 0.56 mg/dL (ref 0.44–1.00)
GFR, Estimated: >60 mL/min (ref >60)
Glucose, Bld: 102 mg/dL — ABNORMAL HIGH (ref 70–99)
Potassium: 3.5 mmol/L (ref 3.5–5.1)
Sodium: 136 mmol/L (ref 135–145)
Total Bilirubin: 0.4 mg/dL (ref 0.3–1.2)
Total Protein: 7.7 g/dL (ref 6.5–8.1)

## 2022-03-19 LAB — CBC WITH DIFFERENTIAL/PLATELET
Abs Immature Granulocytes: 0.01 10*3/uL (ref 0.00–0.07)
Basophils Absolute: 0 10*3/uL (ref 0.0–0.1)
Basophils Relative: 1 %
Eosinophils Absolute: 0.2 10*3/uL (ref 0.0–0.5)
Eosinophils Relative: 4 %
HCT: 37.6 % (ref 36.0–46.0)
Hemoglobin: 12.2 g/dL (ref 12.0–15.0)
Immature Granulocytes: 0 %
Lymphocytes Relative: 30 %
Lymphs Abs: 1.7 10*3/uL (ref 0.7–4.0)
MCH: 30.8 pg (ref 26.0–34.0)
MCHC: 32.4 g/dL (ref 30.0–36.0)
MCV: 94.9 fL (ref 80.0–100.0)
Monocytes Absolute: 0.5 10*3/uL (ref 0.1–1.0)
Monocytes Relative: 9 %
Neutro Abs: 3.2 10*3/uL (ref 1.7–7.7)
Neutrophils Relative %: 56 %
Platelets: 316 10*3/uL (ref 150–400)
RBC: 3.96 MIL/uL (ref 3.87–5.11)
RDW: 11.9 % (ref 11.5–15.5)
WBC: 5.7 10*3/uL (ref 4.0–10.5)
nRBC: 0 % (ref 0.0–0.2)

## 2022-03-19 LAB — PREGNANCY, URINE: Preg Test, Ur: NEGATIVE

## 2022-03-19 MED ORDER — SODIUM CHLORIDE 0.9 % IV SOLN
Freq: Once | INTRAVENOUS | Status: AC
  Filled 2022-03-19: qty 250

## 2022-03-19 MED ORDER — ACETAMINOPHEN 325 MG PO TABS
650.0000 mg | ORAL_TABLET | Freq: Once | ORAL | Status: AC
  Administered 2022-03-19: 650 mg via ORAL
  Filled 2022-03-19: qty 2

## 2022-03-19 MED ORDER — TRASTUZUMAB-DKST CHEMO 150 MG IV SOLR
300.0000 mg | Freq: Once | INTRAVENOUS | Status: AC
  Administered 2022-03-19: 300 mg via INTRAVENOUS
  Filled 2022-03-19: qty 14.29

## 2022-03-19 MED ORDER — DIPHENHYDRAMINE HCL 25 MG PO CAPS
50.0000 mg | ORAL_CAPSULE | Freq: Once | ORAL | Status: AC
  Administered 2022-03-19: 50 mg via ORAL
  Filled 2022-03-19: qty 2

## 2022-03-19 NOTE — Patient Instructions (Signed)
[facility] CANCER CTR AT [place]  Discharge Instructions: Thank you for choosing [facility] to provide your oncology and hematology care.  If you have a lab appointment with the [facility], please go directly to the [facility] and check in at the registration area.  Wear comfortable clothing and clothing appropriate for easy access to any Portacath or PICC line.   We strive to give you quality time with your provider. You may need to reschedule your appointment if you arrive late (15 or more minutes).  Arriving late affects you and other patients whose appointments are after yours.  Also, if you miss three or more appointments without notifying the office, you may be dismissed from the clinic at the provider's discretion.      For prescription refill requests, have your pharmacy contact our office and allow 72 hours for refills to be completed.    Today you received the following chemotherapy and/or immunotherapy agents OGIVIRI      To help prevent nausea and vomiting after your treatment, we encourage you to take your nausea medication as directed.  BELOW ARE SYMPTOMS THAT SHOULD BE REPORTED IMMEDIATELY: *FEVER GREATER THAN 100.4 F (38 C) OR HIGHER *CHILLS OR SWEATING *NAUSEA AND VOMITING THAT IS NOT CONTROLLED WITH YOUR NAUSEA MEDICATION *UNUSUAL SHORTNESS OF BREATH *UNUSUAL BRUISING OR BLEEDING *URINARY PROBLEMS (pain or burning when urinating, or frequent urination) *BOWEL PROBLEMS (unusual diarrhea, constipation, pain near the anus) TENDERNESS IN MOUTH AND THROAT WITH OR WITHOUT PRESENCE OF ULCERS (sore throat, sores in mouth, or a toothache) UNUSUAL RASH, SWELLING OR PAIN  UNUSUAL VAGINAL DISCHARGE OR ITCHING   Items with * indicate a potential emergency and should be followed up as soon as possible or go to the Emergency Department if any problems should occur.  Please show the CHEMOTHERAPY ALERT CARD or IMMUNOTHERAPY ALERT CARD at check-in to the  Emergency Department and triage nurse.  Should you have questions after your visit or need to cancel or reschedule your appointment, please contact [facility] CANCER [place] AT [place]  [phone] and follow the prompts.  Office hours are 8:00 a.m. to 4:30 p.m. Monday - Friday. Please note that voicemails left after 4:00 p.m. may not be returned until the following business day.  We are closed weekends and major holidays. You have access to a nurse at all times for urgent questions. Please call the main number to the clinic [phone] and follow the prompts.  For any non-urgent questions, you may also contact your provider using MyChart. We now offer e-Visits for anyone 84 and older to request care online for non-urgent symptoms. For details visit [email or web address].   Also download the MyChart app! Go to the app store, search "MyChart", open the app, select [facility], and log in with your MyChart username and password.  Masks are optional in the cancer centers. If you would like for your care team to wear a mask while they are taking care of you, please let them know. For doctor visits, patients may have with them one support person who is at least 44 years old. At this time, visitors are not allowed in the infusion area.   Trastuzumab injection for infusion What is this medication? TRASTUZUMAB (tras TOO zoo mab) is a monoclonal antibody. It is used to treat breast cancer and stomach cancer. This medicine may be used for other purposes; ask your health care provider or pharmacist if you have questions. COMMON BRAND NAME(S): Herceptin, Belenda Cruise, Ogivri, Chalmers Guest  What should I tell my care team before I take this medication? They need to know if you have any of these conditions: heart disease heart failure lung or breathing disease, like asthma an unusual or allergic reaction to trastuzumab, benzyl alcohol, or other medications, foods, dyes, or  preservatives pregnant or trying to get pregnant breast-feeding How should I use this medication? This drug is given as an infusion into a vein. It is administered in a hospital or clinic by a specially trained health care professional. Talk to your pediatrician regarding the use of this medicine in children. This medicine is not approved for use in children. Overdosage: If you think you have taken too much of this medicine contact a poison control center or emergency room at once. NOTE: This medicine is only for you. Do not share this medicine with others. What if I miss a dose? It is important not to miss a dose. Call your doctor or health care professional if you are unable to keep an appointment. What may interact with this medication? This medicine may interact with the following medications: certain types of chemotherapy, such as daunorubicin, doxorubicin, epirubicin, and idarubicin This list may not describe all possible interactions. Give your health care provider a list of all the medicines, herbs, non-prescription drugs, or dietary supplements you use. Also tell them if you smoke, drink alcohol, or use illegal drugs. Some items may interact with your medicine. What should I watch for while using this medication? Visit your doctor for checks on your progress. Report any side effects. Continue your course of treatment even though you feel ill unless your doctor tells you to stop. Call your doctor or health care professional for advice if you get a fever, chills or sore throat, or other symptoms of a cold or flu. Do not treat yourself. Try to avoid being around people who are sick. You may experience fever, chills and shaking during your first infusion. These effects are usually mild and can be treated with other medicines. Report any side effects during the infusion to your health care professional. Fever and chills usually do not happen with later infusions. Do not become pregnant while  taking this medicine or for 7 months after stopping it. Women should inform their doctor if they wish to become pregnant or think they might be pregnant. Women of child-bearing potential will need to have a negative pregnancy test before starting this medicine. There is a potential for serious side effects to an unborn child. Talk to your health care professional or pharmacist for more information. Do not breast-feed an infant while taking this medicine or for 7 months after stopping it. Women must use effective birth control with this medicine. What side effects may I notice from receiving this medication? Side effects that you should report to your doctor or health care professional as soon as possible: allergic reactions like skin rash, itching or hives, swelling of the face, lips, or tongue chest pain or palpitations cough dizziness feeling faint or lightheaded, falls fever general ill feeling or flu-like symptoms signs of worsening heart failure like breathing problems; swelling in your legs and feet unusually weak or tired Side effects that usually do not require medical attention (report to your doctor or health care professional if they continue or are bothersome): bone pain changes in taste diarrhea joint pain nausea/vomiting weight loss This list may not describe all possible side effects. Call your doctor for medical advice about side effects. You may report side effects to  FDA at 1-800-FDA-1088. Where should I keep my medication? This drug is given in a hospital or clinic and will not be stored at home. NOTE: This sheet is a summary. It may not cover all possible information. If you have questions about this medicine, talk to your doctor, pharmacist, or health care provider.  2023 Elsevier/Gold Standard (2016-09-14 00:00:00)

## 2022-04-05 ENCOUNTER — Other Ambulatory Visit: Payer: Self-pay

## 2022-04-09 ENCOUNTER — Encounter: Payer: Self-pay | Admitting: Oncology

## 2022-04-09 ENCOUNTER — Inpatient Hospital Stay: Payer: Medicare Other

## 2022-04-09 ENCOUNTER — Inpatient Hospital Stay (HOSPITAL_BASED_OUTPATIENT_CLINIC_OR_DEPARTMENT_OTHER): Payer: Medicare Other | Admitting: Oncology

## 2022-04-09 DIAGNOSIS — C50919 Malignant neoplasm of unspecified site of unspecified female breast: Secondary | ICD-10-CM | POA: Diagnosis not present

## 2022-04-09 DIAGNOSIS — Z5111 Encounter for antineoplastic chemotherapy: Secondary | ICD-10-CM

## 2022-04-09 DIAGNOSIS — K219 Gastro-esophageal reflux disease without esophagitis: Secondary | ICD-10-CM

## 2022-04-09 DIAGNOSIS — Z5112 Encounter for antineoplastic immunotherapy: Secondary | ICD-10-CM | POA: Diagnosis not present

## 2022-04-09 LAB — COMPREHENSIVE METABOLIC PANEL
ALT: 19 U/L (ref 0–44)
AST: 25 U/L (ref 15–41)
Albumin: 3.9 g/dL (ref 3.5–5.0)
Alkaline Phosphatase: 49 U/L (ref 38–126)
Anion gap: 7 (ref 5–15)
BUN: 15 mg/dL (ref 6–20)
CO2: 27 mmol/L (ref 22–32)
Calcium: 9.7 mg/dL (ref 8.9–10.3)
Chloride: 106 mmol/L (ref 98–111)
Creatinine, Ser: 0.74 mg/dL (ref 0.44–1.00)
GFR, Estimated: >60 mL/min (ref >60)
Glucose, Bld: 93 mg/dL (ref 70–99)
Potassium: 3.7 mmol/L (ref 3.5–5.1)
Sodium: 140 mmol/L (ref 135–145)
Total Bilirubin: 0.5 mg/dL (ref 0.3–1.2)
Total Protein: 7.8 g/dL (ref 6.5–8.1)

## 2022-04-09 LAB — CBC WITH DIFFERENTIAL/PLATELET
Abs Immature Granulocytes: 0.02 10*3/uL (ref 0.00–0.07)
Basophils Absolute: 0 10*3/uL (ref 0.0–0.1)
Basophils Relative: 1 %
Eosinophils Absolute: 0.1 10*3/uL (ref 0.0–0.5)
Eosinophils Relative: 3 %
HCT: 36.4 % (ref 36.0–46.0)
Hemoglobin: 11.9 g/dL — ABNORMAL LOW (ref 12.0–15.0)
Immature Granulocytes: 0 %
Lymphocytes Relative: 38 %
Lymphs Abs: 1.8 10*3/uL (ref 0.7–4.0)
MCH: 30.7 pg (ref 26.0–34.0)
MCHC: 32.7 g/dL (ref 30.0–36.0)
MCV: 94.1 fL (ref 80.0–100.0)
Monocytes Absolute: 0.4 10*3/uL (ref 0.1–1.0)
Monocytes Relative: 8 %
Neutro Abs: 2.4 10*3/uL (ref 1.7–7.7)
Neutrophils Relative %: 50 %
Platelets: 264 10*3/uL (ref 150–400)
RBC: 3.87 MIL/uL (ref 3.87–5.11)
RDW: 12.7 % (ref 11.5–15.5)
WBC: 4.8 10*3/uL (ref 4.0–10.5)
nRBC: 0 % (ref 0.0–0.2)

## 2022-04-09 LAB — PREGNANCY, URINE: Preg Test, Ur: NEGATIVE

## 2022-04-09 MED ORDER — SODIUM CHLORIDE 0.9 % IV SOLN
Freq: Once | INTRAVENOUS | Status: AC
  Filled 2022-04-09: qty 250

## 2022-04-09 MED ORDER — TRASTUZUMAB-DKST CHEMO 150 MG IV SOLR
300.0000 mg | Freq: Once | INTRAVENOUS | Status: AC
  Administered 2022-04-09: 300 mg via INTRAVENOUS
  Filled 2022-04-09: qty 14.29

## 2022-04-09 MED ORDER — DIPHENHYDRAMINE HCL 25 MG PO CAPS
50.0000 mg | ORAL_CAPSULE | Freq: Once | ORAL | Status: AC
  Administered 2022-04-09: 50 mg via ORAL
  Filled 2022-04-09: qty 2

## 2022-04-09 MED ORDER — ACETAMINOPHEN 325 MG PO TABS
650.0000 mg | ORAL_TABLET | Freq: Once | ORAL | Status: AC
  Administered 2022-04-09: 650 mg via ORAL
  Filled 2022-04-09: qty 2

## 2022-04-09 NOTE — Assessment & Plan Note (Addendum)
  Left invasive carcinoma of breast,pT1c pN0 cM0 status postlumpectomy and sentinel lymph node biopsy- HER2 positive, ER 11-50% positive,  PR 1-10% positive.-,  Post adjuvant chemotherapy Taxol weekly x12, currently on maintenance trastuzumab.  Lab results reviewed and discussed with patient Proceed with maintenance trastuzumab today. continue tamoxifen 20 mg daily.   Recommend annual gynecology evaluation for pelvic examination-[last seen by gynecology Dr. Leafy Ro on 12/03/2021].  # age of diagnosis is <50, recommend genetic testing.

## 2022-04-09 NOTE — Assessment & Plan Note (Signed)
Continue Omeprazole 20mg daily.   

## 2022-04-09 NOTE — Progress Notes (Signed)
Hematology/Oncology Progress note [phone] [phone]      Patient Care Team: Kirk Ruths, MD as PCP - General (Internal Medicine) Earlie Server, MD as Consulting Physician (Oncology)  ASSESSMENT & PLAN:   Invasive carcinoma of breast [facility])  Left invasive carcinoma of breast,pT1c pN0 cM0 status postlumpectomy and sentinel lymph node biopsy- HER2 positive, ER 11-50% positive,  PR 1-10% positive.-,  Post adjuvant chemotherapy Taxol weekly x12, currently on maintenance trastuzumab.  Lab results reviewed and discussed with patient Proceed with maintenance trastuzumab today. continue tamoxifen 20 mg daily.   Recommend annual gynecology evaluation for pelvic examination-[last seen by gynecology Dr. Leafy Ro on 12/03/2021].  # age of diagnosis is <50, recommend genetic testing.   Encounter for antineoplastic chemotherapy Therapy as listed above.   GERD (gastroesophageal reflux disease) Continue Omeprazole 57m daily. No orders of the defined types were placed in this encounter.  V[place]inea-B[place] interpreter service was utilized during the entire encounter.   Follow up in 3 weeks.  All questions were answered. The patient knows to call the clinic with any problems, questions or concerns.  ZEarlie Server MD, PhD [facility] Oncology 04/09/2022       CHIEF COMPLAINTS/REASON FOR VISIT:  Follow up for treatment of breast cancer  HISTORY OF PRESENTING ILLNESS:   Anna BoucheTBriggett Tuccillois a  46y.o.  female presents for HER2 positive breast cancer.  Oncology History  [place]  [place] Initial Diagnosis   Invasive carcinoma of breast  Menarche 46years of age. G0P0. No child, no previous pregnancy. Per her sister, patient is not sexually active No prior use of birth control pills or hormone replacement therapy. Denies previous history of biopsy.   02/16/2021, screening mammogram showed calcification in the right breast  needs further evaluation.  Left breast possible mass with distortion requires further evaluation. 02/26/2021, left breast showed a 10 mm indeterminate mass, 2:00, 8 cm from nipple.  No mammographic evidence of malignancy in the right breast.  No left axillary lymphadenopathy. 03/10/2021, patient underwent ultrasound-guided biopsy of left breast mass.  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  ER low positive [1-10%], PR positive [1-10%], HER2 positive by IHC 3+   # 04/09/2021, status postlumpectomy and sentinel lymph node biopsy. Invasive mammary carcinoma, no special type, 4 lymph nodes all negative for metastatic carcinoma.  DCIS present.  LVI not identified.Grade 2, all margins negative for invasive carcinoma.pT1c pN0, ER [staining was repeated on surgical specimen] [11-50%]+ PR [1-10%]+, HER 2+     04/22/2021 Cancer Staging   Staging form: Breast, AJCC 8th Edition - Pathologic stage from 04/22/2021: Stage IA (pT1c, pN0, cM0, G2, ER+, PR+, HER2+) - Signed by YEarlie Server MD on 04/22/2021 Stage prefix: Initial diagnosis Multigene prognostic tests performed: None Histologic grading system: 3 grade system   05/08/2021 -  Chemotherapy   Adjuvant chemotherapy Paclitaxel + Trastuzumab q7d / Trastuzumab q21d      10/01/2021 -  Radiation Therapy    adjuvant radiation of the breast   10/15/2021 -  Anti-estrogen oral therapy   started on tamoxifen 20 mg daily.   02/15/2022 Echocardiogram   Left ventricular ejection fraction, by estimation, is 60 to 65%.    INTERVAL HISTORY Anna BoucheTAnnleigh Knueppelis a 46y.o. female who has above history reviewed by me today presents for follow up visit for chemotherapy for HER2 positive breast cancer  Tolerates tamoxifen 20  mg daily. Recently she dined out and felt sick and throw up after eating. Symptoms have resolved.  No other new complaints.    Review of Systems  Unable to perform ROS: Other  (intellectual/Cognitive impairment)  Constitutional:  Negative for appetite change, chills, fatigue and fever.  HENT:   Negative for hearing loss and voice change.   Eyes:  Negative for eye problems.  Respiratory:  Negative for chest tightness and cough.   Cardiovascular:  Negative for chest pain.  Gastrointestinal:  Negative for abdominal distention, abdominal pain, blood in stool, nausea and vomiting.  Endocrine: Negative for hot flashes.  Genitourinary:  Negative for difficulty urinating and frequency.   Musculoskeletal:  Negative for arthralgias.  Skin:  Negative for itching and rash.  Neurological:  Negative for extremity weakness.  Hematological:  Negative for adenopathy.  Psychiatric/Behavioral:  Negative for confusion.     MEDICAL HISTORY:  Past Medical History:  Diagnosis Date   Down syndrome    Seizure [facility])     SURGICAL HISTORY: Past Surgical History:  Procedure Laterality Date   BREAST BIOPSY Left 03/10/2021   [place] BX,ribbon clip, IMC   BREAST LUMPECTOMY,RADIO FREQ LOCALIZER,AXILLARY SENTINEL LYMPH NODE BIOPSY Left 04/09/2021   Procedure: BREAST LUMPECTOMY,RADIO FREQ LOCALIZER,AXILLARY SENTINEL LYMPH NODE BIOPSY;  Surgeon: Benjamine Sprague, DO;  Location: [facility] ORS;  Service: General;  Laterality: Left;    SOCIAL HISTORY: Social History   Socioeconomic History   Marital status: Single    Spouse name: Not on file   Number of children: Not on file   Years of education: Not on file   Highest education level: Not on file  Occupational History   Not on file  Tobacco Use   Smoking status: Never   Smokeless tobacco: Never  Vaping Use   Vaping Use: Never used  Substance and Sexual Activity   Alcohol use: Never   Drug use: Not Currently   Sexual activity: Not on file  Other Topics Concern   Not on file  Social History Narrative   Not on file   Social Determinants of Health   Financial Resource Strain: Not on file  Food Insecurity: Not on file  Transportation  Needs: Not on file  Physical Activity: Not on file  Stress: Not on file  Social Connections: Not on file  Intimate Partner Violence: Not on file    FAMILY HISTORY: History reviewed. No pertinent family history.  ALLERGIES:  has No Known Allergies.  MEDICATIONS:  Current Outpatient Medications  Medication Sig Dispense Refill   acetaminophen (TYLENOL) 325 MG tablet Take 325-650 mg by mouth every 6 (six) hours as needed for moderate pain.     ethosuximide (ZARONTIN) 250 MG capsule Take 250 mg by mouth 2 (two) times daily.     HYDROcodone-acetaminophen (NORCO) 5-325 MG tablet Take 1 tablet by mouth every 6 (six) hours as needed for up to 6 doses for moderate pain. 6 tablet 0   KLOR-CON M20 20 MEQ tablet TAKE 1 TABLET BY MOUTH EVERY DAY 90 tablet 1   omeprazole (PRILOSEC) 20 MG capsule Take 1 capsule by mouth daily.     ondansetron (ZOFRAN) 8 MG tablet Take 1 tablet (8 mg total) by mouth 2 (two) times daily as needed (Nausea or vomiting). 30 tablet 1   tamoxifen (NOLVADEX) 20 MG tablet TAKE 1 TABLET BY MOUTH EVERY DAY 90 tablet 1   VITAMIN D PO Take 1 capsule by mouth daily.     No current facility-administered medications for this visit.  PHYSICAL EXAMINATION: ECOG PERFORMANCE STATUS: 0 - Asymptomatic Vitals:   04/09/22 0948  BP: (!) 128/49  Pulse: 78  Temp: 97.6 F (36.4 C)  SpO2: 100%   Filed Weights   04/09/22 0948  Weight: 112 lb 3.2 oz (50.9 kg)    Physical Exam Constitutional:      General: She is not in acute distress. HENT:     Head: Normocephalic and atraumatic.  Eyes:     General: No scleral icterus. Cardiovascular:     Rate and Rhythm: Regular rhythm.     Heart sounds: Normal heart sounds.  Pulmonary:     Effort: Pulmonary effort is normal. No respiratory distress.     Breath sounds: No wheezing.  Abdominal:     General: Bowel sounds are normal. There is no distension.     Palpations: Abdomen is soft.  Musculoskeletal:        General: No  deformity. Normal range of motion.     Cervical back: Normal range of motion and neck supple.  Skin:    General: Skin is warm and dry.     Findings: No erythema or rash.  Neurological:     Mental Status: She is alert. Mental status is at baseline.     Cranial Nerves: No cranial nerve deficit.     Coordination: Coordination normal.  Psychiatric:        Mood and Affect: Mood normal.    .   LABORATORY DATA:  I have reviewed the data as listed Lab Results  Component Value Date   WBC 4.8 04/09/2022   HGB 11.9 (L) 04/09/2022   HCT 36.4 04/09/2022   MCV 94.1 04/09/2022   PLT 264 04/09/2022   Recent Labs    02/26/22 0826 03/19/22 0907 04/09/22 0951  NA 139 136 140  K 3.7 3.5 3.7  CL 105 103 106  CO2 27 25 27   GLUCOSE 106* 102* 93  BUN 12 12 15   CREATININE 0.80 0.56 0.74  CALCIUM 9.5 9.2 9.7  GFRNONAA >60 >60 >60  PROT 7.9 7.7 7.8  ALBUMIN 3.9 3.8 3.9  AST 28 23 25   ALT 20 14 19   ALKPHOS 50 46 49  BILITOT 0.4 0.4 0.5    Iron/TIBC/Ferritin/ %Sat No results found for: "IRON", "TIBC", "FERRITIN", "IRONPCTSAT"    RADIOGRAPHIC STUDIES: I have personally reviewed the radiological images as listed and agreed with the findings in the report. No results found.

## 2022-04-09 NOTE — Patient Instructions (Signed)
[facility] CANCER CTR AT [place]  Discharge Instructions: Thank you for choosing [facility] to provide your oncology and hematology care.  If you have a lab appointment with the [facility], please go directly to the [facility] and check in at the registration area.  Wear comfortable clothing and clothing appropriate for easy access to any Portacath or PICC line.   We strive to give you quality time with your provider. You may need to reschedule your appointment if you arrive late (15 or more minutes).  Arriving late affects you and other patients whose appointments are after yours.  Also, if you miss three or more appointments without notifying the office, you may be dismissed from the clinic at the provider's discretion.      For prescription refill requests, have your pharmacy contact our office and allow 72 hours for refills to be completed.    Today you received the following chemotherapy and/or immunotherapy agents OGIVIR      To help prevent nausea and vomiting after your treatment, we encourage you to take your nausea medication as directed.  BELOW ARE SYMPTOMS THAT SHOULD BE REPORTED IMMEDIATELY: *FEVER GREATER THAN 100.4 F (38 C) OR HIGHER *CHILLS OR SWEATING *NAUSEA AND VOMITING THAT IS NOT CONTROLLED WITH YOUR NAUSEA MEDICATION *UNUSUAL SHORTNESS OF BREATH *UNUSUAL BRUISING OR BLEEDING *URINARY PROBLEMS (pain or burning when urinating, or frequent urination) *BOWEL PROBLEMS (unusual diarrhea, constipation, pain near the anus) TENDERNESS IN MOUTH AND THROAT WITH OR WITHOUT PRESENCE OF ULCERS (sore throat, sores in mouth, or a toothache) UNUSUAL RASH, SWELLING OR PAIN  UNUSUAL VAGINAL DISCHARGE OR ITCHING   Items with * indicate a potential emergency and should be followed up as soon as possible or go to the Emergency Department if any problems should occur.  Please show the CHEMOTHERAPY ALERT CARD or IMMUNOTHERAPY ALERT CARD at check-in to the  Emergency Department and triage nurse.  Should you have questions after your visit or need to cancel or reschedule your appointment, please contact [facility] CANCER [place] AT [place]  [phone] and follow the prompts.  Office hours are 8:00 a.m. to 4:30 p.m. Monday - Friday. Please note that voicemails left after 4:00 p.m. may not be returned until the following business day.  We are closed weekends and major holidays. You have access to a nurse at all times for urgent questions. Please call the main number to the clinic [phone] and follow the prompts.  For any non-urgent questions, you may also contact your provider using MyChart. We now offer e-Visits for anyone 7 and older to request care online for non-urgent symptoms. For details visit [email or web address].   Also download the MyChart app! Go to the app store, search "MyChart", open the app, select [facility], and log in with your MyChart username and password.  Masks are optional in the cancer centers. If you would like for your care team to wear a mask while they are taking care of you, please let them know. For doctor visits, patients may have with them one support person who is at least 44 years old. At this time, visitors are not allowed in the infusion area.

## 2022-04-09 NOTE — Assessment & Plan Note (Signed)
Therapy as listed above.

## 2022-04-10 ENCOUNTER — Other Ambulatory Visit: Payer: Self-pay

## 2022-04-15 ENCOUNTER — Ambulatory Visit
Admission: RE | Admit: 2022-04-15 | Discharge: 2022-04-15 | Disposition: A | Discharge status: Home / Self Care | Payer: Medicare Other | Source: Ambulatory Visit | Attending: Radiation Oncology | Admitting: Radiation Oncology

## 2022-04-15 VITALS — BP 119/68 | HR 75 | Resp 18 | Ht 62.0 in | Wt 112.0 lb

## 2022-04-15 DIAGNOSIS — Z79810 Long term (current) use of selective estrogen receptor modulators (SERMs): Secondary | ICD-10-CM | POA: Diagnosis not present

## 2022-04-15 DIAGNOSIS — Z923 Personal history of irradiation: Secondary | ICD-10-CM | POA: Insufficient documentation

## 2022-04-15 DIAGNOSIS — C50919 Malignant neoplasm of unspecified site of unspecified female breast: Secondary | ICD-10-CM

## 2022-04-15 DIAGNOSIS — C50412 Malignant neoplasm of upper-outer quadrant of left female breast: Secondary | ICD-10-CM | POA: Insufficient documentation

## 2022-04-15 DIAGNOSIS — Z170 Estrogen receptor positive status [ER+]: Secondary | ICD-10-CM | POA: Insufficient documentation

## 2022-04-15 NOTE — Progress Notes (Signed)
Radiation Oncology Follow up Note  Name: Anna Banks   Date:   04/15/2022 MRN:  [id number] DOB: 03-28-1976    This 46 y.o. female presents to the clinic today for 78-monthfollow-up status post whole breast radiation to her left breast for stage Ia ER low PR positive and HER2/neu overexpressed invasive mammary carcinoma.  REFERRING PROVIDER: AKirk Ruths MD  HPI: Patient is a 46year old female now out 6 months having completed whole breast radiation to her left breast for stage Ia HER2/neu positive invasive mammary carcinoma seen today in routine follow-up she is doing well.  Specifically denies breast tenderness cough or bone pain..  She is currently on maintenance trastuzumab as well as tamoxifen.  Tolerating those well.  She had mammograms in June which I have reviewed were BI-RADS 2 benign.  COMPLICATIONS OF TREATMENT: none  FOLLOW UP COMPLIANCE: keeps appointments   PHYSICAL EXAM:  BP 119/68   Pulse 75   Resp 18   Ht 5' 2"  (1.575 m)   Wt 112 lb (50.8 kg)   BMI 20.49 kg/m  Lungs are clear to A&P cardiac examination essentially unremarkable with regular rate and rhythm. No dominant mass or nodularity is noted in either breast in 2 positions examined. Incision is well-healed. No axillary or supraclavicular adenopathy is appreciated. Cosmetic result is excellent.  Well-developed well-nourished patient in NAD. HEENT reveals PERLA, EOMI, discs not visualized.  Oral cavity is clear. No oral mucosal lesions are identified. Neck is clear without evidence of cervical or supraclavicular adenopathy. Lungs are clear to A&P. Cardiac examination is essentially unremarkable with regular rate and rhythm without murmur rub or thrill. Abdomen is benign with no organomegaly or masses noted. Motor sensory and DTR levels are equal and symmetric in the upper and lower extremities. Cranial nerves II through XII are grossly intact. Proprioception is intact. No peripheral adenopathy or edema is  identified. No motor or sensory levels are noted. Crude visual fields are within normal range.  RADIOLOGY RESULTS: Mammograms reviewed compatible with above-stated findings  PLAN: Present time patient is doing well she continues on treatment under medical oncology's direction.  On pleased with her overall progress.  I have asked to see her back in 6 months for follow-up and then will start once year follow-up visits.  Patient knows to call with any concerns.  I would like to take this opportunity to thank you for allowing me to participate in the care of your patient..Noreene Filbert MD

## 2022-04-16 ENCOUNTER — Other Ambulatory Visit: Payer: Self-pay

## 2022-05-04 ENCOUNTER — Encounter: Payer: Self-pay | Admitting: Oncology

## 2022-05-04 ENCOUNTER — Inpatient Hospital Stay: Payer: Medicare Other | Attending: Nurse Practitioner

## 2022-05-04 ENCOUNTER — Other Ambulatory Visit: Payer: Self-pay | Admitting: Oncology

## 2022-05-04 ENCOUNTER — Inpatient Hospital Stay (HOSPITAL_BASED_OUTPATIENT_CLINIC_OR_DEPARTMENT_OTHER): Payer: Medicare Other | Admitting: Oncology

## 2022-05-04 ENCOUNTER — Inpatient Hospital Stay: Payer: Medicare Other

## 2022-05-04 VITALS — BP 126/62 | HR 76 | Temp 97.9°F | Resp 18 | Wt 113.7 lb

## 2022-05-04 DIAGNOSIS — Z170 Estrogen receptor positive status [ER+]: Secondary | ICD-10-CM | POA: Diagnosis not present

## 2022-05-04 DIAGNOSIS — C50912 Malignant neoplasm of unspecified site of left female breast: Secondary | ICD-10-CM | POA: Diagnosis present

## 2022-05-04 DIAGNOSIS — C50919 Malignant neoplasm of unspecified site of unspecified female breast: Secondary | ICD-10-CM | POA: Diagnosis not present

## 2022-05-04 DIAGNOSIS — Z5111 Encounter for antineoplastic chemotherapy: Secondary | ICD-10-CM

## 2022-05-04 DIAGNOSIS — Z5112 Encounter for antineoplastic immunotherapy: Secondary | ICD-10-CM | POA: Insufficient documentation

## 2022-05-04 DIAGNOSIS — T80818A Extravasation of other vesicant agent, initial encounter: Secondary | ICD-10-CM | POA: Insufficient documentation

## 2022-05-04 LAB — CBC WITH DIFFERENTIAL/PLATELET
Abs Immature Granulocytes: 0.01 10*3/uL (ref 0.00–0.07)
Basophils Absolute: 0 10*3/uL (ref 0.0–0.1)
Basophils Relative: 1 %
Eosinophils Absolute: 0.2 10*3/uL (ref 0.0–0.5)
Eosinophils Relative: 4 %
HCT: 39.4 % (ref 36.0–46.0)
Hemoglobin: 12.8 g/dL (ref 12.0–15.0)
Immature Granulocytes: 0 %
Lymphocytes Relative: 42 %
Lymphs Abs: 2.1 10*3/uL (ref 0.7–4.0)
MCH: 30.7 pg (ref 26.0–34.0)
MCHC: 32.5 g/dL (ref 30.0–36.0)
MCV: 94.5 fL (ref 80.0–100.0)
Monocytes Absolute: 0.4 10*3/uL (ref 0.1–1.0)
Monocytes Relative: 8 %
Neutro Abs: 2.2 10*3/uL (ref 1.7–7.7)
Neutrophils Relative %: 45 %
Platelets: 306 10*3/uL (ref 150–400)
RBC: 4.17 MIL/uL (ref 3.87–5.11)
RDW: 13 % (ref 11.5–15.5)
WBC: 5 10*3/uL (ref 4.0–10.5)
nRBC: 0 % (ref 0.0–0.2)

## 2022-05-04 LAB — COMPREHENSIVE METABOLIC PANEL
ALT: 18 U/L (ref 0–44)
AST: 28 U/L (ref 15–41)
Albumin: 4.3 g/dL (ref 3.5–5.0)
Alkaline Phosphatase: 47 U/L (ref 38–126)
Anion gap: 8 (ref 5–15)
BUN: 13 mg/dL (ref 6–20)
CO2: 26 mmol/L (ref 22–32)
Calcium: 9.7 mg/dL (ref 8.9–10.3)
Chloride: 105 mmol/L (ref 98–111)
Creatinine, Ser: 0.69 mg/dL (ref 0.44–1.00)
GFR, Estimated: >60 mL/min (ref >60)
Glucose, Bld: 83 mg/dL (ref 70–99)
Potassium: 3.4 mmol/L — ABNORMAL LOW (ref 3.5–5.1)
Sodium: 139 mmol/L (ref 135–145)
Total Bilirubin: 0.4 mg/dL (ref 0.3–1.2)
Total Protein: 8.4 g/dL — ABNORMAL HIGH (ref 6.5–8.1)

## 2022-05-04 LAB — PREGNANCY, URINE: Preg Test, Ur: NEGATIVE

## 2022-05-04 MED ORDER — POTASSIUM CHLORIDE CRYS ER 20 MEQ PO TBCR: 20.0000 meq | EXTENDED_RELEASE_TABLET | Freq: Every day | ORAL | 0 refills | Status: DC

## 2022-05-04 MED ORDER — TRASTUZUMAB-DKST CHEMO 150 MG IV SOLR
300.0000 mg | Freq: Once | INTRAVENOUS | Status: AC
  Administered 2022-05-04: 300 mg via INTRAVENOUS
  Filled 2022-05-04: qty 14.29

## 2022-05-04 MED ORDER — DIPHENHYDRAMINE HCL 25 MG PO CAPS
50.0000 mg | ORAL_CAPSULE | Freq: Once | ORAL | Status: AC
  Administered 2022-05-04: 50 mg via ORAL
  Filled 2022-05-04: qty 2

## 2022-05-04 MED ORDER — ACETAMINOPHEN 325 MG PO TABS
650.0000 mg | ORAL_TABLET | Freq: Once | ORAL | Status: AC
  Administered 2022-05-04: 650 mg via ORAL
  Filled 2022-05-04: qty 2

## 2022-05-04 MED ORDER — SODIUM CHLORIDE 0.9 % IV SOLN
Freq: Once | INTRAVENOUS | Status: AC
  Filled 2022-05-04: qty 250

## 2022-05-04 NOTE — Assessment & Plan Note (Signed)
Therapy as listed above.

## 2022-05-04 NOTE — Assessment & Plan Note (Addendum)
  Left invasive carcinoma of breast,pT1c pN0 cM0 status postlumpectomy and sentinel lymph node biopsy- HER2 positive, ER 11-50% positive,  PR 1-10% positive.-,  Post adjuvant chemotherapy Taxol weekly x12, currently on maintenance trastuzumab.  Lab results reviewed and discussed with patient Proceed with maintenance trastuzumab today-last dose today. continue tamoxifen 20 mg daily.   Recommend annual gynecology evaluation for pelvic examination-[last seen by gynecology Dr. Leafy Ro on 12/03/2021].  # age of diagnosis is <50, recommend genetic testing.  Consider MRI breast #Recommend annual diagnostic mammogram

## 2022-05-04 NOTE — Patient Instructions (Addendum)
Medication that Maudie Mercury received today may have gone into the subcutaneous tissue as opposed to the vein or some combination thereof. To minimize tissue damage, I'd like for you to apply a cold compress for 20 minutes every 4 hours for the next 24 hours. We'll see Kim back in clinic to re-evaluate the arm in 2 days. If she develops pain, redness, or worsening swelling, please contact clinic so that we can see her sooner. Thank you. Beckey Rutter, NP [phone].     [facility] AT [place]  Discharge Instructions: Thank you for choosing [facility] to provide your oncology and hematology care.  If you have a lab appointment with the [facility], please go directly to the [facility] and check in at the registration area.  Wear comfortable clothing and clothing appropriate for easy access to any Portacath or PICC line.   We strive to give you quality time with your provider. You may need to reschedule your appointment if you arrive late (15 or more minutes).  Arriving late affects you and other patients whose appointments are after yours.  Also, if you miss three or more appointments without notifying the office, you may be dismissed from the clinic at the provider's discretion.      For prescription refill requests, have your pharmacy contact our office and allow 72 hours for refills to be completed.    Today you received the following chemotherapy and/or immunotherapy agents [place]      To help prevent nausea and vomiting after your treatment, we encourage you to take your nausea medication as directed.  BELOW ARE SYMPTOMS THAT SHOULD BE REPORTED IMMEDIATELY: *FEVER GREATER THAN 100.4 F (38 C) OR HIGHER *CHILLS OR SWEATING *NAUSEA AND VOMITING THAT IS NOT CONTROLLED WITH YOUR NAUSEA MEDICATION *UNUSUAL SHORTNESS OF BREATH *UNUSUAL BRUISING OR BLEEDING *URINARY PROBLEMS (pain or burning when urinating, or frequent urination) *BOWEL PROBLEMS (unusual  diarrhea, constipation, pain near the anus) TENDERNESS IN MOUTH AND THROAT WITH OR WITHOUT PRESENCE OF ULCERS (sore throat, sores in mouth, or a toothache) UNUSUAL RASH, SWELLING OR PAIN  UNUSUAL VAGINAL DISCHARGE OR ITCHING   Items with * indicate a potential emergency and should be followed up as soon as possible or go to the Emergency Department if any problems should occur.  Please show the CHEMOTHERAPY ALERT CARD or IMMUNOTHERAPY ALERT CARD at check-in to the Emergency Department and triage nurse.  Should you have questions after your visit or need to cancel or reschedule your appointment, please contact [facility] CANCER [place] AT [place]  [phone] and follow the prompts.  Office hours are 8:00 a.m. to 4:30 p.m. Monday - Friday. Please note that voicemails left after 4:00 p.m. may not be returned until the following business day.  We are closed weekends and major holidays. You have access to a nurse at all times for urgent questions. Please call the main number to the clinic [phone] and follow the prompts.  For any non-urgent questions, you may also contact your provider using MyChart. We now offer e-Visits for anyone 43 and older to request care online for non-urgent symptoms. For details visit [email or web address].   Also download the MyChart app! Go to the app store, search "MyChart", open the app, select [facility], and log in with your MyChart username and password.  Masks are optional in the cancer centers. If you would like for your care team to wear a mask while they are taking care of you, please let them know.  For doctor visits, patients may have with them one support person who is at least 44 years old. At this time, visitors are not allowed in the infusion area.

## 2022-05-04 NOTE — Progress Notes (Signed)
Hematology/Oncology Progress note [phone] [phone]      Patient Care Team: Kirk Ruths, MD as PCP - General (Internal Medicine) Earlie Server, MD as Consulting Physician (Oncology)  ASSESSMENT & PLAN:   Cancer Staging  Invasive carcinoma of breast [facility]) Staging form: Breast, AJCC 8th Edition - Pathologic stage from 04/22/2021: Stage IA (pT1c, pN0, cM0, G2, ER+, PR+, HER2+) - Signed by Earlie Server, MD on 04/22/2021   Invasive carcinoma of breast ([place])  Left invasive carcinoma of breast,pT1c pN0 cM0 status postlumpectomy and sentinel lymph node biopsy- HER2 positive, ER 11-50% positive,  PR 1-10% positive.-,  Post adjuvant chemotherapy Taxol weekly x12, currently on maintenance trastuzumab.  Lab results reviewed and discussed with patient Proceed with maintenance trastuzumab today-last dose today. continue tamoxifen 20 mg daily.   Recommend annual gynecology evaluation for pelvic examination-[last seen by gynecology Dr. Leafy Ro on 12/03/2021].  # age of diagnosis is <50, recommend genetic testing.  Consider MRI breast #Recommend annual diagnostic mammogram  Encounter for antineoplastic chemotherapy Therapy as listed above.   Extravasation accident Trastuzumab-irritant extravasation.  Patient was evaluated by NP Lauren. Discussed with Lauren and I agree with cold compress for 20 to 30 minutes 4 times per day. Patient will need to follow-up in 2 to 3 days for evaluation of any signs of soft tissue necrosis. Orders Placed This Encounter  Procedures   CBC with Differential/Platelet    Standing Status:   Future    Standing Expiration Date:   05/05/2023   Comprehensive metabolic panel    Standing Status:   Future    Standing Expiration Date:   05/04/2023   Cancer antigen 27.29    Standing Status:   Future    Standing Expiration Date:   05/05/2023   Cancer antigen 15-3    Standing Status:   Future    Standing Expiration Date:   05/05/2023    [place]  language interpreter service was utilized during the entire encounter.   Follow up in 3 months All questions were answered. The patient knows to call the clinic with any problems, questions or concerns.  Earlie Server, MD, PhD [facility] Hematology Oncology 05/04/2022       CHIEF COMPLAINTS/REASON FOR VISIT:  Follow up for treatment of breast cancer  HISTORY OF PRESENTING ILLNESS:   Anna Banks is a  46 y.o.  female presents for HER2 positive breast cancer.  Oncology History  Invasive carcinoma of breast ([place])  03/23/2021 Initial Diagnosis   Invasive carcinoma of breast  Menarche 46 years of age. G0P0. No child, no previous pregnancy. Per her sister, patient is not sexually active No prior use of birth control pills or hormone replacement therapy. Denies previous history of biopsy.   02/16/2021, screening mammogram showed calcification in the right breast needs further evaluation.  Left breast possible mass with distortion requires further evaluation. 02/26/2021, left breast showed a 10 mm indeterminate mass, 2:00, 8 cm from nipple.  No mammographic evidence of malignancy in the right breast.  No left axillary lymphadenopathy. 03/10/2021, patient underwent ultrasound-guided biopsy of left breast mass.  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  ER low positive [1-10%], PR positive [1-10%], HER2 positive by IHC 3+   # 04/09/2021, status postlumpectomy and sentinel lymph node biopsy. Invasive mammary carcinoma, no special type, 4 lymph nodes all negative for metastatic carcinoma.  DCIS present.  LVI not identified.Grade 2, all margins negative for invasive  carcinoma.pT1c pN0, ER [staining was repeated on surgical specimen] [11-50%]+ PR [1-10%]+, HER 2+     04/22/2021 Cancer Staging   Staging form: Breast, AJCC 8th Edition - Pathologic stage from 04/22/2021: Stage IA (pT1c, pN0, cM0, G2, ER+, PR+, HER2+) -  Signed by Earlie Server, MD on 04/22/2021 Stage prefix: Initial diagnosis Multigene prognostic tests performed: None Histologic grading system: 3 grade system   05/08/2021 - 05/04/2022 Chemotherapy   Adjuvant chemotherapy Paclitaxel + Trastuzumab q7d / Trastuzumab q21d      10/01/2021 -  Radiation Therapy    adjuvant radiation of the breast   10/15/2021 -  Anti-estrogen oral therapy   started on tamoxifen 20 mg daily.   02/15/2022 Echocardiogram   Left ventricular ejection fraction, by estimation, is 60 to 65%.   03/10/2022 Mammogram   Bilateral diagnostic mammogram showed No mammographic evidence of malignancy bilaterally.    INTERVAL HISTORY Anna Banks is a 46 y.o. female who has above history reviewed by me today presents for follow up visit for chemotherapy for HER2 positive breast cancer  Tolerates tamoxifen 20 mg daily.  Patient has no new complaints.   Review of Systems  Unable to perform ROS: Other (intellectual/Cognitive impairment)  Constitutional:  Negative for appetite change, chills, fatigue and fever.  HENT:   Negative for hearing loss and voice change.   Eyes:  Negative for eye problems.  Respiratory:  Negative for chest tightness and cough.   Cardiovascular:  Negative for chest pain.  Gastrointestinal:  Negative for abdominal distention, abdominal pain, blood in stool, nausea and vomiting.  Endocrine: Negative for hot flashes.  Genitourinary:  Negative for difficulty urinating and frequency.   Musculoskeletal:  Negative for arthralgias.  Skin:  Negative for itching and rash.  Neurological:  Negative for extremity weakness.  Hematological:  Negative for adenopathy.  Psychiatric/Behavioral:  Negative for confusion.     MEDICAL HISTORY:  Past Medical History:  Diagnosis Date   Down syndrome    Seizure [facility])     SURGICAL HISTORY: Past Surgical History:  Procedure Laterality Date   BREAST BIOPSY Left 03/10/2021   [place] BX,ribbon clip, IMC   BREAST  LUMPECTOMY,RADIO FREQ LOCALIZER,AXILLARY SENTINEL LYMPH NODE BIOPSY Left 04/09/2021   Procedure: BREAST LUMPECTOMY,RADIO FREQ LOCALIZER,AXILLARY SENTINEL LYMPH NODE BIOPSY;  Surgeon: Benjamine Sprague, DO;  Location: [facility] ORS;  Service: General;  Laterality: Left;    SOCIAL HISTORY: Social History   Socioeconomic History   Marital status: Single    Spouse name: Not on file   Number of children: Not on file   Years of education: Not on file   Highest education level: Not on file  Occupational History   Not on file  Tobacco Use   Smoking status: Never   Smokeless tobacco: Never  Vaping Use   Vaping Use: Never used  Substance and Sexual Activity   Alcohol use: Never   Drug use: Not Currently   Sexual activity: Not on file  Other Topics Concern   Not on file  Social History Narrative   Not on file   Social Determinants of Health   Financial Resource Strain: Not on file  Food Insecurity: Not on file  Transportation Needs: Not on file  Physical Activity: Not on file  Stress: Not on file  Social Connections: Not on file  Intimate Partner Violence: Not on file    FAMILY HISTORY: History reviewed. No pertinent family history.  ALLERGIES:  has No Known Allergies.  MEDICATIONS:  Current Outpatient Medications  Medication Sig Dispense Refill   acetaminophen (TYLENOL) 325 MG tablet Take 325-650 mg by mouth every 6 (six) hours as needed for moderate pain.     ethosuximide (ZARONTIN) 250 MG capsule Take 250 mg by mouth 2 (two) times daily.     HYDROcodone-acetaminophen (NORCO) 5-325 MG tablet Take 1 tablet by mouth every 6 (six) hours as needed for up to 6 doses for moderate pain. 6 tablet 0   omeprazole (PRILOSEC) 20 MG capsule Take 1 capsule by mouth daily.     ondansetron (ZOFRAN) 8 MG tablet Take 1 tablet (8 mg total) by mouth 2 (two) times daily as needed (Nausea or vomiting). 30 tablet 1   tamoxifen (NOLVADEX) 20 MG tablet TAKE 1 TABLET BY MOUTH EVERY DAY 90 tablet 1   VITAMIN  D PO Take 1 capsule by mouth daily.     potassium chloride SA (KLOR-CON M20) 20 MEQ tablet Take 1 tablet (20 mEq total) by mouth daily. 7 tablet 0   No current facility-administered medications for this visit.     PHYSICAL EXAMINATION: ECOG PERFORMANCE STATUS: 0 - Asymptomatic Vitals:   05/04/22 0936  BP: 126/62  Pulse: 76  Resp: 18  Temp: 97.9 F (36.6 C)   Filed Weights   05/04/22 0936  Weight: 113 lb 11.2 oz (51.6 kg)    Physical Exam Constitutional:      General: She is not in acute distress. HENT:     Head: Normocephalic and atraumatic.  Eyes:     General: No scleral icterus. Cardiovascular:     Rate and Rhythm: Regular rhythm.     Heart sounds: Normal heart sounds.  Pulmonary:     Effort: Pulmonary effort is normal. No respiratory distress.     Breath sounds: No wheezing.  Abdominal:     General: Bowel sounds are normal. There is no distension.     Palpations: Abdomen is soft.  Musculoskeletal:        General: No deformity. Normal range of motion.     Cervical back: Normal range of motion and neck supple.  Skin:    General: Skin is warm and dry.     Findings: No erythema or rash.  Neurological:     Mental Status: She is alert. Mental status is at baseline.     Cranial Nerves: No cranial nerve deficit.     Coordination: Coordination normal.  Psychiatric:        Mood and Affect: Mood normal.    .   LABORATORY DATA:  I have reviewed the data as listed Lab Results  Component Value Date   WBC 5.0 05/04/2022   HGB 12.8 05/04/2022   HCT 39.4 05/04/2022   MCV 94.5 05/04/2022   PLT 306 05/04/2022   Recent Labs    03/19/22 0907 04/09/22 0951 05/04/22 0919  NA 136 140 139  K 3.5 3.7 3.4*  CL 103 106 105  CO2 _0 GLUCOSE 102* 93 83  BUN _1 CREATININE 0.56 0.74 0.69  CALCIUM 9.2 9.7 9.7  GFRNONAA >60 >60 >60  PROT 7.7 7.8 8.4*  ALBUMIN 3.8 3.9 4.3  AST _2 ALT _3 ALKPHOS 46 49 47  BILITOT 0.4 0.5 0.4     Iron/TIBC/Ferritin/ %Sat No results found for: "IRON", "TIBC", "FERRITIN", "IRONPCTSAT"    RADIOGRAPHIC STUDIES: I have personally reviewed the radiological images as listed and agreed with the findings in the report. No results found.

## 2022-05-04 NOTE — Assessment & Plan Note (Signed)
Trastuzumab-irritant extravasation.  Patient was evaluated by NP Lauren. Discussed with Lauren and I agree with cold compress for 20 to 30 minutes 4 times per day. Patient will need to follow-up in 2 to 3 days for evaluation of any signs of soft tissue necrosis.

## 2022-05-04 NOTE — Progress Notes (Signed)
1200: PIV de-accessed, nurse noted swelling around IV site and right arm cool to touch. Beckey Rutter NP aware and at chairside.  1207: cold compress applied (for 20 minutes) per Beckey Rutter NP  3968: Cold compress removed.  After discussing plan with MD and pharmacy Beckey Rutter educated pt and Shanon Brow (patient's nephew by phone) to place a cold compress on site (right [facility]) for 20 minutes at a time every 4 hours for the next 24 hours and pt to be seen in clininc in 2 days.pt and Shanon Brow educated to call clinic if site worsens prior to visit. Both patient and nephew verbalize understanding.  Written instructions and updated appts provided to patient. Pt stable at discharge.

## 2022-05-04 NOTE — Progress Notes (Signed)
Pt here for follow up. No new concerns voiced.   

## 2022-05-05 ENCOUNTER — Telehealth: Payer: Self-pay

## 2022-05-05 DIAGNOSIS — C50919 Malignant neoplasm of unspecified site of unspecified female breast: Secondary | ICD-10-CM

## 2022-05-05 NOTE — Telephone Encounter (Signed)
Genetics referral has been entered.

## 2022-05-05 NOTE — Telephone Encounter (Signed)
-----   Message from Earlie Server, MD sent at 05/04/2022  9:48 PM EDT ----- Please refer patient to genetic counseling.[History of breast cancer, young age of diagnosis] I have previously discussed with patient, not specifically discussed during this encounter.

## 2022-05-06 ENCOUNTER — Inpatient Hospital Stay (HOSPITAL_BASED_OUTPATIENT_CLINIC_OR_DEPARTMENT_OTHER): Payer: Medicare Other | Admitting: Nurse Practitioner

## 2022-05-06 ENCOUNTER — Other Ambulatory Visit: Payer: Self-pay

## 2022-05-06 ENCOUNTER — Encounter: Payer: Self-pay | Admitting: Nurse Practitioner

## 2022-05-06 VITALS — BP 121/70 | HR 77 | Temp 98.4°F | Resp 20 | Ht 62.0 in | Wt 115.4 lb

## 2022-05-06 DIAGNOSIS — Z5112 Encounter for antineoplastic immunotherapy: Secondary | ICD-10-CM | POA: Diagnosis not present

## 2022-05-06 DIAGNOSIS — T80818A Extravasation of other vesicant agent, initial encounter: Secondary | ICD-10-CM

## 2022-05-06 NOTE — Progress Notes (Signed)
Pt stated she has been icing her right arm iv site as directed and the pain in her right arm has improved.

## 2022-05-06 NOTE — Progress Notes (Signed)
Sympt[facility] at [place]. [facility] [address] [place], [place] [address] [phone] (phone) [phone] (fax)  Patient Care Team: Kirk Ruths, MD as PCP - General (Internal Medicine) Earlie Server, MD as Consulting Physician (Oncology)   Name of the patient: Anna Banks  [id number]  August 05, 1976   Date of visit: 05/06/22  Diagnosis- breast cancer   Chief complaint/ Reason for visit- extravasation   Heme/Onc history:  Oncology History  Invasive carcinoma of breast ([place])  03/23/2021 Initial Diagnosis   Invasive carcinoma of breast  Menarche 46 years of age. G0P0. No child, no previous pregnancy. Per her sister, patient is not sexually active No prior use of birth control pills or hormone replacement therapy. Denies previous history of biopsy.   02/16/2021, screening mammogram showed calcification in the right breast needs further evaluation.  Left breast possible mass with distortion requires further evaluation. 02/26/2021, left breast showed a 10 mm indeterminate mass, 2:00, 8 cm from nipple.  No mammographic evidence of malignancy in the right breast.  No left axillary lymphadenopathy. 03/10/2021, patient underwent ultrasound-guided biopsy of left breast mass.  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  ER low positive [1-10%], PR positive [1-10%], HER2 positive by IHC 3+   # 04/09/2021, status postlumpectomy and sentinel lymph node biopsy. Invasive mammary carcinoma, no special type, 4 lymph nodes all negative for metastatic carcinoma.  DCIS present.  LVI not identified.Grade 2, all margins negative for invasive carcinoma.pT1c pN0, ER [staining was repeated on surgical specimen] [11-50%]+ PR [1-10%]+, HER 2+     04/22/2021 Cancer Staging   Staging form: Breast, AJCC 8th Edition -  Pathologic stage from 04/22/2021: Stage IA (pT1c, pN0, cM0, G2, ER+, PR+, HER2+) - Signed by Earlie Server, MD on 04/22/2021 Stage prefix: Initial diagnosis Multigene prognostic tests performed: None Histologic grading system: 3 grade system   05/08/2021 - 05/04/2022 Chemotherapy   Adjuvant chemotherapy Paclitaxel + Trastuzumab q7d / Trastuzumab q21d      10/01/2021 -  Radiation Therapy    adjuvant radiation of the breast   10/15/2021 -  Anti-estrogen oral therapy   started on tamoxifen 20 mg daily.   02/15/2022 Echocardiogram   Left ventricular ejection fraction, by estimation, is 60 to 65%.   03/10/2022 Mammogram   Bilateral diagnostic mammogram showed No mammographic evidence of malignancy bilaterally.     Interval history- Anna Banks is a 46 y.o. female who received infusion of trastuzumab on 05/04/22. Nursing raised concern for possible extravasation. Cold compresses were applied and patient has continued cold compresses for 20 minutes every 4 hours since infusion. She reports no pain, swelling, or redness at infusion site. Feels well and denies complaints.   Review of systems- Review of Systems  Constitutional:  Negative for chills and fever.  Skin:  Negative for itching and rash.     No Known Allergies  Past Medical History:  Diagnosis Date   Down syndrome    Seizure [facility])     Past Surgical History:  Procedure Laterality Date   BREAST BIOPSY Left 03/10/2021   [place] BX,ribbon clip, IMC   BREAST LUMPECTOMY,RADIO FREQ LOCALIZER,AXILLARY SENTINEL LYMPH NODE BIOPSY Left 04/09/2021   Procedure: BREAST LUMPECTOMY,RADIO FREQ LOCALIZER,AXILLARY SENTINEL LYMPH NODE BIOPSY;  Surgeon: Benjamine Sprague, DO;  Location: [facility] ORS;  Service: General;  Laterality: Left;  Social History   Socioeconomic History   Marital status: Single    Spouse name: Not on file   Number of children: Not on file   Years of education: Not on file   Highest education level: Not on file  Occupational History    Not on file  Tobacco Use   Smoking status: Never   Smokeless tobacco: Never  Vaping Use   Vaping Use: Never used  Substance and Sexual Activity   Alcohol use: Never   Drug use: Not Currently   Sexual activity: Not on file  Other Topics Concern   Not on file  Social History Narrative   Not on file   Social Determinants of Health   Financial Resource Strain: Not on file  Food Insecurity: Not on file  Transportation Needs: Not on file  Physical Activity: Not on file  Stress: Not on file  Social Connections: Not on file  Intimate Partner Violence: Not on file    History reviewed. No pertinent family history.   Current Outpatient Medications:    acetaminophen (TYLENOL) 325 MG tablet, Take 325-650 mg by mouth every 6 (six) hours as needed for moderate pain., Disp: , Rfl:    ethosuximide (ZARONTIN) 250 MG capsule, Take 250 mg by mouth 2 (two) times daily., Disp: , Rfl:    HYDROcodone-acetaminophen (NORCO) 5-325 MG tablet, Take 1 tablet by mouth every 6 (six) hours as needed for up to 6 doses for moderate pain., Disp: 6 tablet, Rfl: 0   omeprazole (PRILOSEC) 20 MG capsule, Take 1 capsule by mouth daily., Disp: , Rfl:    ondansetron (ZOFRAN) 8 MG tablet, Take 1 tablet (8 mg total) by mouth 2 (two) times daily as needed (Nausea or vomiting)., Disp: 30 tablet, Rfl: 1   potassium chloride SA (KLOR-CON M20) 20 MEQ tablet, Take 1 tablet (20 mEq total) by mouth daily., Disp: 7 tablet, Rfl: 0   tamoxifen (NOLVADEX) 20 MG tablet, TAKE 1 TABLET BY MOUTH EVERY DAY, Disp: 90 tablet, Rfl: 1   VITAMIN D PO, Take 1 capsule by mouth daily., Disp: , Rfl:   Physical exam:  Vitals:   05/06/22 1015  BP: 121/70  Pulse: 77  Resp: 20  Temp: 98.4 F (36.9 C)  TempSrc: Oral  Weight: 115 lb 6.4 oz (52.3 kg)  Height: 5' 2"  (1.575 m)   Physical Exam Vitals reviewed.  Constitutional:      Appearance: She is not ill-appearing.  Skin:    General: Skin is warm.     Coloration: Skin is not pale.      Findings: No bruising, erythema, lesion or rash.  Neurological:     Mental Status: She is alert.     Assessment and plan- Patient is a 46 y.o. female who returns to clinic for follow up post-trastuzumab extravasation. Classified as nonvesicant. No apparent tissue damage. Reviewed that if she experiences redness, pain, swelling to contact clinic.   Follow up with Dr. Tasia Catchings as scheduled or return to clinic sooner if needed.    Visit Diagnosis 1. Injection site extravasation    Patient expressed understanding and was in agreement with this plan. She also understands that She can call clinic at any time with any questions, concerns, or complaints.   Thank you for allowing me to participate in the care of this very pleasant patient.   Beckey Rutter, DNP, AGNP-C [facility]

## 2022-05-07 ENCOUNTER — Ambulatory Visit: Payer: Medicare Other

## 2022-05-07 ENCOUNTER — Ambulatory Visit: Payer: Medicare Other | Admitting: Oncology

## 2022-05-07 ENCOUNTER — Other Ambulatory Visit: Payer: Medicare Other

## 2022-08-04 ENCOUNTER — Inpatient Hospital Stay: Payer: Medicare Other | Attending: Oncology

## 2022-08-04 DIAGNOSIS — E876 Hypokalemia: Secondary | ICD-10-CM | POA: Diagnosis not present

## 2022-08-04 DIAGNOSIS — Z79810 Long term (current) use of selective estrogen receptor modulators (SERMs): Secondary | ICD-10-CM | POA: Insufficient documentation

## 2022-08-04 DIAGNOSIS — C50912 Malignant neoplasm of unspecified site of left female breast: Secondary | ICD-10-CM | POA: Insufficient documentation

## 2022-08-04 DIAGNOSIS — Z170 Estrogen receptor positive status [ER+]: Secondary | ICD-10-CM | POA: Insufficient documentation

## 2022-08-04 DIAGNOSIS — C50919 Malignant neoplasm of unspecified site of unspecified female breast: Secondary | ICD-10-CM

## 2022-08-04 LAB — COMPREHENSIVE METABOLIC PANEL
ALT: 16 U/L (ref 0–44)
AST: 27 U/L (ref 15–41)
Albumin: 4.1 g/dL (ref 3.5–5.0)
Alkaline Phosphatase: 52 U/L (ref 38–126)
Anion gap: 8 (ref 5–15)
BUN: 12 mg/dL (ref 6–20)
CO2: 24 mmol/L (ref 22–32)
Calcium: 9.6 mg/dL (ref 8.9–10.3)
Chloride: 104 mmol/L (ref 98–111)
Creatinine, Ser: 0.61 mg/dL (ref 0.44–1.00)
GFR, Estimated: >60 mL/min (ref >60)
Glucose, Bld: 95 mg/dL (ref 70–99)
Potassium: 3.4 mmol/L — ABNORMAL LOW (ref 3.5–5.1)
Sodium: 136 mmol/L (ref 135–145)
Total Bilirubin: 0.3 mg/dL (ref 0.3–1.2)
Total Protein: 7.9 g/dL (ref 6.5–8.1)

## 2022-08-04 LAB — CBC WITH DIFFERENTIAL/PLATELET
Abs Immature Granulocytes: 0 10*3/uL (ref 0.00–0.07)
Basophils Absolute: 0 10*3/uL (ref 0.0–0.1)
Basophils Relative: 1 %
Eosinophils Absolute: 0.2 10*3/uL (ref 0.0–0.5)
Eosinophils Relative: 4 %
HCT: 39.8 % (ref 36.0–46.0)
Hemoglobin: 13.1 g/dL (ref 12.0–15.0)
Immature Granulocytes: 0 %
Lymphocytes Relative: 41 %
Lymphs Abs: 2.2 10*3/uL (ref 0.7–4.0)
MCH: 30.5 pg (ref 26.0–34.0)
MCHC: 32.9 g/dL (ref 30.0–36.0)
MCV: 92.6 fL (ref 80.0–100.0)
Monocytes Absolute: 0.4 10*3/uL (ref 0.1–1.0)
Monocytes Relative: 7 %
Neutro Abs: 2.4 10*3/uL (ref 1.7–7.7)
Neutrophils Relative %: 47 %
Platelets: 266 10*3/uL (ref 150–400)
RBC: 4.3 MIL/uL (ref 3.87–5.11)
RDW: 12.6 % (ref 11.5–15.5)
WBC: 5.3 10*3/uL (ref 4.0–10.5)
nRBC: 0 % (ref 0.0–0.2)

## 2022-08-05 LAB — CANCER ANTIGEN 27.29: CA 27.29: 29.5 U/mL (ref 0.0–38.6)

## 2022-08-06 LAB — CANCER ANTIGEN 15-3: CA 15-3: 28.2 U/mL — ABNORMAL HIGH (ref 0.0–25.0)

## 2022-08-07 ENCOUNTER — Other Ambulatory Visit: Payer: Self-pay | Admitting: Oncology

## 2022-08-09 ENCOUNTER — Encounter: Payer: Self-pay | Admitting: Oncology

## 2022-08-09 ENCOUNTER — Other Ambulatory Visit: Payer: Self-pay

## 2022-08-09 ENCOUNTER — Inpatient Hospital Stay (HOSPITAL_BASED_OUTPATIENT_CLINIC_OR_DEPARTMENT_OTHER): Payer: Medicare Other | Admitting: Oncology

## 2022-08-09 VITALS — BP 120/78 | HR 76 | Temp 97.6°F | Wt 121.5 lb

## 2022-08-09 DIAGNOSIS — E876 Hypokalemia: Secondary | ICD-10-CM

## 2022-08-09 DIAGNOSIS — C50912 Malignant neoplasm of unspecified site of left female breast: Secondary | ICD-10-CM | POA: Diagnosis not present

## 2022-08-09 DIAGNOSIS — Z79810 Long term (current) use of selective estrogen receptor modulators (SERMs): Secondary | ICD-10-CM | POA: Insufficient documentation

## 2022-08-09 DIAGNOSIS — C50919 Malignant neoplasm of unspecified site of unspecified female breast: Secondary | ICD-10-CM

## 2022-08-09 MED ORDER — POTASSIUM CHLORIDE CRYS ER 20 MEQ PO TBCR: 20.0000 meq | EXTENDED_RELEASE_TABLET | Freq: Every day | ORAL | 0 refills | Status: DC

## 2022-08-09 NOTE — Assessment & Plan Note (Signed)
Recommend potassium 86mq daily for 1 week

## 2022-08-09 NOTE — Assessment & Plan Note (Addendum)
Left invasive carcinoma of breast,pT1c pN0 cM0 status postlumpectomy and sentinel lymph node biopsy- HER2 positive, ER 11-50% positive,  PR 1-10% positive.-,  s/p adjuvant chemotherapy Taxol weekly x12, finnish 1 year of maintenance trastuzumab.  Lab results reviewed and discussed with patient Proceed with maintenance trastuzumab today-last dose today. continue tamoxifen 20 mg daily.    # age of diagnosis is <50, recommend genetic testing.  recommend MRI breast #annual diagnostic mammogram

## 2022-08-09 NOTE — Progress Notes (Signed)
Hematology/Oncology Progress note [phone] [phone]      Patient Care Team: Kirk Ruths, MD as PCP - General (Internal Medicine) Earlie Server, MD as Consulting Physician (Oncology)  ASSESSMENT & PLAN:   Cancer Staging  Invasive carcinoma of breast [facility]) Staging form: Breast, AJCC 8th Edition - Pathologic stage from 04/22/2021: Stage IA (pT1c, pN0, cM0, G2, ER+, PR+, HER2+) - Signed by Earlie Server, MD on 04/22/2021   Invasive carcinoma of breast ([place]) Left invasive carcinoma of breast,pT1c pN0 cM0 status postlumpectomy and sentinel lymph node biopsy- HER2 positive, ER 11-50% positive,  PR 1-10% positive.-,  s/p adjuvant chemotherapy Taxol weekly x12, finnish 1 year of maintenance trastuzumab.  Lab results reviewed and discussed with patient Proceed with maintenance trastuzumab today-last dose today. continue tamoxifen 20 mg daily.    # age of diagnosis is <50, recommend genetic testing.  recommend MRI breast #annual diagnostic mammogram  Long-term current use of tamoxifen Recommend annual gynecology evaluation for pelvic examination-[last seen by gynecology Dr. Leafy Ro on 12/03/2021].  Hypokalemia Recommend potassium 25mq daily for 1 week Orders Placed This Encounter  Procedures   [facility] BRE[facility]ILATERAL [facility]    Standing Status:   Future    Standing Expiration Date:   08/10/2023    Order Specific Question:   If indicated for the ordered procedure, I authorize the administration of contrast media per Radiology protocol    Answer:   Yes    Order Specific Question:   What is the patient's sedation requirement?    Answer:   No Sedation    Order Specific Question:   Does the patient have a pacemaker or implanted devices?    Answer:   No    Order Specific Question:   Radiology Contrast Protocol - do NOT remove file path    Answer:   [email or web address]    Order Specific Question:   Preferred [facility]  [facility]?    [facility](table limit - [facility])   Cancer antigen 15-3    Standing Status:   Future    Standing Expiration Date:   08/10/2023   Cancer antigen 27.29    Standing Status:   Future    Standing Expiration Date:   08/10/2023   CBC with Differential/Platelet    Standing Status:   Future    Standing Expiration Date:   08/10/2023   Comprehensive metabolic panel    Standing Status:   Future    Standing Expiration Date:   08/09/2023    [place] interpreter service was utilized during the entire encounter.   Follow up in 4 months All questions were answered. The patient knows to call the clinic with any problems, questions or concerns.  ZEarlie Server MD, PhD [facility] Oncology 08/09/2022       CHIEF COMPLAINTS/REASON FOR VISIT:  Follow up for treatment of breast cancer  HISTORY OF PRESENTING ILLNESS:   Anna Banks a  46y.o.  female presents for HER2 positive breast cancer.  Oncology History  [place]  03/23/2021 Initial Diagnosis   Invasive carcinoma of breast  Menarche 46years of age. G0P0. No child, no previous pregnancy. Per her sister, patient is not sexually active No prior use of birth control pills or hormone replacement therapy. Denies previous history of biopsy.   02/16/2021, screening mammogram showed calcification in the right breast needs further evaluation.  Left breast possible mass with distortion requires further evaluation. 02/26/2021,  left breast showed a 10 mm indeterminate mass, 2:00, 8 cm from nipple.  No mammographic evidence of malignancy in the right breast.  No left axillary lymphadenopathy. 03/10/2021, patient underwent ultrasound-guided biopsy of left breast mass.  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  ER low positive [1-10%], PR positive [1-10%], HER2 positive by IHC 3+   # 04/09/2021,  status postlumpectomy and sentinel lymph node biopsy. Invasive mammary carcinoma, no special type, 4 lymph nodes all negative for metastatic carcinoma.  DCIS present.  LVI not identified.Grade 2, all margins negative for invasive carcinoma.pT1c pN0, ER [staining was repeated on surgical specimen] [11-50%]+ PR [1-10%]+, HER 2+     04/22/2021 Cancer Staging   Staging form: Breast, AJCC 8th Edition - Pathologic stage from 04/22/2021: Stage IA (pT1c, pN0, cM0, G2, ER+, PR+, HER2+) - Signed by Earlie Server, MD on 04/22/2021 Stage prefix: Initial diagnosis Multigene prognostic tests performed: None Histologic grading system: 3 grade system   05/08/2021 - 05/04/2022 Chemotherapy   Adjuvant chemotherapy Paclitaxel + Trastuzumab q7d / Trastuzumab q21d      10/01/2021 -  Radiation Therapy    adjuvant radiation of the breast   10/15/2021 -  Anti-estrogen oral therapy   started on tamoxifen 20 mg daily.   02/15/2022 Echocardiogram   Left ventricular ejection fraction, by estimation, is 60 to 65%.   03/10/2022 Mammogram   Bilateral diagnostic mammogram showed No mammographic evidence of malignancy bilaterally.    INTERVAL HISTORY Anna Banks is a 46 y.o. female who has above history reviewed by me today presents for follow up visit for chemotherapy for HER2 positive breast cancer  Tolerates tamoxifen 20 mg daily.  Patient has no new complaints.   Review of Systems  Unable to perform ROS: Other (intellectual/Cognitive impairment)  Constitutional:  Negative for appetite change, chills, fatigue and fever.  HENT:   Negative for hearing loss and voice change.   Eyes:  Negative for eye problems.  Respiratory:  Negative for chest tightness and cough.   Cardiovascular:  Negative for chest pain.  Gastrointestinal:  Negative for abdominal distention, abdominal pain, blood in stool, nausea and vomiting.  Endocrine: Negative for hot flashes.  Genitourinary:  Negative for difficulty urinating and frequency.    Musculoskeletal:  Negative for arthralgias.  Skin:  Negative for itching and rash.  Neurological:  Negative for extremity weakness.  Hematological:  Negative for adenopathy.  Psychiatric/Behavioral:  Negative for confusion.     MEDICAL HISTORY:  Past Medical History:  Diagnosis Date   Down syndrome    Seizure [facility])     SURGICAL HISTORY: Past Surgical History:  Procedure Laterality Date   BREAST BIOPSY Left 03/10/2021   [place] BX,ribbon clip, IMC   BREAST LUMPECTOMY,RADIO FREQ LOCALIZER,AXILLARY SENTINEL LYMPH NODE BIOPSY Left 04/09/2021   Procedure: BREAST LUMPECTOMY,RADIO FREQ LOCALIZER,AXILLARY SENTINEL LYMPH NODE BIOPSY;  Surgeon: Benjamine Sprague, DO;  Location: [facility] ORS;  Service: General;  Laterality: Left;    SOCIAL HISTORY: Social History   Socioeconomic History   Marital status: Single    Spouse name: Not on file   Number of children: Not on file   Years of education: Not on file   Highest education level: Not on file  Occupational History   Not on file  Tobacco Use   Smoking status: Never   Smokeless tobacco: Never  Vaping Use   Vaping Use: Never used  Substance and Sexual Activity   Alcohol use:  Never   Drug use: Not Currently   Sexual activity: Not on file  Other Topics Concern   Not on file  Social History Narrative   Not on file   Social Determinants of Health   Financial Resource Strain: Not on file  Food Insecurity: Not on file  Transportation Needs: Not on file  Physical Activity: Not on file  Stress: Not on file  Social Connections: Not on file  Intimate Partner Violence: Not on file    FAMILY HISTORY: History reviewed. No pertinent family history.  ALLERGIES:  has No Known Allergies.  MEDICATIONS:  Current Outpatient Medications  Medication Sig Dispense Refill   acetaminophen (TYLENOL) 325 MG tablet Take 325-650 mg by mouth every 6 (six) hours as needed for moderate pain.     ethosuximide (ZARONTIN) 250 MG capsule Take by mouth.      HYDROcodone-acetaminophen (NORCO) 5-325 MG tablet Take 1 tablet by mouth every 6 (six) hours as needed for up to 6 doses for moderate pain. 6 tablet 0   omeprazole (PRILOSEC) 20 MG capsule Take 1 capsule by mouth daily.     ondansetron (ZOFRAN) 8 MG tablet Take 1 tablet (8 mg total) by mouth 2 (two) times daily as needed (Nausea or vomiting). 30 tablet 1   tamoxifen (NOLVADEX) 20 MG tablet TAKE 1 TABLET BY MOUTH EVERY DAY 90 tablet 1   VITAMIN D PO Take 1 capsule by mouth daily.     potassium chloride SA (KLOR-CON M20) 20 MEQ tablet Take 1 tablet (20 mEq total) by mouth daily. 7 tablet 0   No current facility-administered medications for this visit.     PHYSICAL EXAMINATION: ECOG PERFORMANCE STATUS: 0 - Asymptomatic Vitals:   08/09/22 1307  BP: 120/78  Pulse: 76  Temp: 97.6 F (36.4 C)  SpO2: 100%   Filed Weights   08/09/22 1307  Weight: 121 lb 8 oz (55.1 kg)    Physical Exam Constitutional:      General: She is not in acute distress. HENT:     Head: Normocephalic and atraumatic.  Eyes:     General: No scleral icterus. Cardiovascular:     Rate and Rhythm: Regular rhythm.     Heart sounds: Normal heart sounds.  Pulmonary:     Effort: Pulmonary effort is normal. No respiratory distress.     Breath sounds: No wheezing.  Abdominal:     General: Bowel sounds are normal. There is no distension.     Palpations: Abdomen is soft.  Musculoskeletal:        General: No deformity. Normal range of motion.     Cervical back: Normal range of motion and neck supple.  Skin:    General: Skin is warm and dry.     Findings: No erythema or rash.  Neurological:     Mental Status: She is alert. Mental status is at baseline.     Cranial Nerves: No cranial nerve deficit.     Coordination: Coordination normal.  Psychiatric:        Mood and Affect: Mood normal.    .   LABORATORY DATA:  I have reviewed the data as listed Lab Results  Component Value Date   WBC 5.3 08/04/2022   HGB  13.1 08/04/2022   HCT 39.8 08/04/2022   MCV 92.6 08/04/2022   PLT 266 08/04/2022   Recent Labs    04/09/22 0951 05/04/22 0919 08/04/22 1130  NA 140 139 136  K 3.7 3.4* 3.4*  CL 106 105 104  CO2 _0 GLUCOSE 93 83 95  BUN _1 CREATININE 0.74 0.69 0.61  CALCIUM 9.7 9.7 9.6  GFRNONAA >60 >60 >60  PROT 7.8 8.4* 7.9  ALBUMIN 3.9 4.3 4.1  AST _2 ALT _3 ALKPHOS 49 47 52  BILITOT 0.5 0.4 0.3    Iron/TIBC/Ferritin/ %Sat No results found for: "IRON", "TIBC", "FERRITIN", "IRONPCTSAT"    RADIOGRAPHIC STUDIES: I have personally reviewed the radiological images as listed and agreed with the findings in the report. No results found.

## 2022-08-09 NOTE — Assessment & Plan Note (Signed)
Recommend annual gynecology evaluation for pelvic examination-[last seen by gynecology Dr. Leafy Ro on 12/03/2021].

## 2022-08-18 ENCOUNTER — Encounter: Payer: Medicare Other | Admitting: Licensed Clinical Social Worker

## 2022-09-14 ENCOUNTER — Inpatient Hospital Stay: Payer: Medicare Other | Attending: Oncology | Admitting: Licensed Clinical Social Worker

## 2022-09-14 ENCOUNTER — Encounter: Payer: Self-pay | Admitting: Licensed Clinical Social Worker

## 2022-09-14 ENCOUNTER — Inpatient Hospital Stay: Payer: Medicare Other

## 2022-09-14 DIAGNOSIS — C50919 Malignant neoplasm of unspecified site of unspecified female breast: Secondary | ICD-10-CM

## 2022-09-14 NOTE — Progress Notes (Signed)
REFERRING PROVIDER: Yu, Zhou, MD [address] [place],  [place] [address]  PRIMARY PROVIDER:  Anderson, Marshall W, MD  PRIMARY REASON FOR VISIT:  1. Invasive carcinoma of breast (H[facility])      HISTORY OF PRESENT ILLNESS:   Anna Banks, a 47 y.o. female, was seen for a [facility] cancer genetics consultation at the request of Dr. Yu due to a personal history of breast cancer.  Anna Banks presents to clinic today to discuss the possibility of a hereditary predisposition to cancer, genetic testing, and to further clarify her future cancer risks, as well as potential cancer risks for family members.   In 2022, at the age of 47, Anna Banks was diagnosed with invasive cancer of the left breast. The treatment plan included left lumpectomy, adjuvant chemotherapy, adjuvant radiation and tamoxifen.   CANCER HISTORY:  Oncology History  Invasive carcinoma of breast ([facility])  03/23/2021 Initial Diagnosis   Invasive carcinoma of breast  Menarche 47 years of age. G0P0. No child, no previous pregnancy. Per her sister, patient is not sexually active No prior use of birth control pills or hormone replacement therapy. Denies previous history of biopsy.   02/16/2021, screening mammogram showed calcification in the right breast needs further evaluation.  Left breast possible mass with distortion requires further evaluation. 02/26/2021, left breast showed a 10 mm indeterminate mass, 2:00, 8 cm from nipple.  No mammographic evidence of malignancy in the right breast.  No left axillary lymphadenopathy. 03/10/2021, patient underwent ultrasound-guided biopsy of left breast mass.  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  ER low positive [1-10%], PR positive [1-10%], HER2 positive by IHC 3+   # 04/09/2021, status postlumpectomy and sentinel lymph node biopsy. Invasive mammary carcinoma, no special type, 4 lymph nodes all negative for metastatic  carcinoma.  DCIS present.  LVI not identified.Grade 2, all margins negative for invasive carcinoma.pT1c pN0, ER [staining was repeated on surgical specimen] [11-50%]+ PR [1-10%]+, HER 2+     04/22/2021 Cancer Staging   Staging form: Breast, AJCC 8th Edition - Pathologic stage from 04/22/2021: Stage IA (pT1c, pN0, cM0, G2, ER+, PR+, HER2+) - Signed by Yu, Zhou, MD on 04/22/2021 Stage prefix: Initial diagnosis Multigene prognostic tests performed: None Histologic grading system: 3 grade system   05/08/2021 - 05/04/2022 Chemotherapy   Adjuvant chemotherapy Paclitaxel + Trastuzumab q7d / Trastuzumab q21d      10/01/2021 -  Radiation Therapy    adjuvant radiation of the breast   10/15/2021 -  Anti-estrogen oral therapy   started on tamoxifen 20 mg daily.   02/15/2022 Echocardiogram   Left ventricular ejection fraction, by estimation, is 60 to 65%.   03/10/2022 Mammogram   Bilateral diagnostic mammogram showed No mammographic evidence of malignancy bilaterally.    RISK FACTORS:  Menarche was at age 16.  OCP use for approximately 0 years. Ovaries intact: yes.  Hysterectomy: no.  Menopausal status: premenopausal.  HRT use: 0 years. Colonoscopy: no; not examined.   Past Medical History:  Diagnosis Date   Down syndrome    Seizure (HCC)     Past Surgical History:  Procedure Laterality Date   BREAST BIOPSY Left 03/10/2021   US BX,ribbon clip, IMC   BREAST LUMPECTOMY,RADIO FREQ LOCALIZER,AXILLARY SENTINEL LYMPH NODE BIOPSY Left 04/09/2021   Procedure: BREAST LUMPECTOMY,RADIO FREQ LOCALIZER,AXILLARY SENTINEL LYMPH NODE BIOPSY;  Surgeon: Sakai, Isami, DO;  Location: [facility] ORS;  Service: General;  Laterality: Left;      FAMILY HISTORY:  We obtained a detailed, 4-generation family history.  Significant diagnoses are listed below: No family history on file.  Anna Banks has 5 older sisters and 1 older brother, no known cancers.   Anna Banks's mother is living at 82. She has 1 brother, 1 sister, no  cancers. Patient has no information about maternal grandparents.  Anna Banks has no information about paternal relatives.  Anna Banks is unaware of previous family history of genetic testing for hereditary cancer risks. There is no reported Ashkenazi Jewish ancestry. There is no known consanguinity.    GENETIC COUNSELING ASSESSMENT: Anna Banks is a 47 y.o. female with a personal history of breast cancer which is somewhat suggestive of a hereditary cancer syndrome and predisposition to cancer. We, therefore, discussed and recommended the following at today's visit.   DISCUSSION: We discussed that approximately 10% of breast cancer is hereditary. Most cases of hereditary breast cancer are associated with BRCA1/BRCA2 genes, although there are other genes associated with hereditary cancer as well. Cancers and risks are gene specific. We discussed that testing is beneficial for several reasons including knowing about cancer risks, identifying potential screening and risk-reduction options that may be appropriate, and to understand if other family members could be at risk for cancer and allow them to undergo genetic testing.   We reviewed the characteristics, features and inheritance patterns of hereditary cancer syndromes. We also discussed genetic testing, including the appropriate family members to test, the process of testing, insurance coverage and turn-around-time for results. We discussed the implications of a negative, positive and/or variant of uncertain significant result. We recommended Anna Banks pursue genetic testing for the [facility] Common Hereditary Cancers+RNA gene panel.   Based on Anna Banks's personal history of cancer, she meets medical criteria for genetic testing. Despite that she meets criteria, she may still have an out of pocket cost. We discussed that if her out of pocket cost for testing is over $100, the laboratory will call and confirm whether she wants to proceed with testing.  If the out of  pocket cost of testing is less than $100 she will be billed by the genetic testing laboratory.   PLAN: After considering the risks, benefits, and limitations, Anna Banks provided informed consent to pursue genetic testing and the blood sample was sent to [facility] for analysis of the Common Hereditary Cancers+RNA panel. Results should be available within approximately 2-3 weeks' time, at which point they will be disclosed by telephone to Anna Banks, as will any additional recommendations warranted by these results. Anna Banks will receive a summary of her genetic counseling visit and a copy of her results once available. This information will also be available in Epic.   Anna Banks's questions were answered to her satisfaction today. Our contact information was provided should additional questions or concerns arise. Thank you for the referral and allowing us to share in the care of your patient.   Brianna Cowan, MS, LCGC Genetic Counselor [email or web address] Phone: [phone]  The patient was seen for a total of 25 minutes in face-to-face genetic counseling.  Patient was seen with Vietnamese interpreter. Dr. Finnegan was available for discussion regarding this case.   _______________________________________________________________________ For Office Staff:  Number of people involved in session: 2 Was an Intern/ student involved with case: no  

## 2022-09-21 ENCOUNTER — Ambulatory Visit
Admission: RE | Admit: 2022-09-21 | Discharge: 2022-09-21 | Disposition: A | Discharge status: Home / Self Care | Payer: Medicare Other | Source: Ambulatory Visit | Attending: Oncology | Admitting: Oncology

## 2022-09-21 DIAGNOSIS — C50919 Malignant neoplasm of unspecified site of unspecified female breast: Secondary | ICD-10-CM

## 2022-09-21 MED ORDER — GADOBUTROL 1 MMOL/ML IV SOLN
5.0000 mL | Freq: Once | INTRAVENOUS | Status: AC | PRN
  Administered 2022-09-21: 5 mL via INTRAVENOUS

## 2022-09-24 ENCOUNTER — Telehealth: Payer: Self-pay | Admitting: Licensed Clinical Social Worker

## 2022-10-21 ENCOUNTER — Encounter: Payer: Self-pay | Admitting: Licensed Clinical Social Worker

## 2022-10-21 ENCOUNTER — Ambulatory Visit: Payer: Self-pay | Admitting: Licensed Clinical Social Worker

## 2022-10-21 DIAGNOSIS — Z1379 Encounter for other screening for genetic and chromosomal anomalies: Secondary | ICD-10-CM | POA: Insufficient documentation

## 2022-10-21 NOTE — Progress Notes (Signed)
HPI:   [name] was previously seen in the [place] clinic due to a personal and family history of cancer and concerns regarding a hereditary predisposition to cancer. Please refer to our prior cancer genetics clinic note for more information regarding our discussion, assessment and recommendations, at the time. [name] recent genetic test results were disclosed to her, as were recommendations warranted by these results. These results and recommendations are discussed in more detail below.  CANCER HISTORY:  Oncology History  Invasive carcinoma of breast ([place])  03/23/2021 Initial Diagnosis   Invasive carcinoma of breast  Menarche 47 years of age. G0P0. No child, no previous pregnancy. Per her sister, patient is not sexually active No prior use of birth control pills or hormone replacement therapy. Denies previous history of biopsy.   02/16/2021, screening mammogram showed calcification in the right breast needs further evaluation.  Left breast possible mass with distortion requires further evaluation. 02/26/2021, left breast showed a 10 mm indeterminate mass, 2:00, 8 cm from nipple.  No mammographic evidence of malignancy in the right breast.  No left axillary lymphadenopathy. 03/10/2021, patient underwent ultrasound-guided biopsy of left breast mass.  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  ER low positive [1-10%], PR positive [1-10%], HER2 positive by IHC 3+   # 04/09/2021, status postlumpectomy and sentinel lymph node biopsy. Invasive mammary carcinoma, no special type, 4 lymph nodes all negative for metastatic carcinoma.  DCIS present.  LVI not identified.Grade 2, all margins negative for invasive carcinoma.pT1c pN0, ER [staining was repeated on surgical specimen] [11-50%]+ PR [1-10%]+, HER 2+     04/22/2021 Cancer Staging   Staging form: Breast, AJCC 8th Edition - Pathologic stage from  04/22/2021: Stage IA (pT1c, pN0, cM0, G2, ER+, PR+, HER2+) - Signed by Earlie Server, MD on 04/22/2021 Stage prefix: Initial diagnosis Multigene prognostic tests performed: None Histologic grading system: 3 grade system   05/08/2021 - 05/04/2022 Chemotherapy   Adjuvant chemotherapy Paclitaxel + Trastuzumab q7d / Trastuzumab q21d      10/01/2021 -  Radiation Therapy    adjuvant radiation of the breast   10/15/2021 -  Anti-estrogen oral therapy   started on tamoxifen 20 mg daily.   02/15/2022 Echocardiogram   Left ventricular ejection fraction, by estimation, is 60 to 65%.   03/10/2022 Mammogram   Bilateral diagnostic mammogram showed No mammographic evidence of malignancy bilaterally.    Genetic Testing   Negative genetic testing. No pathogenic variants identified on the [facility] Common Hereditary Cancers+RNA panel. The report date is 09/21/2022.  The Common Hereditary Cancers Panel + RNA offered by [facility] includes sequencing and/or deletion duplication testing of the following 48 genes: APC*, ATM*, AXIN2, BAP1, BARD1, BMPR1A, BRCA1, BRCA2, BRIP1, CDH1, CDK4, CDKN2A (p14ARF), CDKN2A (p16INK4a), CHEK2, CTNNA1, DICER1*, EPCAM*, FH*, GREM1*, HOXB13, KIT, MBD4, MEN1*, MLH1*, MSH2*, MSH3*, MSH6*, MUTYH, NF1*, NTHL1, PALB2, PDGFRA, PMS2*, POLD1*, POLE, PTEN*, RAD51C, RAD51D, SDHA*, SDHB, SDHC*, SDHD, SMAD4, SMARCA4, STK11, TP53, TSC1*, TSC2, VHL.      FAMILY HISTORY:  We obtained a detailed, 4-generation family history.  Significant diagnoses are listed below: No family history on file.  [name] has 5 older sisters and 1 older brother, no known cancers.    [name] mother is living at 55. She has 1 brother, 1 sister, no cancers. Patient has no information about maternal grandparents.   [name] has no information about paternal relatives.   [name] is unaware  of previous family history of genetic testing for hereditary cancer risks. There is no reported Ashkenazi Jewish ancestry. There is no known  consanguinity.     GENETIC TEST RESULTS:  The [facility] Common Hereditary Cancers+RNA Panel found no pathogenic mutations.   The Common Hereditary Cancers Panel + RNA offered by [facility] includes sequencing and/or deletion duplication testing of the following 48 genes: APC*, ATM*, AXIN2, BAP1, BARD1, BMPR1A, BRCA1, BRCA2, BRIP1, CDH1, CDK4, CDKN2A (p14ARF), CDKN2A (p16INK4a), CHEK2, CTNNA1, DICER1*, EPCAM*, FH*, GREM1*, HOXB13, KIT, MBD4, MEN1*, MLH1*, MSH2*, MSH3*, MSH6*, MUTYH, NF1*, NTHL1, PALB2, PDGFRA, PMS2*, POLD1*, POLE, PTEN*, RAD51C, RAD51D, SDHA*, SDHB, SDHC*, SDHD, SMAD4, SMARCA4, STK11, TP53, TSC1*, TSC2, VHL.   The test report has been scanned into EPIC and is located under the Molecular Pathology section of the Results Review tab.  A portion of the result report is included below for reference. Genetic testing reported out on 09/21/2022.     Even though a pathogenic variant was not identified, possible explanations for the cancer in the family may include: There may be no hereditary risk for cancer in the family. The cancers in [name] and/or her family may be sporadic/familial or due to other genetic and environmental factors. There may be a gene mutation in one of these genes that current testing methods cannot detect but that chance is small. There could be another gene that has not yet been discovered, or that we have not yet tested, that is responsible for the cancer diagnoses in the family.  It is also possible there is a hereditary cause for the cancer in the family that [name] did not inherit.  Therefore, it is important to remain in touch with cancer genetics in the future so that we can continue to offer [name] the most up to date genetic testing.   ADDITIONAL GENETIC TESTING:  We discussed with [name] that her genetic testing was fairly extensive.  If there are additional relevant genes identified to increase cancer risk that can be analyzed in the future, we would be  happy to discuss and coordinate this testing at that time.    CANCER SCREENING RECOMMENDATIONS:  [name] test result is considered negative (normal).  This means that we have not identified a hereditary cause for her personal history of cancer at this time.   An individual's cancer risk and medical management are not determined by genetic test results alone. Overall cancer risk assessment incorporates additional factors, including personal medical history, family history, and any available genetic information that may result in a personalized plan for cancer prevention and surveillance. Therefore, it is recommended she continue to follow the cancer management and screening guidelines provided by her oncology and primary healthcare provider.  RECOMMENDATIONS FOR FAMILY MEMBERS:   Individuals in this family might be at some increased risk of developing cancer, over the general population risk, due to the family history of cancer.  Individuals in the family should notify their providers of the family history of cancer. We recommend women in this family have a yearly mammogram beginning at age 40, or 74 years younger than the earliest onset of cancer, an annual clinical breast exam, and perform monthly breast self-exams.  Family members should have colonoscopies by at age 31, or earlier, as recommended by their providers.  FOLLOW-UP:  Lastly, we discussed with [name] that cancer genetics is a rapidly advancing field and it is possible that new genetic tests will be appropriate for her and/or her family members in  the future. We encouraged her to remain in contact with cancer genetics on an annual basis so we can update her personal and family histories and let her know of advances in cancer genetics that may benefit this family.   Our contact number was provided. [name] questions were answered to her satisfaction, and she knows she is welcome to call us at anytime with additional questions or  concerns.    Faith Rogue, MS, [facility] Genetic Counselor [email or web address] Phone: [phone]

## 2022-10-21 NOTE — Telephone Encounter (Signed)
Attempted to reach patient and her sister x3 with genetic test results. No response. Mailed results/result note to patient.

## 2022-10-28 ENCOUNTER — Ambulatory Visit
Admission: RE | Admit: 2022-10-28 | Discharge: 2022-10-28 | Disposition: A | Discharge status: Home / Self Care | Payer: Medicare Other | Source: Ambulatory Visit | Attending: Radiation Oncology | Admitting: Radiation Oncology

## 2022-10-28 ENCOUNTER — Encounter: Payer: Self-pay | Admitting: Radiation Oncology

## 2022-10-28 VITALS — BP 120/79 | HR 104 | Temp 98.3°F | Resp 14 | Ht 62.0 in | Wt 127.3 lb

## 2022-10-28 DIAGNOSIS — Z79810 Long term (current) use of selective estrogen receptor modulators (SERMs): Secondary | ICD-10-CM | POA: Diagnosis not present

## 2022-10-28 DIAGNOSIS — C50412 Malignant neoplasm of upper-outer quadrant of left female breast: Secondary | ICD-10-CM | POA: Insufficient documentation

## 2022-10-28 DIAGNOSIS — Z170 Estrogen receptor positive status [ER+]: Secondary | ICD-10-CM | POA: Insufficient documentation

## 2022-10-28 DIAGNOSIS — Z923 Personal history of irradiation: Secondary | ICD-10-CM | POA: Insufficient documentation

## 2022-10-28 DIAGNOSIS — C50919 Malignant neoplasm of unspecified site of unspecified female breast: Secondary | ICD-10-CM

## 2022-10-28 NOTE — Progress Notes (Signed)
Radiation Oncology Follow up Note  Name: Anna Banks   Date:   10/28/2022 MRN:  [id number] DOB: [birth year]    This 47 y.o. female presents to the clinic today for 1 year follow-up status post whole breast radiation to her left breast for stage Ia ER low PR positive HER2/neu overexpressed invasive mammary carcinoma.  REFERRING PROVIDER: Kirk Ruths, MD  HPI: Patient is a 47 year old female now out 1 year having completed whole breast radiation to her left breast for stage Ia invasive mammary carcinoma who HER2/neu overexpressed seen today in routine follow-up she is doing well.  She specifically denies breast tenderness cough or bone pain..  She had an MRI of her breast back in January show I reviewed which is BI-RADS 2 benign.  She is currently on tamoxifen tolerating it well without side effect.  COMPLICATIONS OF TREATMENT: none  FOLLOW UP COMPLIANCE: keeps appointments   PHYSICAL EXAM:  BP 120/79   Pulse (!) 104   Temp 98.3 F (36.8 C)   Resp 14   Ht 5' 2"$  (1.575 m)   Wt 127 lb 4.8 oz (57.7 kg)   BMI 23.28 kg/m  Lungs are clear to A&P cardiac examination essentially unremarkable with regular rate and rhythm. No dominant mass or nodularity is noted in either breast in 2 positions examined. Incision is well-healed. No axillary or supraclavicular adenopathy is appreciated. Cosmetic result is excellent.  Well-developed well-nourished patient in NAD. HEENT reveals PERLA, EOMI, discs not visualized.  Oral cavity is clear. No oral mucosal lesions are identified. Neck is clear without evidence of cervical or supraclavicular adenopathy. Lungs are clear to A&P. Cardiac examination is essentially unremarkable with regular rate and rhythm without murmur rub or thrill. Abdomen is benign with no organomegaly or masses noted. Motor sensory and DTR levels are equal and symmetric in the upper and lower extremities. Cranial nerves II through XII are grossly intact. Proprioception is intact.  No peripheral adenopathy or edema is identified. No motor or sensory levels are noted. Crude visual fields are within normal range.  RADIOLOGY RESULTS: MRI of breast reviewed compatible with above-stated findings  PLAN: Time patient is doing well out 1 year with no evidence of disease.  On pleased with her overall progress.  Of asked to see her back in 1 year for follow-up.  Patient is to call with any concerns.  I would like to take this opportunity to thank you for allowing me to participate in the care of your patient.Noreene Filbert, MD

## 2022-12-06 ENCOUNTER — Other Ambulatory Visit: Payer: Medicare Other

## 2022-12-07 ENCOUNTER — Inpatient Hospital Stay: Payer: Medicare Other | Attending: Oncology

## 2022-12-07 DIAGNOSIS — Z170 Estrogen receptor positive status [ER+]: Secondary | ICD-10-CM | POA: Insufficient documentation

## 2022-12-07 DIAGNOSIS — C50912 Malignant neoplasm of unspecified site of left female breast: Secondary | ICD-10-CM | POA: Diagnosis present

## 2022-12-07 DIAGNOSIS — C50919 Malignant neoplasm of unspecified site of unspecified female breast: Secondary | ICD-10-CM

## 2022-12-07 DIAGNOSIS — Z79810 Long term (current) use of selective estrogen receptor modulators (SERMs): Secondary | ICD-10-CM | POA: Diagnosis not present

## 2022-12-07 LAB — CBC WITH DIFFERENTIAL/PLATELET
Abs Immature Granulocytes: 0.01 10*3/uL (ref 0.00–0.07)
Basophils Absolute: 0 10*3/uL (ref 0.0–0.1)
Basophils Relative: 1 %
Eosinophils Absolute: 0.2 10*3/uL (ref 0.0–0.5)
Eosinophils Relative: 2 %
HCT: 39.6 % (ref 36.0–46.0)
Hemoglobin: 13 g/dL (ref 12.0–15.0)
Immature Granulocytes: 0 %
Lymphocytes Relative: 35 %
Lymphs Abs: 2.2 10*3/uL (ref 0.7–4.0)
MCH: 30.7 pg (ref 26.0–34.0)
MCHC: 32.8 g/dL (ref 30.0–36.0)
MCV: 93.6 fL (ref 80.0–100.0)
Monocytes Absolute: 0.5 10*3/uL (ref 0.1–1.0)
Monocytes Relative: 8 %
Neutro Abs: 3.5 10*3/uL (ref 1.7–7.7)
Neutrophils Relative %: 54 %
Platelets: 300 10*3/uL (ref 150–400)
RBC: 4.23 MIL/uL (ref 3.87–5.11)
RDW: 13.2 % (ref 11.5–15.5)
WBC: 6.4 10*3/uL (ref 4.0–10.5)
nRBC: 0 % (ref 0.0–0.2)

## 2022-12-07 LAB — COMPREHENSIVE METABOLIC PANEL
ALT: 19 U/L (ref 0–44)
AST: 30 U/L (ref 15–41)
Albumin: 4 g/dL (ref 3.5–5.0)
Alkaline Phosphatase: 56 U/L (ref 38–126)
Anion gap: 9 (ref 5–15)
BUN: 10 mg/dL (ref 6–20)
CO2: 23 mmol/L (ref 22–32)
Calcium: 9.2 mg/dL (ref 8.9–10.3)
Chloride: 103 mmol/L (ref 98–111)
Creatinine, Ser: 0.57 mg/dL (ref 0.44–1.00)
GFR, Estimated: >60 mL/min (ref >60)
Glucose, Bld: 100 mg/dL — ABNORMAL HIGH (ref 70–99)
Potassium: 3.7 mmol/L (ref 3.5–5.1)
Sodium: 135 mmol/L (ref 135–145)
Total Bilirubin: 0.3 mg/dL (ref 0.3–1.2)
Total Protein: 7.7 g/dL (ref 6.5–8.1)

## 2022-12-08 ENCOUNTER — Ambulatory Visit: Payer: Medicare Other | Admitting: Oncology

## 2022-12-08 LAB — CANCER ANTIGEN 27.29: CA 27.29: 24 U/mL (ref 0.0–38.6)

## 2022-12-09 ENCOUNTER — Encounter: Payer: Self-pay | Admitting: Oncology

## 2022-12-09 ENCOUNTER — Inpatient Hospital Stay (HOSPITAL_BASED_OUTPATIENT_CLINIC_OR_DEPARTMENT_OTHER): Payer: Medicare Other | Admitting: Oncology

## 2022-12-09 VITALS — BP 120/88 | HR 116 | Temp 98.1°F

## 2022-12-09 DIAGNOSIS — C50919 Malignant neoplasm of unspecified site of unspecified female breast: Secondary | ICD-10-CM | POA: Diagnosis not present

## 2022-12-09 DIAGNOSIS — Z853 Personal history of malignant neoplasm of breast: Secondary | ICD-10-CM

## 2022-12-09 DIAGNOSIS — Z1231 Encounter for screening mammogram for malignant neoplasm of breast: Secondary | ICD-10-CM | POA: Diagnosis not present

## 2022-12-09 DIAGNOSIS — Z79810 Long term (current) use of selective estrogen receptor modulators (SERMs): Secondary | ICD-10-CM

## 2022-12-09 DIAGNOSIS — C50912 Malignant neoplasm of unspecified site of left female breast: Secondary | ICD-10-CM | POA: Diagnosis not present

## 2022-12-09 LAB — CANCER ANTIGEN 15-3: CA 15-3: 26.7 U/mL — ABNORMAL HIGH (ref 0.0–25.0)

## 2022-12-09 MED ORDER — TAMOXIFEN CITRATE 20 MG PO TABS: 20.0000 mg | ORAL_TABLET | Freq: Every day | ORAL | 3 refills | Status: DC

## 2022-12-09 NOTE — Assessment & Plan Note (Signed)
Continue follow up with Gyn for annual pelvic examination-[last seen by gynecology Dr. Leafy Ro on 12/03/2021].

## 2022-12-09 NOTE — Assessment & Plan Note (Addendum)
Left invasive carcinoma of breast,pT1c pN0 cM0 status postlumpectomy and sentinel lymph node biopsy- HER2 positive, ER 11-50% positive,  PR 1-10% positive.-,  s/p adjuvant chemotherapy Taxol weekly x12, finnish 1 year of maintenance trastuzumab.  Genetic testing negative.  Lab results reviewed and discussed with patient continue tamoxifen 20 mg daily.   Continue annual MRI breasts and annual mammogram

## 2022-12-09 NOTE — Progress Notes (Addendum)
Hematology/Oncology Progress note Telephone:([phone] Fax:([phone]     CHIEF COMPLAINTS/REASON FOR VISIT:  Follow up for left Triple positive breast cancer   ASSESSMENT & PLAN:   Cancer Staging  Invasive carcinoma of breast ([place]) Staging form: Breast, AJCC 8th Edition - Pathologic stage from 04/22/2021: Stage IA (pT1c, pN0, cM0, G2, ER+, PR+, HER2+) - Signed by Earlie Server, MD on 04/22/2021   Invasive carcinoma of breast ([place]) Left invasive carcinoma of breast,pT1c pN0 cM0 status postlumpectomy and sentinel lymph node biopsy- HER2 positive, ER 11-50% positive,  PR 1-10% positive.-,  s/p adjuvant chemotherapy Taxol weekly x12, finnish 1 year of maintenance trastuzumab.  Lab results reviewed and discussed with patient  continue tamoxifen 20 mg daily.    # age of diagnosis is <50, recommend genetic testing.  recommend MRI breast #annual diagnostic mammogram  Long-term current use of tamoxifen Continue follow up with Gyn for annual pelvic examination-[last seen by gynecology Dr. Leafy Ro on 12/03/2021]. No orders of the defined types were placed in this encounter.   [place] language interpreter service was utilized during the entire encounter.   Follow up in 6 months All questions were answered. The patient knows to call the clinic with any problems, questions or concerns.  Earlie Server, MD, PhD [facility] Hematology Oncology 12/09/2022      HISTORY OF PRESENTING ILLNESS:   Anna Banks is a  47 y.o.  female presents for HER2 positive breast cancer.  Oncology History  Invasive carcinoma of breast ([place])  03/23/2021 Initial Diagnosis   Invasive carcinoma of breast  Menarche 47 years of age. G0P0. No child, no previous pregnancy. Per her sister, patient is not sexually active No prior use of birth control pills or hormone replacement therapy. Denies previous history of biopsy.   02/16/2021, screening mammogram showed calcification in the right breast needs further  evaluation.  Left breast possible mass with distortion requires further evaluation. 02/26/2021, left breast showed a 10 mm indeterminate mass, 2:00, 8 cm from nipple.  No mammographic evidence of malignancy in the right breast.  No left axillary lymphadenopathy. 03/10/2021, patient underwent ultrasound-guided biopsy of left breast mass.  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  ER low positive [1-10%], PR positive [1-10%], HER2 positive by IHC 3+   # 04/09/2021, status postlumpectomy and sentinel lymph node biopsy. Invasive mammary carcinoma, no special type, 4 lymph nodes all negative for metastatic carcinoma.  DCIS present.  LVI not identified.Grade 2, all margins negative for invasive carcinoma.pT1c pN0, ER [staining was repeated on surgical specimen] [11-50%]+ PR [1-10%]+, HER 2+     04/22/2021 Cancer Staging   Staging form: Breast, AJCC 8th Edition - Pathologic stage from 04/22/2021: Stage IA (pT1c, pN0, cM0, G2, ER+, PR+, HER2+) - Signed by Earlie Server, MD on 04/22/2021 Stage prefix: Initial diagnosis Multigene prognostic tests performed: None Histologic grading system: 3 grade system   05/08/2021 - 05/04/2022 Chemotherapy   Adjuvant chemotherapy Paclitaxel + Trastuzumab q7d / Trastuzumab q21d      10/01/2021 -  Radiation Therapy    adjuvant radiation of the breast   10/15/2021 -  Anti-estrogen oral therapy   started on tamoxifen 20 mg daily.   02/15/2022 Echocardiogram   Left ventricular ejection fraction, by estimation, is 60 to 65%.   03/10/2022 Mammogram   Bilateral diagnostic mammogram showed No mammographic evidence of malignancy bilaterally.    Genetic Testing   Negative genetic testing. No pathogenic variants  identified on the [facility] Common Hereditary Cancers+RNA panel. The report date is 09/21/2022.  The Common Hereditary Cancers Panel + RNA offered by [facility] includes sequencing and/or deletion  duplication testing of the following 48 genes: APC*, ATM*, AXIN2, BAP1, BARD1, BMPR1A, BRCA1, BRCA2, BRIP1, CDH1, CDK4, CDKN2A (p14ARF), CDKN2A (p16INK4a), CHEK2, CTNNA1, DICER1*, EPCAM*, FH*, GREM1*, HOXB13, KIT, MBD4, MEN1*, MLH1*, MSH2*, MSH3*, MSH6*, MUTYH, NF1*, NTHL1, PALB2, PDGFRA, PMS2*, POLD1*, POLE, PTEN*, RAD51C, RAD51D, SDHA*, SDHB, SDHC*, SDHD, SMAD4, SMARCA4, STK11, TP53, TSC1*, TSC2, VHL.     INTERVAL HISTORY Anna Banks is a 47 y.o. female who has above history reviewed by me today presents for follow up visit for chemotherapy for HER2 positive breast cancer  Tolerates tamoxifen 20 mg daily.  Patient has no new complaints.   Review of Systems  Unable to perform ROS: Other (intellectual/Cognitive impairment)  Constitutional:  Negative for appetite change, chills, fatigue and fever.  HENT:   Negative for hearing loss and voice change.   Eyes:  Negative for eye problems.  Respiratory:  Negative for chest tightness and cough.   Cardiovascular:  Negative for chest pain.  Gastrointestinal:  Negative for abdominal distention, abdominal pain, blood in stool, nausea and vomiting.  Endocrine: Negative for hot flashes.  Genitourinary:  Negative for difficulty urinating and frequency.   Musculoskeletal:  Negative for arthralgias.  Skin:  Negative for itching and rash.  Neurological:  Negative for extremity weakness.  Hematological:  Negative for adenopathy.  Psychiatric/Behavioral:  Negative for confusion.     MEDICAL HISTORY:  Past Medical History:  Diagnosis Date   Down syndrome    Seizure [facility])     SURGICAL HISTORY: Past Surgical History:  Procedure Laterality Date   BREAST BIOPSY Left 03/10/2021   [place] BX,ribbon clip, IMC   BREAST LUMPECTOMY,RADIO FREQ LOCALIZER,AXILLARY SENTINEL LYMPH NODE BIOPSY Left 04/09/2021   Procedure: BREAST LUMPECTOMY,RADIO FREQ LOCALIZER,AXILLARY SENTINEL LYMPH NODE BIOPSY;  Surgeon: Benjamine Sprague, DO;  Location: [facility] ORS;  Service: General;   Laterality: Left;    SOCIAL HISTORY: Social History   Socioeconomic History   Marital status: Single    Spouse name: Not on file   Number of children: Not on file   Years of education: Not on file   Highest education level: Not on file  Occupational History   Not on file  Tobacco Use   Smoking status: Never   Smokeless tobacco: Never  Vaping Use   Vaping Use: Never used  Substance and Sexual Activity   Alcohol use: Never   Drug use: Not Currently   Sexual activity: Not on file  Other Topics Concern   Not on file  Social History Narrative   Not on file   Social Determinants of Health   Financial Resource Strain: Not on file  Food Insecurity: Not on file  Transportation Needs: Not on file  Physical Activity: Not on file  Stress: Not on file  Social Connections: Not on file  Intimate Partner Violence: Not on file    FAMILY HISTORY: History reviewed. No pertinent family history.  ALLERGIES:  has No Known Allergies.  MEDICATIONS:  Current Outpatient Medications  Medication Sig Dispense Refill   acetaminophen (TYLENOL) 325 MG tablet Take 325-650 mg by mouth every 6 (six) hours as needed for moderate pain.     ethosuximide (ZARONTIN) 250 MG capsule Take by mouth.     HYDROcodone-acetaminophen (NORCO) 5-325 MG tablet Take 1 tablet by mouth every 6 (six) hours as needed for up to 6 doses  for moderate pain. 6 tablet 0   omeprazole (PRILOSEC) 20 MG capsule Take 1 capsule by mouth daily.     ondansetron (ZOFRAN) 8 MG tablet Take 1 tablet (8 mg total) by mouth 2 (two) times daily as needed (Nausea or vomiting). 30 tablet 1   VITAMIN D PO Take 1 capsule by mouth daily.     tamoxifen (NOLVADEX) 20 MG tablet Take 1 tablet (20 mg total) by mouth daily. 90 tablet 3   No current facility-administered medications for this visit.     PHYSICAL EXAMINATION: ECOG PERFORMANCE STATUS: 0 - Asymptomatic Vitals:   12/09/22 1507  BP: 120/88  Pulse: (!) 116  Temp: 98.1 F (36.7 C)    There were no vitals filed for this visit.   Physical Exam Constitutional:      General: She is not in acute distress. HENT:     Head: Normocephalic and atraumatic.  Eyes:     General: No scleral icterus. Cardiovascular:     Rate and Rhythm: Regular rhythm.     Heart sounds: Normal heart sounds.  Pulmonary:     Effort: Pulmonary effort is normal. No respiratory distress.     Breath sounds: No wheezing.  Abdominal:     General: Bowel sounds are normal. There is no distension.     Palpations: Abdomen is soft.  Musculoskeletal:        General: No deformity. Normal range of motion.     Cervical back: Normal range of motion and neck supple.  Skin:    General: Skin is warm and dry.     Findings: No erythema or rash.  Neurological:     Mental Status: She is alert. Mental status is at baseline.     Cranial Nerves: No cranial nerve deficit.     Coordination: Coordination normal.  Psychiatric:        Mood and Affect: Mood normal.    . Breast exam was performed in seated and lying down position. No palpable mass on the right breast.  No discrete palpable mass on left breast.  No palpable lymphadenopathy bilaterally  LABORATORY DATA:  I have reviewed the data as listed Lab Results  Component Value Date   WBC 6.4 12/07/2022   HGB 13.0 12/07/2022   HCT 39.6 12/07/2022   MCV 93.6 12/07/2022   PLT 300 12/07/2022   Recent Labs    05/04/22 0919 08/04/22 1130 12/07/22 1139  NA 139 136 135  K 3.4* 3.4* 3.7  CL 105 104 103  CO2 26 24 23   GLUCOSE 83 95 100*  BUN 13 12 10   CREATININE 0.69 0.61 0.57  CALCIUM 9.7 9.6 9.2  GFRNONAA >60 >60 >60  PROT 8.4* 7.9 7.7  ALBUMIN 4.3 4.1 4.0  AST 28 27 30   ALT 18 16 19   ALKPHOS 47 52 56  BILITOT 0.4 0.3 0.3    Iron/TIBC/Ferritin/ %Sat No results found for: "IRON", "TIBC", "FERRITIN", "IRONPCTSAT"    RADIOGRAPHIC STUDIES: I have personally reviewed the radiological images as listed and agreed with the findings in the  report. No results found.

## 2023-03-14 ENCOUNTER — Ambulatory Visit
Admission: RE | Admit: 2023-03-14 | Discharge: 2023-03-14 | Disposition: A | Discharge status: Home / Self Care | Payer: Medicare Other | Source: Ambulatory Visit | Attending: Oncology | Admitting: Oncology

## 2023-03-14 ENCOUNTER — Encounter: Payer: Self-pay | Admitting: Radiology

## 2023-03-14 DIAGNOSIS — Z853 Personal history of malignant neoplasm of breast: Secondary | ICD-10-CM

## 2023-04-24 IMAGING — US US PLC BREAST LOC DEV 1ST LESION INC US GUIDE*L*
1 series · 7 of 7 positions shown · non-contrast
Comparison: Previous exams.

CLINICAL DATA: Patient with a biopsy-proven invasive mammary
carcinoma in the LEFT breast at the 2 o'clock axis which is
scheduled for lumpectomy requiring preoperative radiofrequency tag
localization.

EXAM:
Radiofrequency tag LOCALIZATION OF THE LEFT BREAST WITH ULTRASOUND
GUIDANCE

[Series 1: us plc breast loc dev 1st lesion inc us guide*left · 0.05mm/px · 7 of 7 slices shown]
[im 1/7]
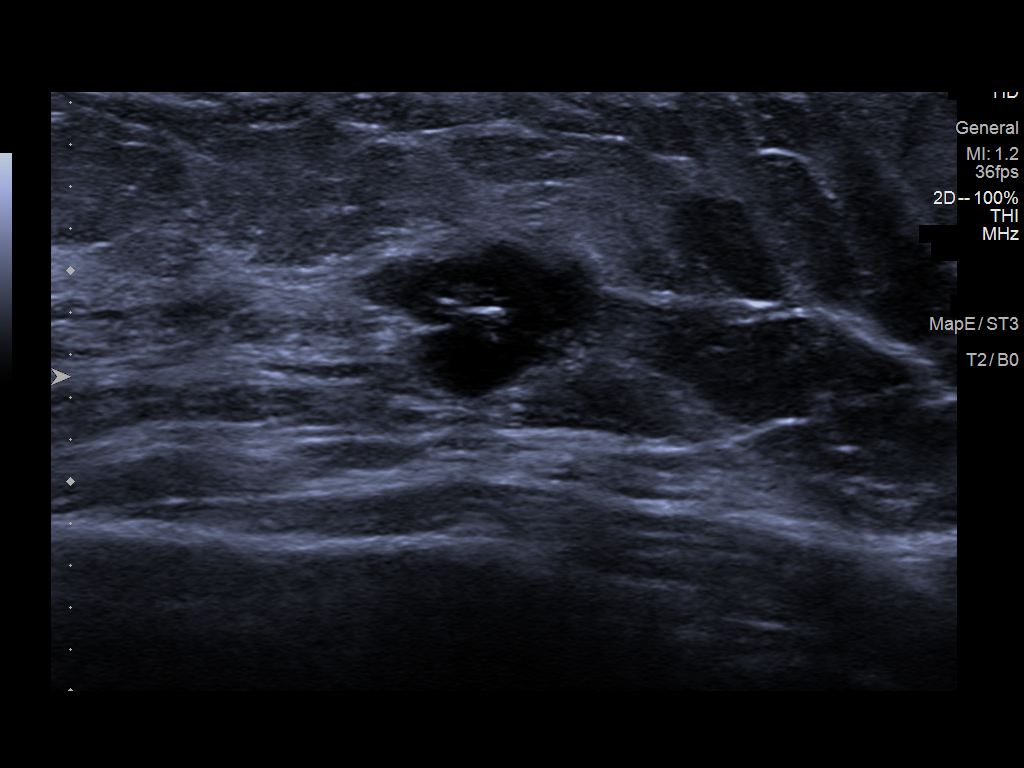
[im 2/7]
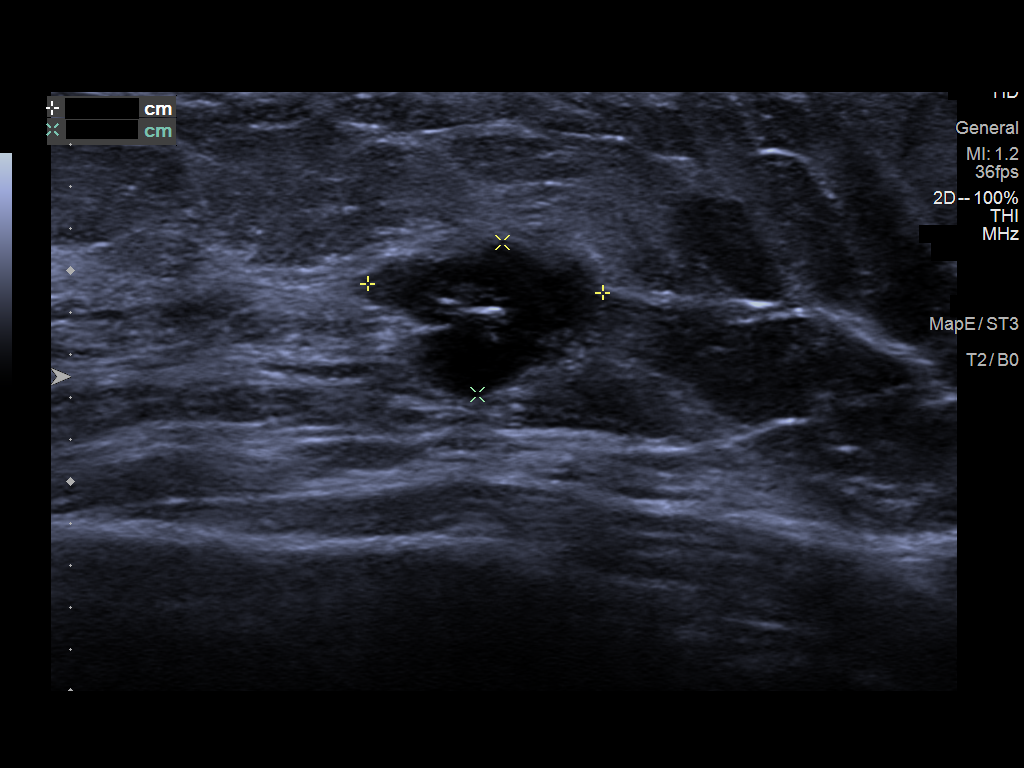
[im 3/7]
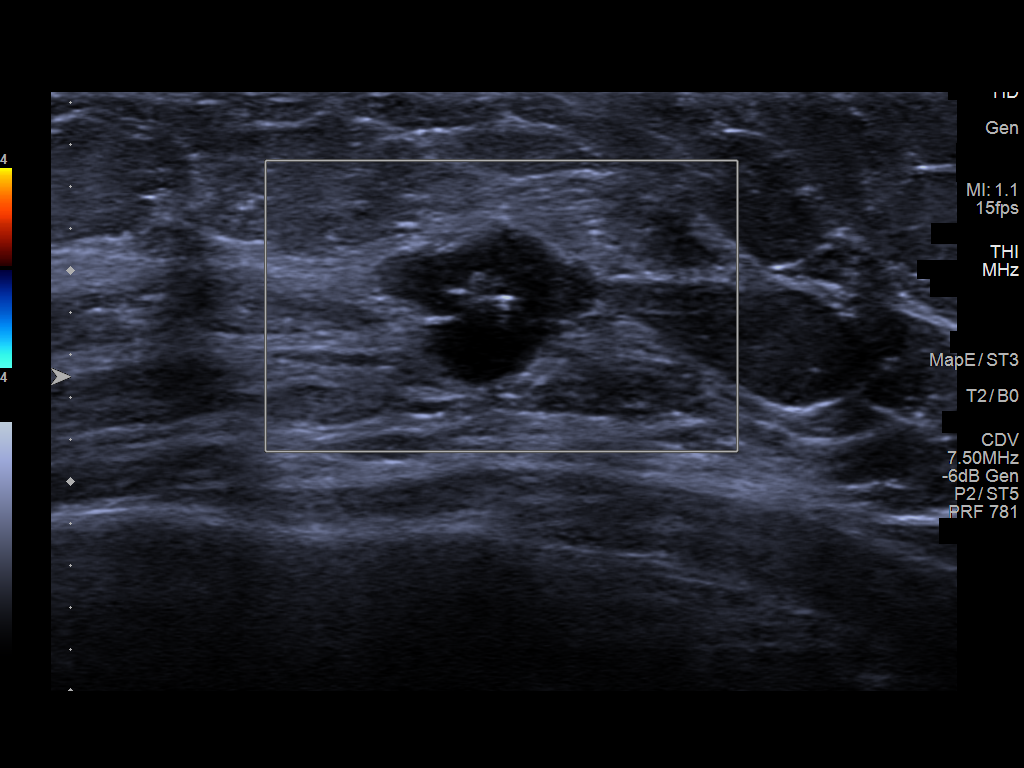
[im 4/7]
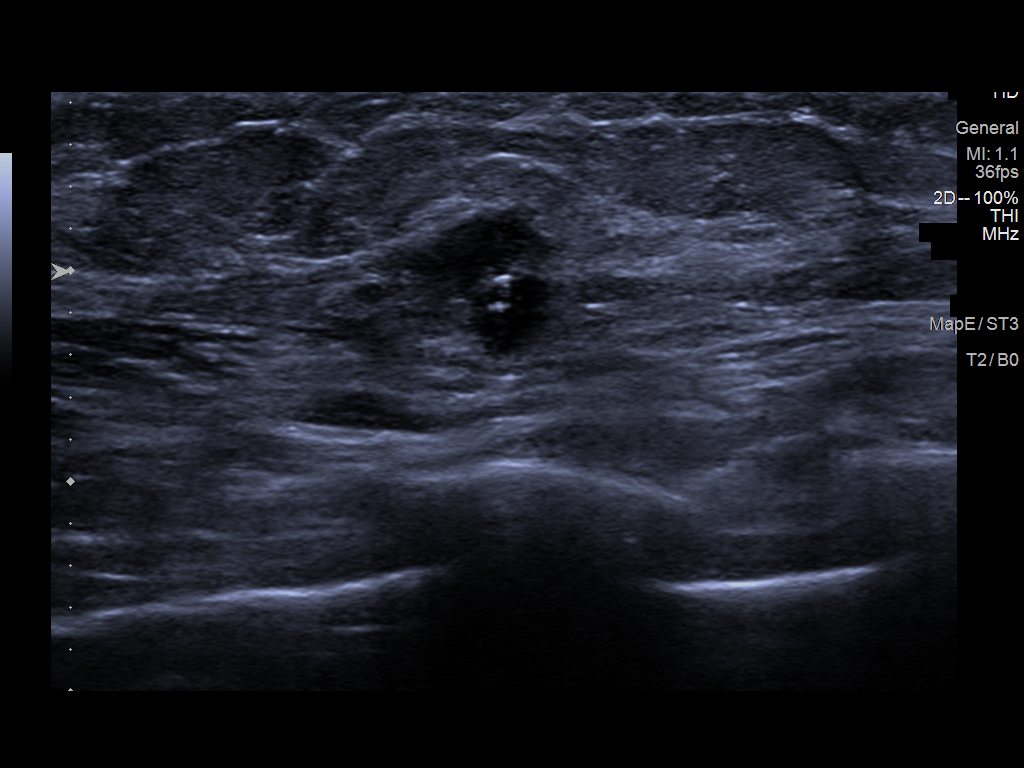
[im 5/7]
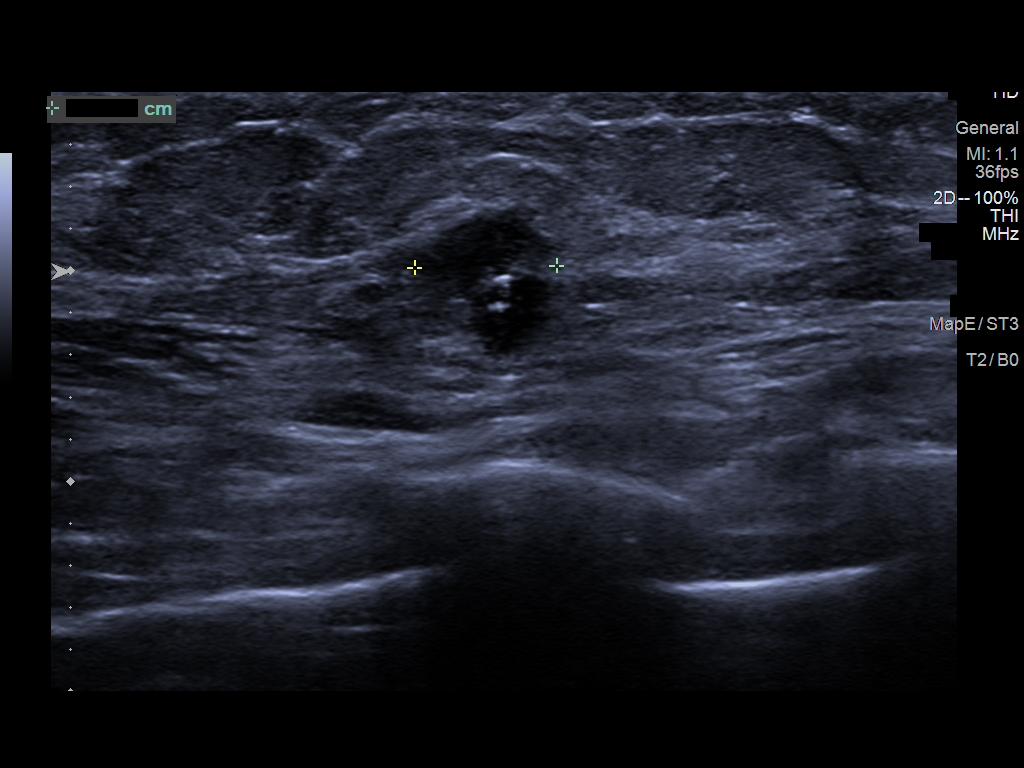
[im 6/7]
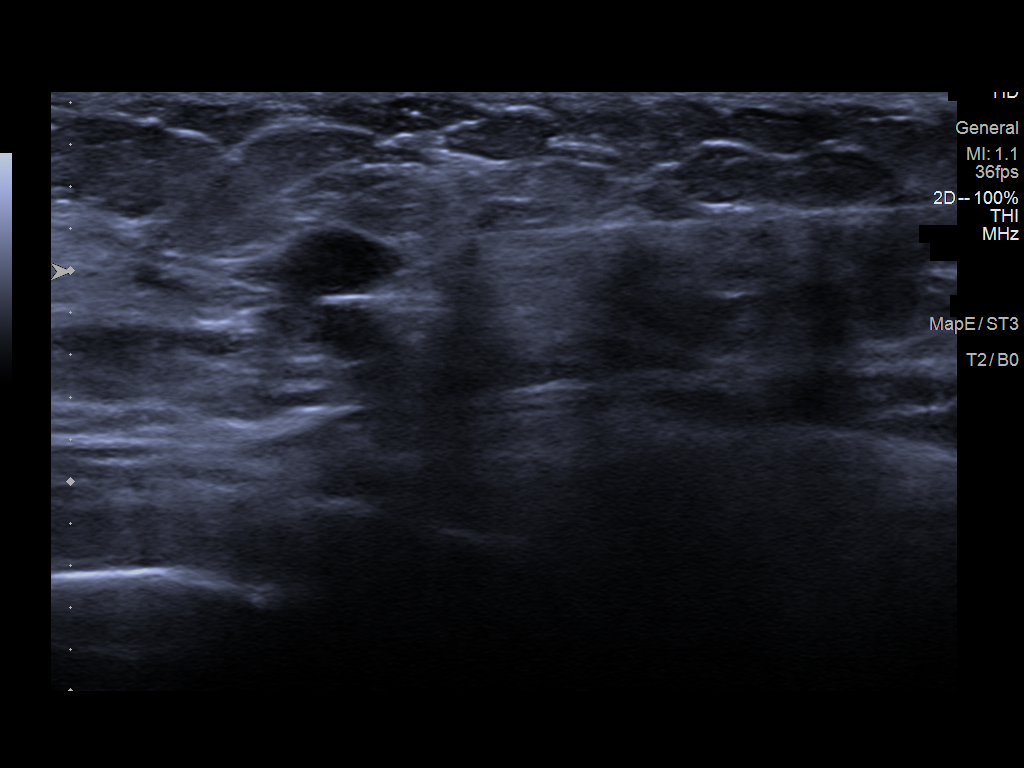
[im 7/7]
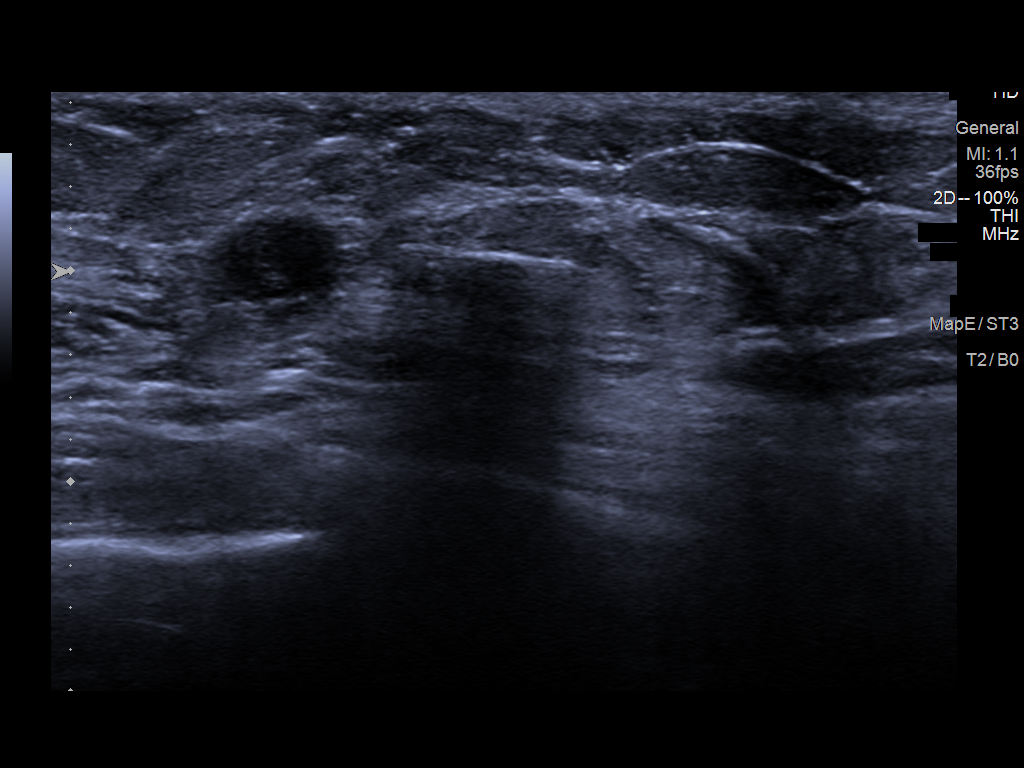

[7 of 7 positions shown; findings below may reference images not displayed]

FINDINGS: Patient presents for radiofrequency tag localization prior to
lumpectomy. I met with the patient and we discussed the procedure of
localization including benefits and alternatives. We discussed the
high likelihood of a successful procedure. We discussed the risks of
the procedure, including infection, bleeding, tissue injury, and
further surgery. Informed, written consent was given. The usual
time-out protocol was performed immediately prior to the procedure.

Using ultrasound guidance, sterile technique, 1% lidocaine and a 7
cm radiofrequency tag needle, the mass in the LEFT breast at the 2
o'clock axis, 8 cm from the nipple, was localized using a lateral
approach. Reflector function was confirmed with an auditory signal.
The images were marked for [place].
IMPRESSION: Radar reflector localization of the left breast. No apparent
complications.

## 2023-04-24 IMAGING — MG MM DIGITAL DIAGNOSTIC UNILAT*L* W/ TOMO W/ CAD
4 series · 4 of 12 positions shown · non-contrast
Comparison: Previous exam(s).

CLINICAL DATA: Status post ultrasound-guided placement of a
radiofrequency tag adjacent to a biopsy-proven invasive carcinoma in
the LEFT breast at the 2 o'clock axis.

EXAM:
DIAGNOSTIC LEFT MAMMOGRAM POST ultrasound-guided radiofrequency tag
localization.

[L MLO synth-2D]
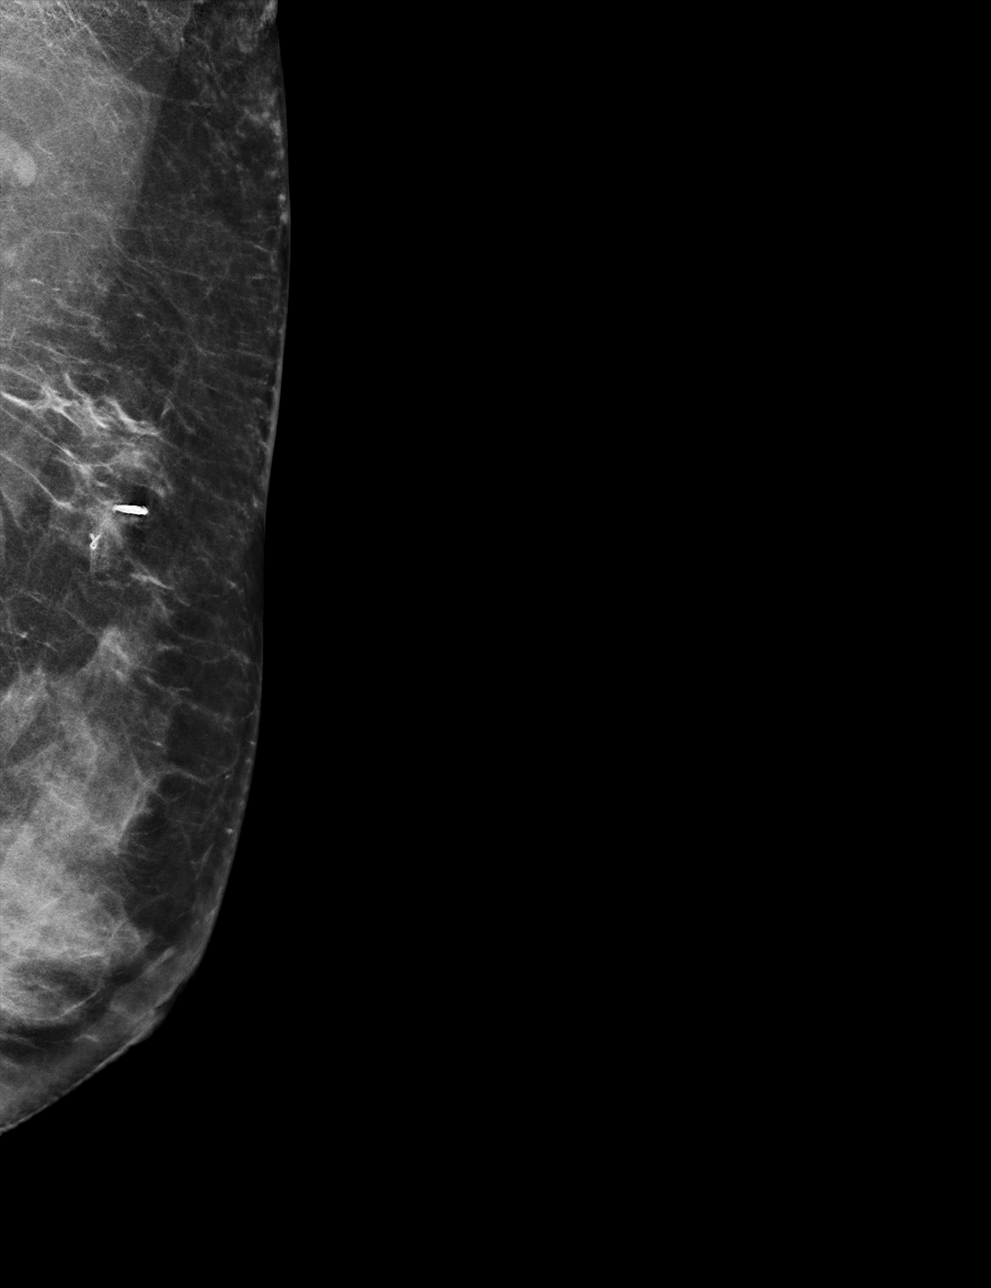

[L XCCL synth-2D]
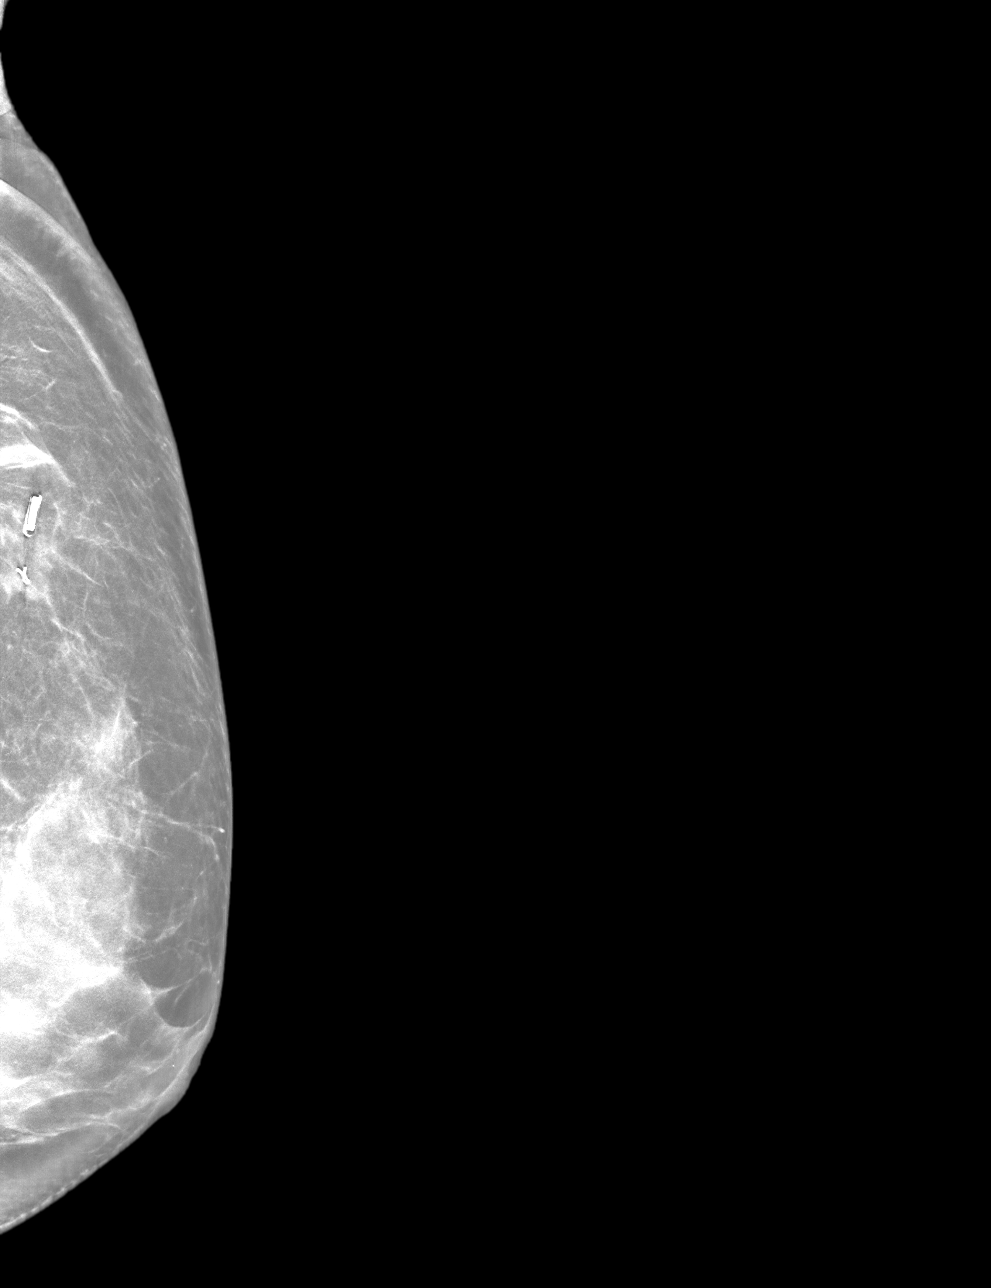

[L XCCL tomo · tomo slice 31/62.0]
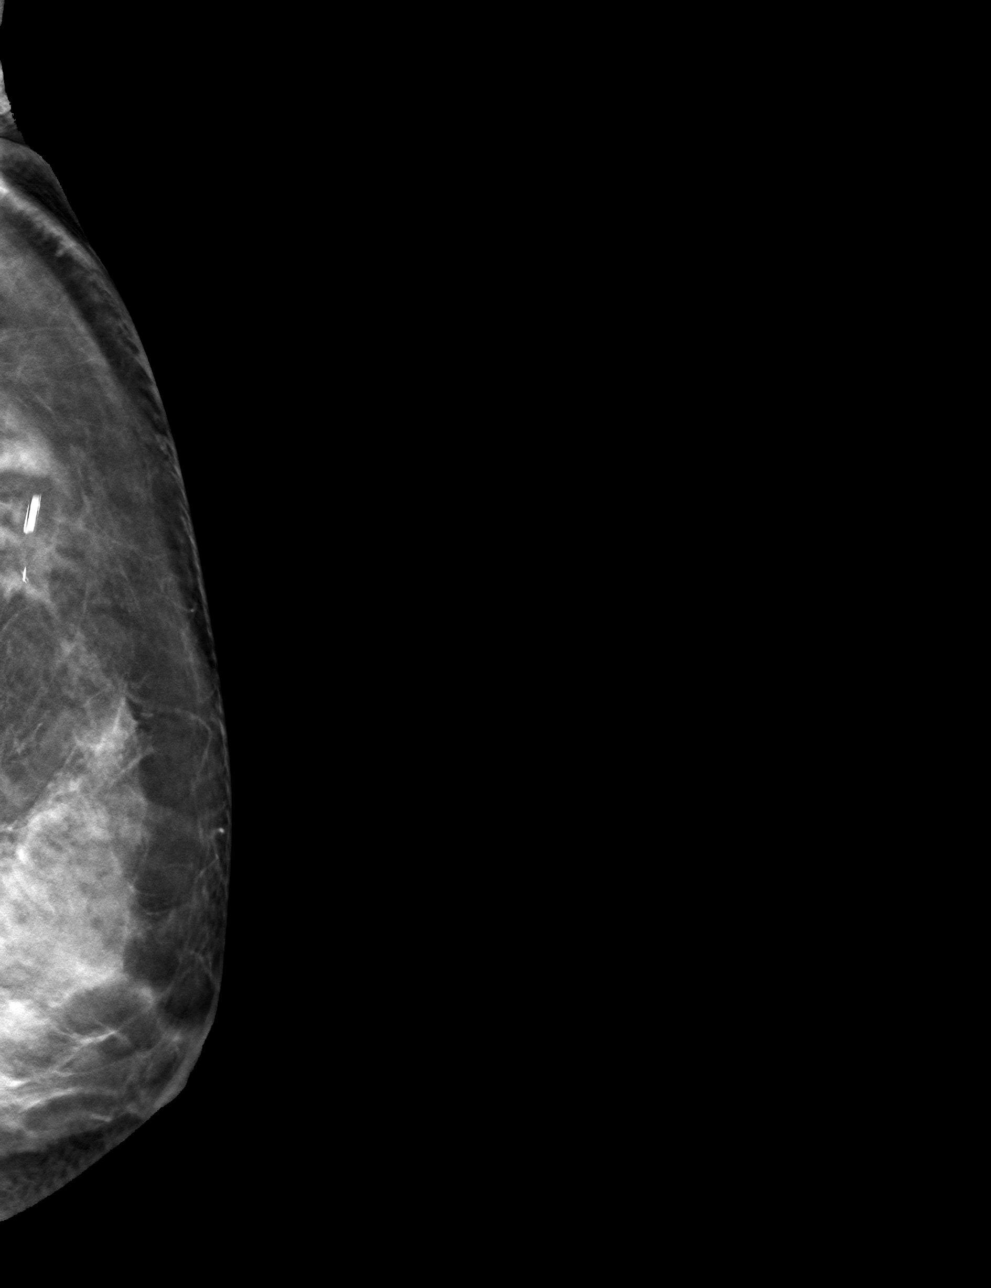

[L MLO tomo · tomo slice 35/70.0]
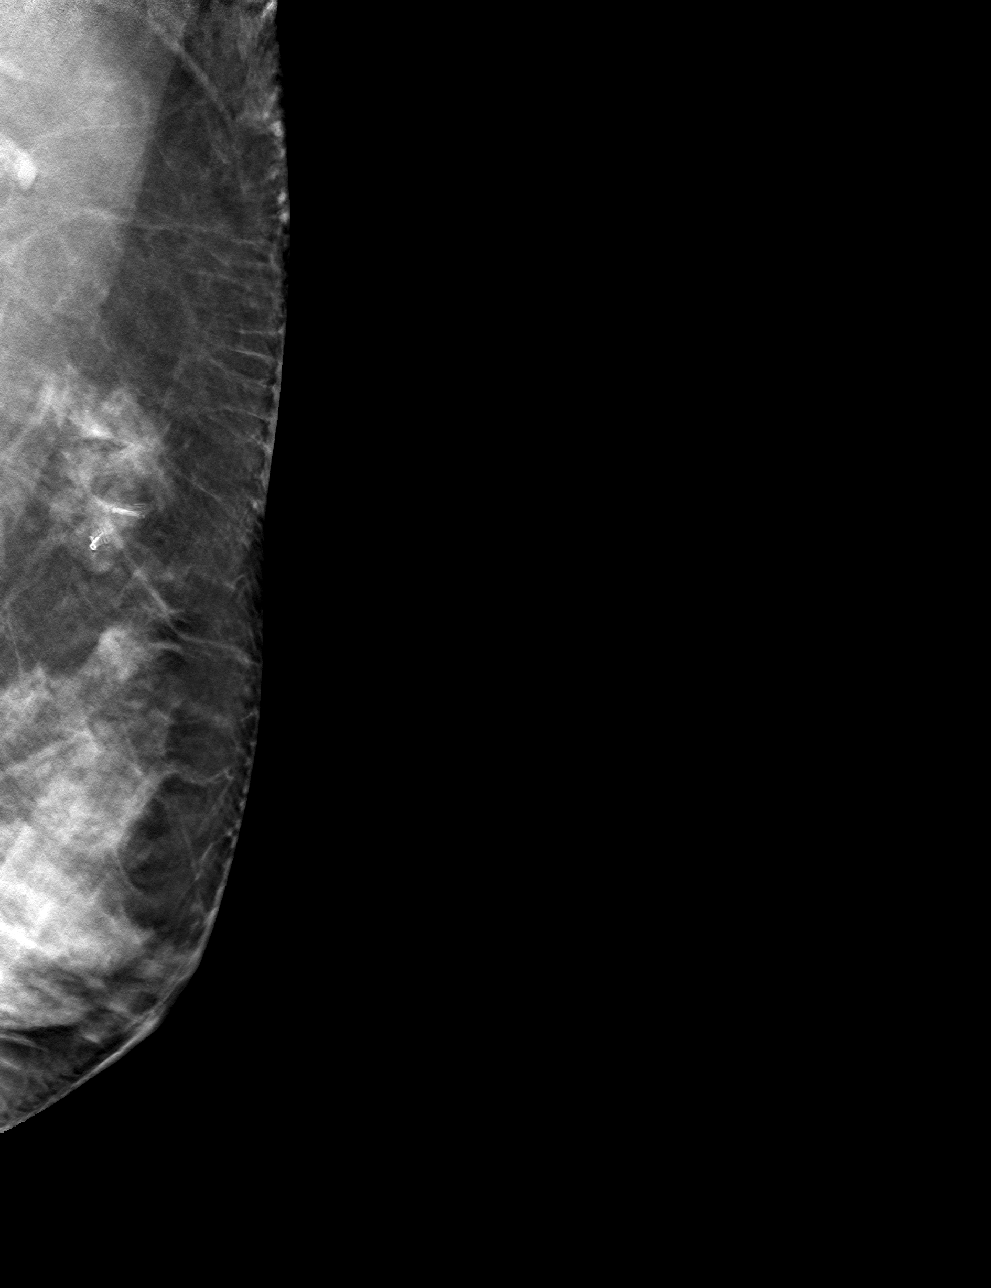

[4 of 12 positions shown; findings below may reference images not displayed]

FINDINGS: Mammographic images were obtained following ultrasound guided
radiofrequency tag localization of the LEFT breast mass at the 2
o'clock axis, 8 cm from the nipple. The radiofrequency tag is
positioned just lateral to the ribbon shaped clip which demarcates
the position of the LEFT breast mass.
IMPRESSION: Appropriate positioning of the radiofrequency tag slightly
superior-lateral to the LEFT breast mass (which contains a ribbon
shaped clip) at the 2 o'clock axis.

Final Assessment: Post Procedure Mammograms for Marker Placement

## 2023-04-27 IMAGING — MG MM BREAST SURGICAL SPECIMEN
1 series · 1 of 1 positions shown · non-contrast
Comparison: Previous exam(s).

CLINICAL DATA: Patient status post left lumpectomy.

EXAM:
SPECIMEN RADIOGRAPH OF THE LEFT BREAST

[L]
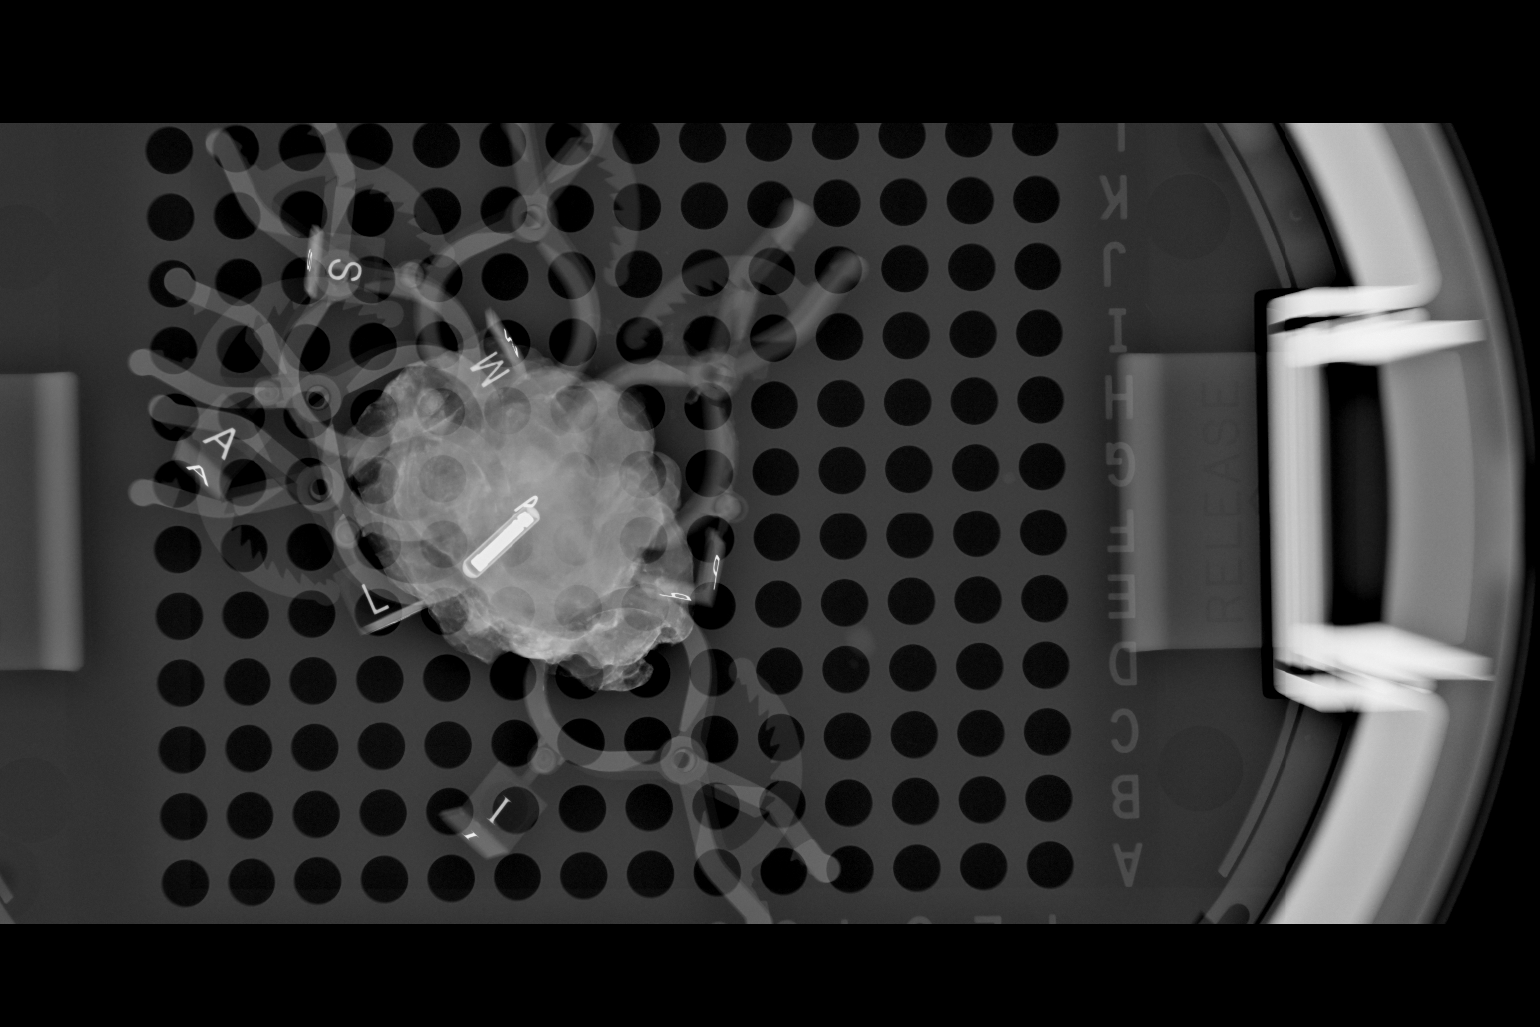

[1 of 1 positions shown; findings below may reference images not displayed]

FINDINGS: Status post excision of the left breast. The tag and biopsy marker
clip are present and completely intact.
IMPRESSION: Specimen radiograph of the left breast.

## 2023-06-02 ENCOUNTER — Encounter: Payer: Self-pay | Admitting: Oncology

## 2023-06-02 ENCOUNTER — Inpatient Hospital Stay (HOSPITAL_BASED_OUTPATIENT_CLINIC_OR_DEPARTMENT_OTHER): Payer: Medicare Other | Admitting: Oncology

## 2023-06-02 ENCOUNTER — Inpatient Hospital Stay: Payer: Medicare Other | Attending: Oncology

## 2023-06-02 VITALS — BP 102/80 | HR 86 | Temp 98.9°F | Resp 18 | Wt 128.7 lb

## 2023-06-02 DIAGNOSIS — C50919 Malignant neoplasm of unspecified site of unspecified female breast: Secondary | ICD-10-CM

## 2023-06-02 DIAGNOSIS — E876 Hypokalemia: Secondary | ICD-10-CM | POA: Diagnosis not present

## 2023-06-02 DIAGNOSIS — C50412 Malignant neoplasm of upper-outer quadrant of left female breast: Secondary | ICD-10-CM | POA: Diagnosis present

## 2023-06-02 DIAGNOSIS — Z170 Estrogen receptor positive status [ER+]: Secondary | ICD-10-CM | POA: Diagnosis not present

## 2023-06-02 DIAGNOSIS — Z79810 Long term (current) use of selective estrogen receptor modulators (SERMs): Secondary | ICD-10-CM

## 2023-06-02 LAB — CMP (CANCER CENTER ONLY)
ALT: 35 U/L (ref 0–44)
AST: 45 U/L — ABNORMAL HIGH (ref 15–41)
Albumin: 4.2 g/dL (ref 3.5–5.0)
Alkaline Phosphatase: 56 U/L (ref 38–126)
Anion gap: 11 (ref 5–15)
BUN: 11 mg/dL (ref 6–20)
CO2: 22 mmol/L (ref 22–32)
Calcium: 9.3 mg/dL (ref 8.9–10.3)
Chloride: 105 mmol/L (ref 98–111)
Creatinine: 0.72 mg/dL (ref 0.44–1.00)
GFR, Estimated: >60 mL/min (ref >60)
Glucose, Bld: 160 mg/dL — ABNORMAL HIGH (ref 70–99)
Potassium: 3.3 mmol/L — ABNORMAL LOW (ref 3.5–5.1)
Sodium: 138 mmol/L (ref 135–145)
Total Bilirubin: 0.4 mg/dL (ref 0.3–1.2)
Total Protein: 8.3 g/dL — ABNORMAL HIGH (ref 6.5–8.1)

## 2023-06-02 LAB — CBC WITH DIFFERENTIAL (CANCER CENTER ONLY)
Abs Immature Granulocytes: 0.01 10*3/uL (ref 0.00–0.07)
Basophils Absolute: 0 10*3/uL (ref 0.0–0.1)
Basophils Relative: 1 %
Eosinophils Absolute: 0.2 10*3/uL (ref 0.0–0.5)
Eosinophils Relative: 4 %
HCT: 41.7 % (ref 36.0–46.0)
Hemoglobin: 13.7 g/dL (ref 12.0–15.0)
Immature Granulocytes: 0 %
Lymphocytes Relative: 50 %
Lymphs Abs: 2.3 10*3/uL (ref 0.7–4.0)
MCH: 30.3 pg (ref 26.0–34.0)
MCHC: 32.9 g/dL (ref 30.0–36.0)
MCV: 92.3 fL (ref 80.0–100.0)
Monocytes Absolute: 0.8 10*3/uL (ref 0.1–1.0)
Monocytes Relative: 19 %
Neutro Abs: 1.2 10*3/uL — ABNORMAL LOW (ref 1.7–7.7)
Neutrophils Relative %: 26 %
Platelet Count: 283 10*3/uL (ref 150–400)
RBC: 4.52 MIL/uL (ref 3.87–5.11)
RDW: 12.6 % (ref 11.5–15.5)
WBC Count: 4.4 10*3/uL (ref 4.0–10.5)
nRBC: 0 % (ref 0.0–0.2)

## 2023-06-02 NOTE — Assessment & Plan Note (Addendum)
Recommend her to follow up with Gyn for annual pelvic examination-[last seen by gynecology Dr. Dalbert Garnet on 12/03/2021]. She is overdue

## 2023-06-02 NOTE — Progress Notes (Signed)
Hematology/Oncology Progress note [phone] [phone]     CHIEF COMPLAINTS/REASON FOR VISIT:  Follow up for left Triple positive breast cancer   ASSESSMENT & PLAN:   Cancer Staging  Invasive carcinoma of breast (HCC) Staging form: Breast, AJCC 8th Edition - Pathologic stage from 04/22/2021: Stage IA (pT1c, pN0, cM0, G2, ER+, PR+, HER2+) - Signed by Rickard Patience, MD on 04/22/2021   Invasive carcinoma of breast (HCC) Left invasive carcinoma of breast,pT1c pN0 cM0 status postlumpectomy and sentinel lymph node biopsy- HER2 positive, ER 11-50% positive,  PR 1-10% positive.-,  s/p adjuvant chemotherapy Taxol weekly x12, finnish 1 year of maintenance trastuzumab.  Genetic testing negative.  Lab results reviewed and discussed with patient continue tamoxifen 20 mg daily.  Continue annual MRI breasts and annual mammogram   Long-term current use of tamoxifen Recommend her to follow up with Gyn for annual pelvic examination-[last seen by gynecology Dr. Dalbert Garnet on 12/03/2021]. She is overdue  Hypokalemia Recommend potassium rich food Orders Placed This Encounter  Procedures   MR BREAST BILATERAL W WO CONTRAST INC CAD    Standing Status:   Future    Standing Expiration Date:   06/01/2024    Order Specific Question:   If indicated for the ordered procedure, I authorize the administration of contrast media per Radiology protocol    Answer:   Yes    Order Specific Question:   What is the patient's sedation requirement?    Answer:   No Sedation    Order Specific Question:   Does the patient have a pacemaker or implanted devices?    Answer:   No    Order Specific Question:   Radiology Contrast Protocol - do NOT remove file path    Answer:   [email or web address]    Order Specific Question:   Preferred imaging location?    Answer:   [facility] (table limit - 550lbs)   Cancer antigen 27.29    Standing Status:   Future    Standing  Expiration Date:   06/01/2024   Cancer antigen 15-3    Standing Status:   Future    Standing Expiration Date:   06/01/2024   CBC with Differential (Cancer Center Only)    Standing Status:   Future    Standing Expiration Date:   06/01/2024   CMP (Cancer Center only)    Standing Status:   Future    Standing Expiration Date:   06/01/2024    Online [place] language interpreter service was utilized during the entire encounter.   Follow up in 6 months All questions were answered. The patient knows to call the clinic with any problems, questions or concerns.  Rickard Patience, MD, PhD [facility] Hematology Oncology 06/02/2023      HISTORY OF PRESENTING ILLNESS:   Anna Banks is a  47 y.o.  female presents for HER2 positive breast cancer.  Oncology History  Invasive carcinoma of breast (HCC)  03/23/2021 Initial Diagnosis   Invasive carcinoma of breast  Menarche 47 years of age. G0P0. No child, no previous pregnancy. Per her sister, patient is not sexually active No prior use of birth control pills or hormone replacement therapy. Denies previous history of biopsy.   02/16/2021, screening mammogram showed calcification in the right breast needs further evaluation.  Left breast possible mass with distortion requires further evaluation. 02/26/2021, left breast showed a 10 mm indeterminate mass, 2:00, 8 cm from nipple.  No mammographic evidence of malignancy in the right breast.  No  left axillary lymphadenopathy. 03/10/2021, patient underwent ultrasound-guided biopsy of left breast mass.  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  ER low positive [1-10%], PR positive [1-10%], HER2 positive by IHC 3+   # 04/09/2021, status postlumpectomy and sentinel lymph node biopsy. Invasive mammary carcinoma, no special type, 4 lymph nodes all negative for metastatic carcinoma.  DCIS present.  LVI not identified.Grade 2, all margins  negative for invasive carcinoma.pT1c pN0, ER [staining was repeated on surgical specimen] [11-50%]+ PR [1-10%]+, HER 2+     04/22/2021 Cancer Staging   Staging form: Breast, AJCC 8th Edition - Pathologic stage from 04/22/2021: Stage IA (pT1c, pN0, cM0, G2, ER+, PR+, HER2+) - Signed by Rickard Patience, MD on 04/22/2021 Stage prefix: Initial diagnosis Multigene prognostic tests performed: None Histologic grading system: 3 grade system   05/08/2021 - 05/04/2022 Chemotherapy   Adjuvant chemotherapy Paclitaxel + Trastuzumab q7d / Trastuzumab q21d      10/01/2021 -  Radiation Therapy    adjuvant radiation of the breast   10/15/2021 -  Anti-estrogen oral therapy   started on tamoxifen 20 mg daily.   02/15/2022 Echocardiogram   Left ventricular ejection fraction, by estimation, is 60 to 65%.   03/10/2022 Mammogram   Bilateral diagnostic mammogram showed No mammographic evidence of malignancy bilaterally.    Genetic Testing   Negative genetic testing. No pathogenic variants identified on the [facility] Common Hereditary Cancers+RNA panel. The report date is 09/21/2022.  The Common Hereditary Cancers Panel + RNA offered by [facility] includes sequencing and/or deletion duplication testing of the following 48 genes: APC*, ATM*, AXIN2, BAP1, BARD1, BMPR1A, BRCA1, BRCA2, BRIP1, CDH1, CDK4, CDKN2A (p14ARF), CDKN2A (p16INK4a), CHEK2, CTNNA1, DICER1*, EPCAM*, FH*, GREM1*, HOXB13, KIT, MBD4, MEN1*, MLH1*, MSH2*, MSH3*, MSH6*, MUTYH, NF1*, NTHL1, PALB2, PDGFRA, PMS2*, POLD1*, POLE, PTEN*, RAD51C, RAD51D, SDHA*, SDHB, SDHC*, SDHD, SMAD4, SMARCA4, STK11, TP53, TSC1*, TSC2, VHL.    09/22/2022 Imaging   Bilateral breast MRI  1. No MRI evidence of malignancy involving either breast. 2. Expected post lumpectomy changes and post radiation changes involving the LEFT breast    INTERVAL HISTORY Anna Banks is a 47 y.o. female who has above history reviewed by me today presents for follow up visit for chemotherapy for HER2  positive breast cancer  Tolerates tamoxifen 20 mg daily.  Patient has no new complaints.   Review of Systems  Unable to perform ROS: Other (intellectual/Cognitive impairment)  Constitutional:  Negative for appetite change, chills, fatigue and fever.  HENT:   Negative for voice change.   Eyes:  Negative for eye problems.  Respiratory:  Negative for chest tightness and cough.   Gastrointestinal:  Negative for abdominal distention and abdominal pain.  Endocrine: Negative for hot flashes.  Genitourinary:  Negative for difficulty urinating.   Musculoskeletal:  Negative for arthralgias.  Skin:  Negative for rash.  Neurological:  Negative for extremity weakness.  Hematological:  Negative for adenopathy.  Psychiatric/Behavioral:  Negative for confusion.     MEDICAL HISTORY:  Past Medical History:  Diagnosis Date   Down syndrome    Seizure [facility])     SURGICAL HISTORY: Past Surgical History:  Procedure Laterality Date   BREAST BIOPSY Left 03/10/2021   [place] BX,ribbon clip, IMC   BREAST LUMPECTOMY,RADIO FREQ LOCALIZER,AXILLARY SENTINEL LYMPH NODE BIOPSY Left 04/09/2021   Procedure: BREAST LUMPECTOMY,RADIO FREQ LOCALIZER,AXILLARY SENTINEL LYMPH NODE BIOPSY;  Surgeon: Sung Amabile, DO;  Location: [facility] ORS;  Service: General;  Laterality:  Left;    SOCIAL HISTORY: Social History   Socioeconomic History   Marital status: Single    Spouse name: Not on file   Number of children: Not on file   Years of education: Not on file   Highest education level: Not on file  Occupational History   Not on file  Tobacco Use   Smoking status: Never   Smokeless tobacco: Never  Vaping Use   Vaping status: Never Used  Substance and Sexual Activity   Alcohol use: Never   Drug use: Not Currently   Sexual activity: Not on file  Other Topics Concern   Not on file  Social History Narrative   Not on file   Social Determinants of Health   Financial Resource Strain: Not on file  Food Insecurity: Not  on file  Transportation Needs: Not on file  Physical Activity: Not on file  Stress: Not on file  Social Connections: Not on file  Intimate Partner Violence: Not on file    FAMILY HISTORY: Family History  Problem Relation Age of Onset   Breast cancer Neg Hx     ALLERGIES:  has No Known Allergies.  MEDICATIONS:  Current Outpatient Medications  Medication Sig Dispense Refill   acetaminophen (TYLENOL) 325 MG tablet Take 325-650 mg by mouth every 6 (six) hours as needed for moderate pain.     ethosuximide (ZARONTIN) 250 MG capsule Take by mouth.     HYDROcodone-acetaminophen (NORCO) 5-325 MG tablet Take 1 tablet by mouth every 6 (six) hours as needed for up to 6 doses for moderate pain. 6 tablet 0   omeprazole (PRILOSEC) 20 MG capsule Take 1 capsule by mouth daily.     ondansetron (ZOFRAN) 8 MG tablet Take 1 tablet (8 mg total) by mouth 2 (two) times daily as needed (Nausea or vomiting). 30 tablet 1   tamoxifen (NOLVADEX) 20 MG tablet Take 1 tablet (20 mg total) by mouth daily. 90 tablet 3   VITAMIN D PO Take 1 capsule by mouth daily.     No current facility-administered medications for this visit.     PHYSICAL EXAMINATION: ECOG PERFORMANCE STATUS: 0 - Asymptomatic Vitals:   06/02/23 1439  BP: 102/80  Pulse: 86  Resp: 18  Temp: 98.9 F (37.2 C)   Filed Weights   06/02/23 1439  Weight: 128 lb 11.2 oz (58.4 kg)    Physical Exam Constitutional:      General: She is not in acute distress. HENT:     Head: Normocephalic and atraumatic.  Eyes:     General: No scleral icterus. Cardiovascular:     Rate and Rhythm: Regular rhythm.     Heart sounds: Normal heart sounds.  Pulmonary:     Effort: Pulmonary effort is normal. No respiratory distress.     Breath sounds: No wheezing.  Abdominal:     General: Bowel sounds are normal. There is no distension.     Palpations: Abdomen is soft.  Musculoskeletal:        General: No deformity. Normal range of motion.     Cervical  back: Normal range of motion and neck supple.  Skin:    General: Skin is warm and dry.     Findings: No erythema or rash.  Neurological:     Mental Status: She is alert. Mental status is at baseline.     Cranial Nerves: No cranial nerve deficit.     Coordination: Coordination normal.  Psychiatric:        Mood  and Affect: Mood normal.      LABORATORY DATA:  I have reviewed the data as listed Lab Results  Component Value Date   WBC 4.4 06/02/2023   HGB 13.7 06/02/2023   HCT 41.7 06/02/2023   MCV 92.3 06/02/2023   PLT 283 06/02/2023   Recent Labs    08/04/22 1130 12/07/22 1139 06/02/23 1425  NA 136 135 138  K 3.4* 3.7 3.3*  CL 104 103 105  CO2 24 23 22   GLUCOSE 95 100* 160*  BUN 12 10 11   CREATININE 0.61 0.57 0.72  CALCIUM 9.6 9.2 9.3  GFRNONAA >60 >60 >60  PROT 7.9 7.7 8.3*  ALBUMIN 4.1 4.0 4.2  AST 27 30 45*  ALT 16 19 35  ALKPHOS 52 56 56  BILITOT 0.3 0.3 0.4   Iron/TIBC/Ferritin/ %Sat No results found for: "IRON", "TIBC", "FERRITIN", "IRONPCTSAT"    RADIOGRAPHIC STUDIES: I have personally reviewed the radiological images as listed and agreed with the findings in the report. No results found.

## 2023-06-02 NOTE — Assessment & Plan Note (Addendum)
Recommend potassium rich food.

## 2023-06-02 NOTE — Assessment & Plan Note (Addendum)
Left invasive carcinoma of breast,pT1c pN0 cM0 status postlumpectomy and sentinel lymph node biopsy- HER2 positive, ER 11-50% positive,  PR 1-10% positive.-,  s/p adjuvant chemotherapy Taxol weekly x12, finnish 1 year of maintenance trastuzumab.  Genetic testing negative.  Lab results reviewed and discussed with patient continue tamoxifen 20 mg daily.   Continue annual MRI breasts and annual mammogram

## 2023-06-03 LAB — CANCER ANTIGEN 27.29: CA 27.29: 33.8 U/mL (ref 0.0–38.6)

## 2023-06-03 LAB — CANCER ANTIGEN 15-3: CA 15-3: 26.2 U/mL — ABNORMAL HIGH (ref 0.0–25.0)

## 2023-06-13 ENCOUNTER — Other Ambulatory Visit: Payer: Medicare Other

## 2023-06-13 ENCOUNTER — Ambulatory Visit: Payer: Medicare Other | Admitting: Oncology

## 2023-06-16 ENCOUNTER — Ambulatory Visit: Payer: Medicare Other | Admitting: Oncology

## 2023-06-16 ENCOUNTER — Other Ambulatory Visit: Payer: Medicare Other

## 2023-09-21 ENCOUNTER — Ambulatory Visit: Admission: RE | Admit: 2023-09-21 | Payer: Medicare Other | Source: Ambulatory Visit

## 2023-09-30 ENCOUNTER — Ambulatory Visit
Admission: RE | Admit: 2023-09-30 | Discharge: 2023-09-30 | Disposition: A | Discharge status: Home / Self Care | Payer: Medicare Other | Source: Ambulatory Visit | Attending: Oncology

## 2023-09-30 DIAGNOSIS — C50919 Malignant neoplasm of unspecified site of unspecified female breast: Secondary | ICD-10-CM | POA: Insufficient documentation

## 2023-09-30 MED ORDER — GADOBUTROL 1 MMOL/ML IV SOLN
5.0000 mL | Freq: Once | INTRAVENOUS | Status: AC | PRN
  Administered 2023-09-30: 5 mL via INTRAVENOUS

## 2023-11-03 ENCOUNTER — Ambulatory Visit: Payer: Medicare Other | Admitting: Radiation Oncology

## 2023-11-14 ENCOUNTER — Ambulatory Visit
Admission: RE | Admit: 2023-11-14 | Discharge: 2023-11-14 | Disposition: A | Discharge status: Home / Self Care | Payer: Medicare Other | Source: Ambulatory Visit | Attending: Radiation Oncology | Admitting: Radiation Oncology

## 2023-11-14 ENCOUNTER — Encounter: Payer: Self-pay | Admitting: Radiation Oncology

## 2023-11-14 VITALS — BP 118/96 | HR 91 | Temp 99.0°F | Resp 16 | Wt 138.0 lb

## 2023-11-14 DIAGNOSIS — Z923 Personal history of irradiation: Secondary | ICD-10-CM | POA: Insufficient documentation

## 2023-11-14 DIAGNOSIS — C50919 Malignant neoplasm of unspecified site of unspecified female breast: Secondary | ICD-10-CM

## 2023-11-14 DIAGNOSIS — C50412 Malignant neoplasm of upper-outer quadrant of left female breast: Secondary | ICD-10-CM | POA: Insufficient documentation

## 2023-11-14 DIAGNOSIS — Z79810 Long term (current) use of selective estrogen receptor modulators (SERMs): Secondary | ICD-10-CM | POA: Insufficient documentation

## 2023-11-14 DIAGNOSIS — Z170 Estrogen receptor positive status [ER+]: Secondary | ICD-10-CM | POA: Diagnosis not present

## 2023-11-14 NOTE — Progress Notes (Signed)
 Radiation Oncology Follow up Note  Name: Anna Banks   Date:   11/14/2023 MRN:  [id number] DOB: [birth year]    This 48 y.o. female presents to the clinic today for 2-year follow-up status post whole breast radiation to her left breast for stage Ia ER low PR positive HER2/neu overexpressed invasive mammary carcinoma.  REFERRING PROVIDER: Lauro Regulus, MD  HPI: Patient is a 48 year old female now out over 2 years having pleated whole breast radiation to her left breast for stage Ia ER low positivity PR positive and HER2/neu overexpressed invasive mammary carcinoma.  Seen today in routine follow-up she is doing well specifically denies breast tenderness cough or bone pain..  She had breast MRI back in January which I have reviewed was BI-RADS 2 benign.  She is currently on tamoxifen tolerating it well without side effect.  COMPLICATIONS OF TREATMENT: none  FOLLOW UP COMPLIANCE: keeps appointments   PHYSICAL EXAM:  BP (!) 118/96   Pulse 91   Temp 99 F (37.2 C) (Temporal)   Resp 16   Wt 138 lb (62.6 kg)   BMI 25.24 kg/m  Lungs are clear to A&P cardiac examination essentially unremarkable with regular rate and rhythm. No dominant mass or nodularity is noted in either breast in 2 positions examined. Incision is well-healed. No axillary or supraclavicular adenopathy is appreciated. Cosmetic result is excellent.  Well-developed well-nourished patient in NAD. HEENT reveals PERLA, EOMI, discs not visualized.  Oral cavity is clear. No oral mucosal lesions are identified. Neck is clear without evidence of cervical or supraclavicular adenopathy. Lungs are clear to A&P. Cardiac examination is essentially unremarkable with regular rate and rhythm without murmur rub or thrill. Abdomen is benign with no organomegaly or masses noted. Motor sensory and DTR levels are equal and symmetric in the upper and lower extremities. Cranial nerves II through XII are grossly intact. Proprioception is intact. No  peripheral adenopathy or edema is identified. No motor or sensory levels are noted. Crude visual fields are within normal range.  RADIOLOGY RESULTS: MRI reviewed compatible with above-stated findings  PLAN: The present time patient is doing well now out over 2 years with no evidence of disease.  I am going to turn follow-up care over to medical oncology.  I be happy to reevaluate the patient anytime should that be indicated.  She continues on tamoxifen without side effect.  Patient knows to call with any concerns.  I would like to take this opportunity to thank you for allowing me to participate in the care of your patient.Carmina Miller, MD

## 2023-12-08 ENCOUNTER — Inpatient Hospital Stay (HOSPITAL_BASED_OUTPATIENT_CLINIC_OR_DEPARTMENT_OTHER): Payer: Medicare Other | Admitting: Oncology

## 2023-12-08 ENCOUNTER — Encounter: Payer: Self-pay | Admitting: Oncology

## 2023-12-08 ENCOUNTER — Inpatient Hospital Stay: Payer: Medicare Other | Attending: Oncology

## 2023-12-08 VITALS — BP 123/85 | HR 96 | Temp 97.8°F | Resp 20 | Wt 136.7 lb

## 2023-12-08 DIAGNOSIS — C50912 Malignant neoplasm of unspecified site of left female breast: Secondary | ICD-10-CM | POA: Insufficient documentation

## 2023-12-08 DIAGNOSIS — Z1231 Encounter for screening mammogram for malignant neoplasm of breast: Secondary | ICD-10-CM

## 2023-12-08 DIAGNOSIS — Z79810 Long term (current) use of selective estrogen receptor modulators (SERMs): Secondary | ICD-10-CM

## 2023-12-08 DIAGNOSIS — Z1731 Human epidermal growth factor receptor 2 positive status: Secondary | ICD-10-CM | POA: Diagnosis not present

## 2023-12-08 DIAGNOSIS — Z170 Estrogen receptor positive status [ER+]: Secondary | ICD-10-CM | POA: Diagnosis not present

## 2023-12-08 DIAGNOSIS — C50919 Malignant neoplasm of unspecified site of unspecified female breast: Secondary | ICD-10-CM

## 2023-12-08 LAB — CBC WITH DIFFERENTIAL (CANCER CENTER ONLY)
Abs Immature Granulocytes: 0.02 10*3/uL (ref 0.00–0.07)
Basophils Absolute: 0 10*3/uL (ref 0.0–0.1)
Basophils Relative: 1 %
Eosinophils Absolute: 0.2 10*3/uL (ref 0.0–0.5)
Eosinophils Relative: 4 %
HCT: 40.4 % (ref 36.0–46.0)
Hemoglobin: 13.4 g/dL (ref 12.0–15.0)
Immature Granulocytes: 0 %
Lymphocytes Relative: 45 %
Lymphs Abs: 2.5 10*3/uL (ref 0.7–4.0)
MCH: 30.4 pg (ref 26.0–34.0)
MCHC: 33.2 g/dL (ref 30.0–36.0)
MCV: 91.6 fL (ref 80.0–100.0)
Monocytes Absolute: 0.6 10*3/uL (ref 0.1–1.0)
Monocytes Relative: 11 %
Neutro Abs: 2.2 10*3/uL (ref 1.7–7.7)
Neutrophils Relative %: 39 %
Platelet Count: 258 10*3/uL (ref 150–400)
RBC: 4.41 MIL/uL (ref 3.87–5.11)
RDW: 13.1 % (ref 11.5–15.5)
WBC Count: 5.5 10*3/uL (ref 4.0–10.5)
nRBC: 0 % (ref 0.0–0.2)

## 2023-12-08 LAB — CMP (CANCER CENTER ONLY)
ALT: 30 U/L (ref 0–44)
AST: 35 U/L (ref 15–41)
Albumin: 4.1 g/dL (ref 3.5–5.0)
Alkaline Phosphatase: 56 U/L (ref 38–126)
Anion gap: 12 (ref 5–15)
BUN: 9 mg/dL (ref 6–20)
CO2: 23 mmol/L (ref 22–32)
Calcium: 9.5 mg/dL (ref 8.9–10.3)
Chloride: 102 mmol/L (ref 98–111)
Creatinine: 0.64 mg/dL (ref 0.44–1.00)
GFR, Estimated: >60 mL/min (ref >60)
Glucose, Bld: 111 mg/dL — ABNORMAL HIGH (ref 70–99)
Potassium: 3.6 mmol/L (ref 3.5–5.1)
Sodium: 137 mmol/L (ref 135–145)
Total Bilirubin: 0.6 mg/dL (ref 0.0–1.2)
Total Protein: 7.8 g/dL (ref 6.5–8.1)

## 2023-12-08 MED ORDER — TAMOXIFEN CITRATE 20 MG PO TABS: 20.0000 mg | ORAL_TABLET | Freq: Every day | ORAL | 3 refills | Status: DC

## 2023-12-08 NOTE — Assessment & Plan Note (Signed)
Left invasive carcinoma of breast,pT1c pN0 cM0 status postlumpectomy and sentinel lymph node biopsy- HER2 positive, ER 11-50% positive,  PR 1-10% positive.-,  s/p adjuvant chemotherapy Taxol weekly x12, finnish 1 year of maintenance trastuzumab.  Genetic testing negative.  Lab results reviewed and discussed with patient continue tamoxifen 20 mg daily.   Continue annual MRI breasts and annual mammogram

## 2023-12-08 NOTE — Progress Notes (Signed)
 Hematology/Oncology Progress note [phone] Fax:([phone]     CHIEF COMPLAINTS/REASON FOR VISIT:  Follow up for left Triple positive breast cancer   ASSESSMENT & PLAN:   Cancer Staging  Invasive carcinoma of breast (HCC) Staging form: Breast, AJCC 8th Edition - Pathologic stage from 04/22/2021: Stage IA (pT1c, pN0, cM0, G2, ER+, PR+, HER2+) - Signed by Rickard Patience, MD on 04/22/2021   Invasive carcinoma of breast (HCC) Left invasive carcinoma of breast,pT1c pN0 cM0 status postlumpectomy and sentinel lymph node biopsy- HER2 positive, ER 11-50% positive,  PR 1-10% positive.-,  s/p adjuvant chemotherapy Taxol weekly x12, finnish 1 year of maintenance trastuzumab.  Genetic testing negative.  Lab results reviewed and discussed with patient continue tamoxifen 20 mg daily.  Continue annual MRI breasts and annual mammogram   Long-term current use of tamoxifen Recommend her to follow up with Gyn for annual pelvic examination-[last seen by gynecology Dr. Dalbert Garnet on 12/03/2021]. She is overdue for gynecology follow-up.  Patient is provided with gynecology office contact and urged to reestablish care.   Orders Placed This Encounter  Procedures   MM 3D SCREENING MAMMOGRAM BILATERAL BREAST    Standing Status:   Future    Expected Date:   03/28/2024    Expiration Date:   12/07/2024    Reason for Exam (SYMPTOM  OR DIAGNOSIS REQUIRED):   hx breast cancer    Preferred imaging location?:   [facility]    Is the patient pregnant?:   No   CBC with Differential (Cancer Center Only)    Standing Status:   Future    Expected Date:   06/09/2024    Expiration Date:   12/07/2024   CMP (Cancer Center only)    Standing Status:   Future    Expected Date:   06/09/2024    Expiration Date:   12/07/2024   Cancer antigen 27.29    Standing Status:   Future    Expected Date:   06/09/2024    Expiration Date:   12/07/2024   Cancer antigen 15-3    Standing Status:   Future    Expected Date:    06/09/2024    Expiration Date:   12/07/2024    Online [place] language interpreter service was utilized during the entire encounter.   Follow up in 6 months All questions were answered. The patient knows to call the clinic with any problems, questions or concerns.  Rickard Patience, MD, PhD [facility] Hematology Oncology 12/08/2023      HISTORY OF PRESENTING ILLNESS:   Anna Banks is a  48 y.o.  female presents for HER2 positive breast cancer.  Oncology History  Invasive carcinoma of breast (HCC)  03/23/2021 Initial Diagnosis   Invasive carcinoma of breast  Menarche 48 years of age. G0P0. No child, no previous pregnancy. Per her sister, patient is not sexually active No prior use of birth control pills or hormone replacement therapy. Denies previous history of biopsy.   02/16/2021, screening mammogram showed calcification in the right breast needs further evaluation.  Left breast possible mass with distortion requires further evaluation. 02/26/2021, left breast showed a 10 mm indeterminate mass, 2:00, 8 cm from nipple.  No mammographic evidence of malignancy in the right breast.  No left axillary lymphadenopathy. 03/10/2021, patient underwent ultrasound-guided biopsy of left breast mass.  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  ER low positive [1-10%], PR positive [1-10%], HER2 positive by IHC  3+   # 04/09/2021, status postlumpectomy and sentinel lymph node biopsy. Invasive mammary carcinoma, no special type, 4 lymph nodes all negative for metastatic carcinoma.  DCIS present.  LVI not identified.Grade 2, all margins negative for invasive carcinoma.pT1c pN0, ER [staining was repeated on surgical specimen] [11-50%]+ PR [1-10%]+, HER 2+     04/22/2021 Cancer Staging   Staging form: Breast, AJCC 8th Edition - Pathologic stage from 04/22/2021: Stage IA (pT1c, pN0, cM0, G2, ER+, PR+, HER2+) - Signed by Rickard Patience,  MD on 04/22/2021 Stage prefix: Initial diagnosis Multigene prognostic tests performed: None Histologic grading system: 3 grade system   05/08/2021 - 05/04/2022 Chemotherapy   Adjuvant chemotherapy Paclitaxel + Trastuzumab q7d / Trastuzumab q21d      10/01/2021 -  Radiation Therapy    adjuvant radiation of the breast   10/15/2021 -  Anti-estrogen oral therapy   started on tamoxifen 20 mg daily.   02/15/2022 Echocardiogram   Left ventricular ejection fraction, by estimation, is 60 to 65%.   03/10/2022 Mammogram   Bilateral diagnostic mammogram showed No mammographic evidence of malignancy bilaterally.    Genetic Testing   Negative genetic testing. No pathogenic variants identified on the [facility] Common Hereditary Cancers+RNA panel. The report date is 09/21/2022.  The Common Hereditary Cancers Panel + RNA offered by [facility] includes sequencing and/or deletion duplication testing of the following 48 genes: APC*, ATM*, AXIN2, BAP1, BARD1, BMPR1A, BRCA1, BRCA2, BRIP1, CDH1, CDK4, CDKN2A (p14ARF), CDKN2A (p16INK4a), CHEK2, CTNNA1, DICER1*, EPCAM*, FH*, GREM1*, HOXB13, KIT, MBD4, MEN1*, MLH1*, MSH2*, MSH3*, MSH6*, MUTYH, NF1*, NTHL1, PALB2, PDGFRA, PMS2*, POLD1*, POLE, PTEN*, RAD51C, RAD51D, SDHA*, SDHB, SDHC*, SDHD, SMAD4, SMARCA4, STK11, TP53, TSC1*, TSC2, VHL.    09/22/2022 Imaging   Bilateral breast MRI  1. No MRI evidence of malignancy involving either breast. 2. Expected post lumpectomy changes and post radiation changes involving the LEFT breast    INTERVAL HISTORY Anna Banks is a 48 y.o. female who has above history reviewed by me today presents for follow up visit for chemotherapy for HER2 positive breast cancer  Tolerates tamoxifen 20 mg daily.  Patient has no new complaints.   Review of Systems  Unable to perform ROS: Other (intellectual/Cognitive impairment)  Constitutional:  Negative for appetite change, chills, fatigue and fever.  HENT:   Negative for voice change.   Eyes:   Negative for eye problems.  Respiratory:  Negative for chest tightness and cough.   Gastrointestinal:  Negative for abdominal distention and abdominal pain.  Endocrine: Negative for hot flashes.  Genitourinary:  Negative for difficulty urinating.   Musculoskeletal:  Negative for arthralgias.  Skin:  Negative for rash.  Neurological:  Negative for extremity weakness.  Hematological:  Negative for adenopathy.  Psychiatric/Behavioral:  Negative for confusion.     MEDICAL HISTORY:  Past Medical History:  Diagnosis Date   Down syndrome    Seizure [facility])     SURGICAL HISTORY: Past Surgical History:  Procedure Laterality Date   BREAST BIOPSY Left 03/10/2021   [place] BX,ribbon clip, IMC   BREAST LUMPECTOMY,RADIO FREQ LOCALIZER,AXILLARY SENTINEL LYMPH NODE BIOPSY Left 04/09/2021   Procedure: BREAST LUMPECTOMY,RADIO FREQ LOCALIZER,AXILLARY SENTINEL LYMPH NODE BIOPSY;  Surgeon: Sung Amabile, DO;  Location: [facility] ORS;  Service: General;  Laterality: Left;    SOCIAL HISTORY: Social History   Socioeconomic History   Marital status: Single    Spouse name: Not on file   Number of children: Not on file   Years of education: Not on file  Highest education level: Not on file  Occupational History   Not on file  Tobacco Use   Smoking status: Never   Smokeless tobacco: Never  Vaping Use   Vaping status: Never Used  Substance and Sexual Activity   Alcohol use: Never   Drug use: Not Currently   Sexual activity: Not on file  Other Topics Concern   Not on file  Social History Narrative   Not on file   Social Drivers of Health   Financial Resource Strain: Not on file  Food Insecurity: Not on file  Transportation Needs: Not on file  Physical Activity: Not on file  Stress: Not on file  Social Connections: Not on file  Intimate Partner Violence: Not on file    FAMILY HISTORY: Family History  Problem Relation Age of Onset   Breast cancer Neg Hx     ALLERGIES:  has no known  allergies.  MEDICATIONS:  Current Outpatient Medications  Medication Sig Dispense Refill   acetaminophen (TYLENOL) 325 MG tablet Take 325-650 mg by mouth every 6 (six) hours as needed for moderate pain.     ethosuximide (ZARONTIN) 250 MG capsule Take by mouth.     omeprazole (PRILOSEC) 20 MG capsule Take 1 capsule by mouth daily.     ondansetron (ZOFRAN) 8 MG tablet Take 1 tablet (8 mg total) by mouth 2 (two) times daily as needed (Nausea or vomiting). 30 tablet 1   VITAMIN D PO Take 1 capsule by mouth daily.     tamoxifen (NOLVADEX) 20 MG tablet Take 1 tablet (20 mg total) by mouth daily. 90 tablet 3   No current facility-administered medications for this visit.     PHYSICAL EXAMINATION: ECOG PERFORMANCE STATUS: 0 - Asymptomatic Vitals:   12/08/23 1450  BP: 123/85  Pulse: 96  Resp: 20  Temp: 97.8 F (36.6 C)  SpO2: 100%   Filed Weights   12/08/23 1450  Weight: 136 lb 11.2 oz (62 kg)    Physical Exam Constitutional:      General: She is not in acute distress. HENT:     Head: Normocephalic and atraumatic.  Eyes:     General: No scleral icterus. Cardiovascular:     Rate and Rhythm: Regular rhythm.     Heart sounds: Normal heart sounds.  Pulmonary:     Effort: Pulmonary effort is normal. No respiratory distress.     Breath sounds: No wheezing.  Abdominal:     General: Bowel sounds are normal. There is no distension.     Palpations: Abdomen is soft.  Musculoskeletal:        General: No deformity. Normal range of motion.     Cervical back: Normal range of motion and neck supple.  Skin:    General: Skin is warm and dry.     Findings: No erythema or rash.  Neurological:     Mental Status: She is alert. Mental status is at baseline.     Cranial Nerves: No cranial nerve deficit.     Coordination: Coordination normal.  Psychiatric:        Mood and Affect: Mood normal.      LABORATORY DATA:  I have reviewed the data as listed Lab Results  Component Value Date    WBC 5.5 12/08/2023   HGB 13.4 12/08/2023   HCT 40.4 12/08/2023   MCV 91.6 12/08/2023   PLT 258 12/08/2023   Recent Labs    06/02/23 1425 12/08/23 1435  NA 138 137  K 3.3* 3.6  CL 105 102  CO2 22 23  GLUCOSE 160* 111*  BUN 11 9  CREATININE 0.72 0.64  CALCIUM 9.3 9.5  GFRNONAA >60 >60  PROT 8.3* 7.8  ALBUMIN 4.2 4.1  AST 45* 35  ALT 35 30  ALKPHOS 56 56  BILITOT 0.4 0.6   Iron/TIBC/Ferritin/ %Sat No results found for: "IRON", "TIBC", "FERRITIN", "IRONPCTSAT"    RADIOGRAPHIC STUDIES: I have personally reviewed the radiological images as listed and agreed with the findings in the report. No results found.

## 2023-12-08 NOTE — Assessment & Plan Note (Addendum)
 Recommend her to follow up with Gyn for annual pelvic examination-[last seen by gynecology Dr. Dalbert Garnet on 12/03/2021]. She is overdue for gynecology follow-up.  Patient is provided with gynecology office contact and urged to reestablish care.

## 2023-12-09 LAB — CANCER ANTIGEN 27.29: CA 27.29: 37.7 U/mL (ref 0.0–38.6)

## 2023-12-09 LAB — CANCER ANTIGEN 15-3: CA 15-3: 26.3 U/mL — ABNORMAL HIGH (ref 0.0–25.0)

## 2024-03-14 ENCOUNTER — Ambulatory Visit
Admission: RE | Admit: 2024-03-14 | Discharge: 2024-03-14 | Disposition: A | Discharge status: Home / Self Care | Source: Ambulatory Visit | Attending: Oncology | Admitting: Oncology

## 2024-03-14 DIAGNOSIS — Z1231 Encounter for screening mammogram for malignant neoplasm of breast: Secondary | ICD-10-CM | POA: Diagnosis present

## 2024-06-07 ENCOUNTER — Inpatient Hospital Stay: Attending: Oncology

## 2024-06-07 ENCOUNTER — Inpatient Hospital Stay (HOSPITAL_BASED_OUTPATIENT_CLINIC_OR_DEPARTMENT_OTHER): Admitting: Oncology

## 2024-06-07 ENCOUNTER — Encounter: Payer: Self-pay | Admitting: Oncology

## 2024-06-07 VITALS — BP 119/73 | HR 96 | Temp 97.8°F | Resp 18 | Wt 133.5 lb

## 2024-06-07 DIAGNOSIS — Z17410 Hormone receptor positive with human epidermal growth factor receptor 2 positive status: Secondary | ICD-10-CM | POA: Insufficient documentation

## 2024-06-07 DIAGNOSIS — Z79810 Long term (current) use of selective estrogen receptor modulators (SERMs): Secondary | ICD-10-CM | POA: Insufficient documentation

## 2024-06-07 DIAGNOSIS — C50912 Malignant neoplasm of unspecified site of left female breast: Secondary | ICD-10-CM | POA: Insufficient documentation

## 2024-06-07 DIAGNOSIS — C50919 Malignant neoplasm of unspecified site of unspecified female breast: Secondary | ICD-10-CM | POA: Diagnosis not present

## 2024-06-07 LAB — CMP (CANCER CENTER ONLY)
ALT: 23 U/L (ref 0–44)
AST: 31 U/L (ref 15–41)
Albumin: 4.1 g/dL (ref 3.5–5.0)
Alkaline Phosphatase: 60 U/L (ref 38–126)
Anion gap: 9 (ref 5–15)
BUN: 13 mg/dL (ref 6–20)
CO2: 23 mmol/L (ref 22–32)
Calcium: 9.4 mg/dL (ref 8.9–10.3)
Chloride: 104 mmol/L (ref 98–111)
Creatinine: 0.7 mg/dL (ref 0.44–1.00)
GFR, Estimated: >60 mL/min (ref >60)
Glucose, Bld: 99 mg/dL (ref 70–99)
Potassium: 3.8 mmol/L (ref 3.5–5.1)
Sodium: 136 mmol/L (ref 135–145)
Total Bilirubin: 0.4 mg/dL (ref 0.0–1.2)
Total Protein: 8 g/dL (ref 6.5–8.1)

## 2024-06-07 LAB — CBC WITH DIFFERENTIAL (CANCER CENTER ONLY)
Abs Immature Granulocytes: 0.01 K/uL (ref 0.00–0.07)
Basophils Absolute: 0 K/uL (ref 0.0–0.1)
Basophils Relative: 1 %
Eosinophils Absolute: 0.3 K/uL (ref 0.0–0.5)
Eosinophils Relative: 4 %
HCT: 40.4 % (ref 36.0–46.0)
Hemoglobin: 13.7 g/dL (ref 12.0–15.0)
Immature Granulocytes: 0 %
Lymphocytes Relative: 49 %
Lymphs Abs: 3.4 K/uL (ref 0.7–4.0)
MCH: 30.6 pg (ref 26.0–34.0)
MCHC: 33.9 g/dL (ref 30.0–36.0)
MCV: 90.2 fL (ref 80.0–100.0)
Monocytes Absolute: 0.6 K/uL (ref 0.1–1.0)
Monocytes Relative: 8 %
Neutro Abs: 2.7 K/uL (ref 1.7–7.7)
Neutrophils Relative %: 38 %
Platelet Count: 318 K/uL (ref 150–400)
RBC: 4.48 MIL/uL (ref 3.87–5.11)
RDW: 12.6 % (ref 11.5–15.5)
WBC Count: 7 K/uL (ref 4.0–10.5)
nRBC: 0 % (ref 0.0–0.2)

## 2024-06-07 MED ORDER — TAMOXIFEN CITRATE 20 MG PO TABS: 20.0000 mg | ORAL_TABLET | Freq: Every day | ORAL | 3 refills | Status: AC

## 2024-06-07 NOTE — Assessment & Plan Note (Signed)
Left invasive carcinoma of breast,pT1c pN0 cM0 status postlumpectomy and sentinel lymph node biopsy- HER2 positive, ER 11-50% positive,  PR 1-10% positive.-,  s/p adjuvant chemotherapy Taxol weekly x12, finnish 1 year of maintenance trastuzumab.  Genetic testing negative.  Lab results reviewed and discussed with patient continue tamoxifen 20 mg daily.   Continue annual MRI breasts and annual mammogram

## 2024-06-07 NOTE — Progress Notes (Signed)
 Hematology/Oncology Progress note [phone] Fax:(336) [phone]     CHIEF COMPLAINTS/REASON FOR VISIT:  Follow up for left Triple positive breast cancer   ASSESSMENT & PLAN:   Cancer Staging  Invasive carcinoma of breast (HCC) Staging form: Breast, AJCC 8th Edition - Pathologic stage from 04/22/2021: Stage IA (pT1c, pN0, cM0, G2, ER+, PR+, HER2+) - Signed by Babara Call, MD on 04/22/2021   Invasive carcinoma of breast (HCC) Left invasive carcinoma of breast,pT1c pN0 cM0 status postlumpectomy and sentinel lymph node biopsy- HER2 positive, ER 11-50% positive,  PR 1-10% positive.-,  s/p adjuvant chemotherapy Taxol  weekly x12, finnish 1 year of maintenance trastuzumab .  Genetic testing negative.  Lab results reviewed and discussed with patient continue tamoxifen  20 mg daily.  Continue annual MRI breasts and annual mammogram   Long-term current use of tamoxifen  Recommend her to follow up with Gyn for annual pelvic examination-[last seen by gynecology Dr. Verdon on 01/13/24]. Recommend calcium and vitamin D supplementation.   Orders Placed This Encounter  Procedures   MR BREAST BILATERAL W WO CONTRAST INC CAD    Standing Status:   Future    Expected Date:   09/14/2024    Expiration Date:   06/07/2025    If indicated for the ordered procedure, I authorize the administration of contrast media per Radiology protocol:   Yes    What is the patient's sedation requirement?:   No Sedation    Does the patient have a pacemaker or implanted devices?:   No    Radiology Contrast Protocol - do NOT remove file path:   [email or web address]    Preferred imaging location?:   [facility] (table limit - 500lbs)   CBC (Cancer Center Only)    Standing Status:   Future    Expected Date:   12/05/2024    Expiration Date:   03/05/2025   Cancer antigen 15-3    Standing Status:   Future    Expected Date:   12/05/2024    Expiration Date:   03/05/2025   CMP  (Cancer Center only)    Standing Status:   Future    Expected Date:   12/05/2024    Expiration Date:   03/05/2025   CA 27.29 (SERIAL MONITOR)    Standing Status:   Future    Expected Date:   12/05/2024    Expiration Date:   03/05/2025    Online [place] language interpreter service was utilized during the entire encounter.   Follow up in 6 months All questions were answered. The patient knows to call the clinic with any problems, questions or concerns.  Call Babara, MD, PhD [facility] Hematology Oncology 06/07/2024      HISTORY OF PRESENTING ILLNESS:   Anna Banks is a  48 y.o.  female presents for HER2 positive breast cancer.  Oncology History  Invasive carcinoma of breast (HCC)  03/23/2021 Initial Diagnosis   Invasive carcinoma of breast  Menarche 48 years of age. G0P0. No child, no previous pregnancy. Per her sister, patient is not sexually active No prior use of birth control pills or hormone replacement therapy. Denies previous history of biopsy.   02/16/2021, screening mammogram showed calcification in the right breast needs further evaluation.  Left breast possible mass with distortion requires further evaluation. 02/26/2021, left breast showed a 10 mm indeterminate mass, 2:00, 8 cm from nipple.  No mammographic evidence of malignancy in the right breast.  No left axillary lymphadenopathy. 03/10/2021, patient underwent ultrasound-guided biopsy of left breast mass.  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  ER low positive [1-10%], PR positive [1-10%], HER2 positive by IHC 3+   # 04/09/2021, status postlumpectomy and sentinel lymph node biopsy. Invasive mammary carcinoma, no special type, 4 lymph nodes all negative for metastatic carcinoma.  DCIS present.  LVI not identified.Grade 2, all margins negative for invasive carcinoma.pT1c pN0, ER [staining was repeated on surgical specimen] [11-50%]+ PR  [1-10%]+, HER 2+     04/22/2021 Cancer Staging   Staging form: Breast, AJCC 8th Edition - Pathologic stage from 04/22/2021: Stage IA (pT1c, pN0, cM0, G2, ER+, PR+, HER2+) - Signed by Babara Call, MD on 04/22/2021 Stage prefix: Initial diagnosis Multigene prognostic tests performed: None Histologic grading system: 3 grade system   05/08/2021 - 05/04/2022 Chemotherapy   Adjuvant chemotherapy Paclitaxel  + Trastuzumab  q7d / Trastuzumab  q21d      10/01/2021 -  Radiation Therapy    adjuvant radiation of the breast   10/15/2021 -  Anti-estrogen oral therapy   started on tamoxifen  20 mg daily.   02/15/2022 Echocardiogram   Left ventricular ejection fraction, by estimation, is 60 to 65%.   03/10/2022 Mammogram   Bilateral diagnostic mammogram showed No mammographic evidence of malignancy bilaterally.    Genetic Testing   Negative genetic testing. No pathogenic variants identified on the [facility] Common Hereditary Cancers+RNA panel. The report date is 09/21/2022.  The Common Hereditary Cancers Panel + RNA offered by [facility] includes sequencing and/or deletion duplication testing of the following 48 genes: APC*, ATM*, AXIN2, BAP1, BARD1, BMPR1A, BRCA1, BRCA2, BRIP1, CDH1, CDK4, CDKN2A (p14ARF), CDKN2A (p16INK4a), CHEK2, CTNNA1, DICER1*, EPCAM*, FH*, GREM1*, HOXB13, KIT, MBD4, MEN1*, MLH1*, MSH2*, MSH3*, MSH6*, MUTYH, NF1*, NTHL1, PALB2, PDGFRA, PMS2*, POLD1*, POLE, PTEN*, RAD51C, RAD51D, SDHA*, SDHB, SDHC*, SDHD, SMAD4, SMARCA4, STK11, TP53, TSC1*, TSC2, VHL.    09/22/2022 Imaging   Bilateral breast MRI  1. No MRI evidence of malignancy involving either breast. 2. Expected post lumpectomy changes and post radiation changes involving the LEFT breast    INTERVAL HISTORY Anna Banks is a 48 y.o. female who has above history reviewed by me today presents for follow up visit for chemotherapy for HER2 positive breast cancer  Tolerates tamoxifen  20 mg daily.  Patient has no new complaints.   Review of  Systems  Unable to perform ROS: Other (intellectual/Cognitive impairment)  Constitutional:  Negative for appetite change, chills, fatigue and fever.  HENT:   Negative for hearing loss and voice change.   Eyes:  Negative for eye problems.  Respiratory:  Negative for cough.   Cardiovascular:  Negative for chest pain.  Gastrointestinal:  Negative for abdominal distention, abdominal pain and blood in stool.  Endocrine: Negative for hot flashes.  Genitourinary:  Negative for difficulty urinating and frequency.   Musculoskeletal:  Negative for arthralgias.  Skin:  Negative for itching and rash.  Neurological:  Negative for extremity weakness.  Hematological:  Negative for adenopathy.  Psychiatric/Behavioral:  Negative for confusion.     MEDICAL HISTORY:  Past Medical History:  Diagnosis Date   Down syndrome    Seizure [facility])     SURGICAL HISTORY: Past Surgical History:  Procedure Laterality Date   BREAST BIOPSY Left 03/10/2021   US  BX,ribbon clip, IMC   BREAST LUMPECTOMY,RADIO FREQ LOCALIZER,AXILLARY SENTINEL LYMPH NODE BIOPSY Left 04/09/2021   Procedure: BREAST LUMPECTOMY,RADIO FREQ LOCALIZER,AXILLARY SENTINEL LYMPH NODE BIOPSY;  Surgeon: Tye Millet, DO;  Location: [facility] ORS;  Service: General;  Laterality: Left;    SOCIAL HISTORY: Social History   Socioeconomic History   Marital status: Single    Spouse name: Not on file   Number of children: Not on file   Years of education: Not on file   Highest education level: Not on file  Occupational History   Not on file  Tobacco Use   Smoking status: Never   Smokeless tobacco: Never  Vaping Use   Vaping status: Never Used  Substance and Sexual Activity   Alcohol use: Never   Drug use: Not Currently   Sexual activity: Not on file  Other Topics Concern   Not on file  Social History Narrative   Not on file   Social Drivers of Health   Financial Resource Strain: Low Risk  (01/13/2024)   Received from [facility]   Overall Financial Resource Strain (CARDIA)    Difficulty of Paying Living Expenses: Not hard at all  Food Insecurity: No Food Insecurity (01/13/2024)   Received from [facility]   Hunger Vital Sign    Within the past 12 months, you worried that your food would run out before you got the money to buy more.: Never true    Within the past 12 months, the food you bought just didn't last and you didn't have money to get more.: Never true  Transportation Needs: No Transportation Needs (01/13/2024)   Received from [facility] - Transportation    In the past 12 months, has lack of transportation kept you from medical appointments or from getting medications?: No    Lack of Transportation (Non-Medical): No  Physical Activity: Not on file  Stress: Not on file  Social Connections: Not on file  Intimate Partner Violence: Not on file    FAMILY HISTORY: Family History  Problem Relation Age of Onset   Breast cancer Neg Hx     ALLERGIES:  is allergic to shrimp extract.  MEDICATIONS:  Current Outpatient Medications  Medication Sig Dispense Refill   acetaminophen  (TYLENOL ) 325 MG tablet Take 325-650 mg by mouth every 6 (six) hours as needed for moderate pain.     ethosuximide (ZARONTIN) 250 MG capsule Take by mouth.     omeprazole  (PRILOSEC) 20 MG capsule Take 1 capsule by mouth daily.     ondansetron  (ZOFRAN ) 8 MG tablet Take 1 tablet (8 mg total) by mouth 2 (two) times daily as needed (Nausea or vomiting). 30 tablet 1   VITAMIN D PO Take 1 capsule by mouth daily.     tamoxifen  (NOLVADEX ) 20 MG tablet Take 1 tablet (20 mg total) by mouth daily. 90 tablet 3   No current facility-administered medications for this visit.     PHYSICAL EXAMINATION: ECOG PERFORMANCE STATUS: 0 - Asymptomatic Vitals:   06/07/24 1447  BP: 119/73  Pulse: 96  Resp: 18  Temp: 97.8 F (36.6 C)  SpO2: 98%   Filed Weights   06/07/24 1447  Weight: 133 lb 8 oz  (60.6 kg)    Physical Exam Constitutional:      General: She is not in acute distress. HENT:     Head: Normocephalic and atraumatic.  Eyes:     General: No scleral icterus. Cardiovascular:     Rate and Rhythm: Regular rhythm.     Heart sounds: Normal heart sounds.  Pulmonary:     Effort: Pulmonary effort is normal. No respiratory distress.     Breath sounds: No wheezing.  Abdominal:  General: Bowel sounds are normal. There is no distension.     Palpations: Abdomen is soft.  Musculoskeletal:        General: No deformity. Normal range of motion.     Cervical back: Normal range of motion and neck supple.  Skin:    General: Skin is warm and dry.     Findings: No erythema or rash.  Neurological:     Mental Status: She is alert. Mental status is at baseline.     Cranial Nerves: No cranial nerve deficit.     Coordination: Coordination normal.  Psychiatric:        Mood and Affect: Mood normal.    . Breast exam was performed in seated and lying down position. No palpable mass on the right breast.  No discrete palpable mass on left breast.  No palpable lymphadenopathy bilaterally  LABORATORY DATA:  I have reviewed the data as listed Lab Results  Component Value Date   WBC 7.0 06/07/2024   HGB 13.7 06/07/2024   HCT 40.4 06/07/2024   MCV 90.2 06/07/2024   PLT 318 06/07/2024   Recent Labs    12/08/23 1435 06/07/24 1419  NA 137 136  K 3.6 3.8  CL 102 104  CO2 23 23  GLUCOSE 111* 99  BUN 9 13  CREATININE 0.64 0.70  CALCIUM 9.5 9.4  GFRNONAA >60 >60  PROT 7.8 8.0  ALBUMIN 4.1 4.1  AST 35 31  ALT 30 23  ALKPHOS 56 60  BILITOT 0.6 0.4   Iron/TIBC/Ferritin/ %Sat No results found for: IRON, TIBC, FERRITIN, IRONPCTSAT    RADIOGRAPHIC STUDIES: I have personally reviewed the radiological images as listed and agreed with the findings in the report. No results found.

## 2024-06-07 NOTE — Assessment & Plan Note (Addendum)
 Recommend her to follow up with Gyn for annual pelvic examination-[last seen by gynecology Dr. Verdon on 01/13/24]. Recommend calcium and vitamin D supplementation.

## 2024-06-08 LAB — CANCER ANTIGEN 15-3: CA 15-3: 26.7 U/mL — ABNORMAL HIGH (ref 0.0–25.0)

## 2024-06-08 LAB — CANCER ANTIGEN 27.29: CA 27.29: 30.4 U/mL (ref 0.0–38.6)

## 2024-12-05 ENCOUNTER — Ambulatory Visit: Admitting: Oncology

## 2024-12-05 ENCOUNTER — Other Ambulatory Visit

## 2025-01-28 ENCOUNTER — Ambulatory Visit: Admit: 2025-01-28 | Admitting: Gastroenterology

## 2025-01-28 SURGERY — COLONOSCOPY
Anesthesia: General
